# Patient Record
Sex: Male | Born: 1985
Health system: Southern US, Community
[De-identification: ages and names within clinical notes are randomized; demographics above are authoritative.]

## PROBLEM LIST (undated history)

## (undated) DIAGNOSIS — F1910 Other psychoactive substance abuse, uncomplicated: Secondary | ICD-10-CM

## (undated) DIAGNOSIS — F419 Anxiety disorder, unspecified: Secondary | ICD-10-CM

## (undated) HISTORY — DX: Anxiety disorder, unspecified: F41.9

## (undated) HISTORY — DX: Other psychoactive substance abuse, uncomplicated: F19.10

---

## 2010-05-23 ENCOUNTER — Encounter: Admission: RE | Admit: 2010-05-23 | Discharge: 2010-05-23 | Payer: Self-pay | Admitting: Family Medicine

## 2010-06-30 ENCOUNTER — Encounter: Admission: RE | Admit: 2010-06-30 | Discharge: 2010-06-30 | Payer: Self-pay | Admitting: Family Medicine

## 2016-05-19 ENCOUNTER — Ambulatory Visit (INDEPENDENT_AMBULATORY_CARE_PROVIDER_SITE_OTHER): Payer: BLUE CROSS/BLUE SHIELD | Admitting: Physician Assistant

## 2016-05-19 VITALS — BP 120/80 | HR 94 | Temp 98.5°F | Resp 18 | Ht 68.5 in | Wt 161.0 lb

## 2016-05-19 DIAGNOSIS — Z1389 Encounter for screening for other disorder: Secondary | ICD-10-CM | POA: Diagnosis not present

## 2016-05-19 DIAGNOSIS — Z130 Encounter for screening for diseases of the blood and blood-forming organs and certain disorders involving the immune mechanism: Secondary | ICD-10-CM

## 2016-05-19 DIAGNOSIS — Z0000 Encounter for general adult medical examination without abnormal findings: Secondary | ICD-10-CM

## 2016-05-19 DIAGNOSIS — Z114 Encounter for screening for human immunodeficiency virus [HIV]: Secondary | ICD-10-CM | POA: Diagnosis not present

## 2016-05-19 DIAGNOSIS — Z1159 Encounter for screening for other viral diseases: Secondary | ICD-10-CM | POA: Diagnosis not present

## 2016-05-19 NOTE — Progress Notes (Signed)
   05/19/2016 5:45 PM   DOB: 1986-03-27 / MRN: [id number]  SUBJECTIVE:  Staci AcostaBenjamin V Majer is a 30 y.o. male presenting for an annual physical.  He has a history of IV drug use and is under buprenorphine therapy at this time. He would like his labs forwarded to Dr. Chandra BatchJoe Orsini. He has never been tested for HIV.   He has No Known Allergies.   He  has a past medical history of Anxiety and Substance abuse.    He  reports that he has never smoked. He does not have any smokeless tobacco history on file. He reports that he drinks alcohol. He reports that he does not use illicit drugs. He  has no sexual activity history on file. The patient  has no past surgical history on file.  His family history is not on file.  Review of Systems  Constitutional: Negative for fever and chills.  Gastrointestinal: Negative for nausea.  Skin: Negative for itching and rash.  Neurological: Negative for dizziness and headaches.    Problem list and medications reviewed and updated by myself where necessary, and exist elsewhere in the encounter.   OBJECTIVE:  BP 120/80 mmHg  Pulse 94  Temp(Src) 98.5 F (36.9 C) (Oral)  Resp 18  Ht 5' 8.5" (1.74 m)  Wt 161 lb (73.029 kg)  BMI 24.12 kg/m2  SpO2 99%  Physical Exam  Constitutional: He is oriented to person, place, and time. He appears well-developed. He does not appear ill.  Eyes: Conjunctivae and EOM are normal. Pupils are equal, round, and reactive to light.  Cardiovascular: Normal rate.   Pulmonary/Chest: Effort normal.  Abdominal: He exhibits no distension.  Musculoskeletal: Normal range of motion.  Neurological: He is alert and oriented to person, place, and time. No cranial nerve deficit. Coordination normal.  Skin: Skin is warm and dry. He is not diaphoretic.  Psychiatric: He has a normal mood and affect.  Nursing note and vitals reviewed.   No results found for this or any previous visit (from the past 72 hour(s)).  No results  found.  ASSESSMENT AND PLAN  Sharlet SalinaBenjamin was seen today for annual exam.  Diagnoses and all orders for this visit:  Annual physical exam: He declines the TD. He would like his labs forwarded to Dr. Chandra BatchJoe Orsini at fax [phone]   Screening for HIV (human immunodeficiency virus) -     HIV antibody  Need for hepatitis C screening test -     Hepatitis C antibody  Need for hepatitis B screening test -     Hepatitis B surface antigen  Screening for deficiency anemia -     CBC  Screening for nephropathy -     COMPLETE METABOLIC PANEL WITH GFR   The patient was advised to call or return to clinic if he does not see an improvement in symptoms, or to seek the care of the closest emergency department if he worsens with the above plan.   Deliah BostonMichael Clark, MHS, PA-C [facility] 05/19/2016 5:45 PM

## 2016-05-19 NOTE — Patient Instructions (Signed)
     IF you received an x-ray today, you will receive an invoice from [facility]. Please contact [facility] at [phone] with questions or concerns regarding your invoice.   IF you received labwork today, you will receive an invoice from [facility]. Please contact [facility] at [phone] with questions or concerns regarding your invoice.   Our billing staff will not be able to assist you with questions regarding bills from these companies.  You will be contacted with the lab results as soon as they are available. The fastest way to get your results is to activate your My Chart account. Instructions are located on the last page of this paperwork. If you have not heard from us regarding the results in 2 weeks, please contact this office.      

## 2016-05-20 LAB — COMPLETE METABOLIC PANEL WITH GFR
ALBUMIN: 4.6 g/dL (ref 3.6–5.1)
ALK PHOS: 75 U/L (ref 40–115)
ALT: 17 U/L (ref 9–46)
AST: 19 U/L (ref 10–40)
BILIRUBIN TOTAL: 0.4 mg/dL (ref 0.2–1.2)
BUN: 16 mg/dL (ref 7–25)
CALCIUM: 9.4 mg/dL (ref 8.6–10.3)
CO2: 27 mmol/L (ref 20–31)
Chloride: 101 mmol/L (ref 98–110)
Creat: 0.92 mg/dL (ref 0.60–1.35)
GFR, Est African American: >89 mL/min (ref >=60)
Glucose, Bld: 105 mg/dL — ABNORMAL HIGH (ref 65–99)
POTASSIUM: 4.3 mmol/L (ref 3.5–5.3)
Sodium: 140 mmol/L (ref 135–146)
Total Protein: 7.4 g/dL (ref 6.1–8.1)

## 2016-05-20 LAB — HIV ANTIBODY (ROUTINE TESTING W REFLEX): HIV 1&2 Ab, 4th Generation: NONREACTIVE

## 2016-05-20 LAB — CBC
HEMATOCRIT: 41.6 % (ref 38.5–50.0)
HEMOGLOBIN: 14.4 g/dL (ref 13.2–17.1)
MCH: 32.1 pg (ref 27.0–33.0)
MCHC: 34.6 g/dL (ref 32.0–36.0)
MCV: 92.7 fL (ref 80.0–100.0)
MPV: 10.3 fL (ref 7.5–12.5)
Platelets: 221 10*3/uL (ref 140–400)
RBC: 4.49 MIL/uL (ref 4.20–5.80)
RDW: 13.3 % (ref 11.0–15.0)
WBC: 7.3 10*3/uL (ref 3.8–10.8)

## 2016-05-20 LAB — HEPATITIS C ANTIBODY: HCV Ab: NEGATIVE

## 2016-05-20 LAB — HEPATITIS B SURFACE ANTIGEN: Hepatitis B Surface Ag: NEGATIVE

## 2016-05-27 ENCOUNTER — Encounter: Payer: Self-pay | Admitting: *Deleted

## 2016-07-30 DIAGNOSIS — F410 Panic disorder [episodic paroxysmal anxiety] without agoraphobia: Secondary | ICD-10-CM | POA: Diagnosis not present

## 2016-07-30 DIAGNOSIS — F900 Attention-deficit hyperactivity disorder, predominantly inattentive type: Secondary | ICD-10-CM | POA: Diagnosis not present

## 2017-01-21 DIAGNOSIS — F900 Attention-deficit hyperactivity disorder, predominantly inattentive type: Secondary | ICD-10-CM | POA: Diagnosis not present

## 2017-01-21 DIAGNOSIS — F411 Generalized anxiety disorder: Secondary | ICD-10-CM | POA: Diagnosis not present

## 2017-07-23 DIAGNOSIS — F900 Attention-deficit hyperactivity disorder, predominantly inattentive type: Secondary | ICD-10-CM | POA: Diagnosis not present

## 2017-12-08 DIAGNOSIS — M9903 Segmental and somatic dysfunction of lumbar region: Secondary | ICD-10-CM | POA: Diagnosis not present

## 2017-12-08 DIAGNOSIS — M9901 Segmental and somatic dysfunction of cervical region: Secondary | ICD-10-CM | POA: Diagnosis not present

## 2017-12-08 DIAGNOSIS — M62830 Muscle spasm of back: Secondary | ICD-10-CM | POA: Diagnosis not present

## 2017-12-08 DIAGNOSIS — M9905 Segmental and somatic dysfunction of pelvic region: Secondary | ICD-10-CM | POA: Diagnosis not present

## 2017-12-08 DIAGNOSIS — M9902 Segmental and somatic dysfunction of thoracic region: Secondary | ICD-10-CM | POA: Diagnosis not present

## 2017-12-10 DIAGNOSIS — M9901 Segmental and somatic dysfunction of cervical region: Secondary | ICD-10-CM | POA: Diagnosis not present

## 2017-12-10 DIAGNOSIS — M9905 Segmental and somatic dysfunction of pelvic region: Secondary | ICD-10-CM | POA: Diagnosis not present

## 2017-12-10 DIAGNOSIS — M9902 Segmental and somatic dysfunction of thoracic region: Secondary | ICD-10-CM | POA: Diagnosis not present

## 2017-12-10 DIAGNOSIS — M9903 Segmental and somatic dysfunction of lumbar region: Secondary | ICD-10-CM | POA: Diagnosis not present

## 2018-01-14 DIAGNOSIS — F900 Attention-deficit hyperactivity disorder, predominantly inattentive type: Secondary | ICD-10-CM | POA: Diagnosis not present

## 2018-01-14 DIAGNOSIS — F4010 Social phobia, unspecified: Secondary | ICD-10-CM | POA: Diagnosis not present

## 2018-01-21 DIAGNOSIS — M9902 Segmental and somatic dysfunction of thoracic region: Secondary | ICD-10-CM | POA: Diagnosis not present

## 2018-01-21 DIAGNOSIS — M9903 Segmental and somatic dysfunction of lumbar region: Secondary | ICD-10-CM | POA: Diagnosis not present

## 2018-01-21 DIAGNOSIS — M9905 Segmental and somatic dysfunction of pelvic region: Secondary | ICD-10-CM | POA: Diagnosis not present

## 2018-01-21 DIAGNOSIS — M9901 Segmental and somatic dysfunction of cervical region: Secondary | ICD-10-CM | POA: Diagnosis not present

## 2018-01-31 DIAGNOSIS — M9902 Segmental and somatic dysfunction of thoracic region: Secondary | ICD-10-CM | POA: Diagnosis not present

## 2018-01-31 DIAGNOSIS — M9903 Segmental and somatic dysfunction of lumbar region: Secondary | ICD-10-CM | POA: Diagnosis not present

## 2018-01-31 DIAGNOSIS — M9901 Segmental and somatic dysfunction of cervical region: Secondary | ICD-10-CM | POA: Diagnosis not present

## 2018-01-31 DIAGNOSIS — M9905 Segmental and somatic dysfunction of pelvic region: Secondary | ICD-10-CM | POA: Diagnosis not present

## 2018-02-07 DIAGNOSIS — M9905 Segmental and somatic dysfunction of pelvic region: Secondary | ICD-10-CM | POA: Diagnosis not present

## 2018-02-07 DIAGNOSIS — M9901 Segmental and somatic dysfunction of cervical region: Secondary | ICD-10-CM | POA: Diagnosis not present

## 2018-02-07 DIAGNOSIS — M9903 Segmental and somatic dysfunction of lumbar region: Secondary | ICD-10-CM | POA: Diagnosis not present

## 2018-02-07 DIAGNOSIS — M9902 Segmental and somatic dysfunction of thoracic region: Secondary | ICD-10-CM | POA: Diagnosis not present

## 2018-03-31 ENCOUNTER — Ambulatory Visit: Payer: BLUE CROSS/BLUE SHIELD | Admitting: Family Medicine

## 2018-03-31 ENCOUNTER — Other Ambulatory Visit: Payer: Self-pay

## 2018-03-31 ENCOUNTER — Encounter (HOSPITAL_COMMUNITY): Payer: Self-pay | Admitting: Emergency Medicine

## 2018-03-31 ENCOUNTER — Emergency Department (HOSPITAL_COMMUNITY)
Admission: EM | Admit: 2018-03-31 | Discharge: 2018-03-31 | Disposition: A | Discharge status: Home / Self Care | Payer: BLUE CROSS/BLUE SHIELD | Attending: Emergency Medicine | Admitting: Emergency Medicine

## 2018-03-31 ENCOUNTER — Encounter: Payer: Self-pay | Admitting: Family Medicine

## 2018-03-31 VITALS — BP 128/82 | HR 73 | Temp 98.4°F | Resp 18 | Ht 68.5 in | Wt 150.0 lb

## 2018-03-31 DIAGNOSIS — F0781 Postconcussional syndrome: Secondary | ICD-10-CM | POA: Diagnosis not present

## 2018-03-31 DIAGNOSIS — H8112 Benign paroxysmal vertigo, left ear: Secondary | ICD-10-CM | POA: Diagnosis not present

## 2018-03-31 DIAGNOSIS — F17200 Nicotine dependence, unspecified, uncomplicated: Secondary | ICD-10-CM

## 2018-03-31 DIAGNOSIS — Z79899 Other long term (current) drug therapy: Secondary | ICD-10-CM | POA: Insufficient documentation

## 2018-03-31 DIAGNOSIS — R51 Headache: Secondary | ICD-10-CM | POA: Diagnosis not present

## 2018-03-31 DIAGNOSIS — S069X0A Unspecified intracranial injury without loss of consciousness, initial encounter: Secondary | ICD-10-CM

## 2018-03-31 DIAGNOSIS — H8111 Benign paroxysmal vertigo, right ear: Secondary | ICD-10-CM | POA: Diagnosis not present

## 2018-03-31 DIAGNOSIS — G44319 Acute post-traumatic headache, not intractable: Secondary | ICD-10-CM | POA: Diagnosis not present

## 2018-03-31 DIAGNOSIS — G44309 Post-traumatic headache, unspecified, not intractable: Secondary | ICD-10-CM | POA: Diagnosis not present

## 2018-03-31 DIAGNOSIS — S0990XA Unspecified injury of head, initial encounter: Secondary | ICD-10-CM | POA: Diagnosis not present

## 2018-03-31 DIAGNOSIS — R29818 Other symptoms and signs involving the nervous system: Secondary | ICD-10-CM

## 2018-03-31 DIAGNOSIS — R0981 Nasal congestion: Secondary | ICD-10-CM | POA: Diagnosis not present

## 2018-03-31 MED ORDER — PROMETHAZINE HCL 25 MG PO TABS: 25.0000 mg | ORAL_TABLET | Freq: Three times a day (TID) | ORAL | 0 refills | Status: DC | PRN

## 2018-03-31 MED ORDER — MECLIZINE HCL 25 MG PO TABS
25.0000 mg | ORAL_TABLET | Freq: Once | ORAL | Status: AC
  Administered 2018-03-31: 25 mg via ORAL
  Filled 2018-03-31: qty 1

## 2018-03-31 MED ORDER — AMITRIPTYLINE HCL 25 MG PO TABS: 25.0000 mg | ORAL_TABLET | Freq: Every day | ORAL | 0 refills | Status: DC

## 2018-03-31 MED ORDER — MOMETASONE FUROATE 50 MCG/ACT NA SUSP: 2.0000 | Freq: Every day | NASAL | 1 refills | Status: DC

## 2018-03-31 MED ORDER — MECLIZINE HCL 25 MG PO TABS: 25.0000 mg | ORAL_TABLET | Freq: Three times a day (TID) | ORAL | 0 refills | Status: DC

## 2018-03-31 MED ORDER — IBUPROFEN 800 MG PO TABS: 800.0000 mg | ORAL_TABLET | Freq: Three times a day (TID) | ORAL | 0 refills | Status: DC

## 2018-03-31 MED ORDER — ONDANSETRON 4 MG PO TBDP: 4.0000 mg | ORAL_TABLET | Freq: Three times a day (TID) | ORAL | 0 refills | Status: DC | PRN

## 2018-03-31 MED ORDER — AMITRIPTYLINE HCL 25 MG PO TABS: 25.0000 mg | ORAL_TABLET | Freq: Every day | ORAL | 1 refills | Status: DC

## 2018-03-31 MED ORDER — MECLIZINE HCL 25 MG PO TABS: 25.0000 mg | ORAL_TABLET | Freq: Three times a day (TID) | ORAL | 0 refills | Status: DC | PRN

## 2018-03-31 MED ORDER — IBUPROFEN 800 MG PO TABS
800.0000 mg | ORAL_TABLET | Freq: Once | ORAL | Status: AC
  Administered 2018-03-31: 800 mg via ORAL
  Filled 2018-03-31: qty 1

## 2018-03-31 MED ORDER — ONDANSETRON 4 MG PO TBDP
4.0000 mg | ORAL_TABLET | Freq: Once | ORAL | Status: AC
Start: 2018-03-31 — End: 2018-03-31
  Administered 2018-03-31: 4 mg via ORAL
  Filled 2018-03-31: qty 1

## 2018-03-31 NOTE — ED Provider Notes (Signed)
[facility]-EMERGENCY DEPT Provider Note   CSN: [id number] Arrival date & time: 03/31/18  2219     History   Chief Complaint Chief Complaint  Patient presents with  . Head Injury    HPI COWAN PILAR is a 32 y.o. male.  The history is provided by the patient, a friend and medical records.  Head Injury       57 y.o. M with hx of anxiety and substance abuse, presenting to the ED for ongoing headaches.  Patient states he had a head injury that occurred approx 3 weeks ago where he slipped on a microphone and struck the right side of his head on the hardwood floor.  No LOC.  States since that time he has not been "right".  States he has been having ongoing headaches, usually generalized, waxing and waning in severity.  States he has been experiencing a lot of intermittent vertigo as well, notably if bending forward where his head is hanging down.  States the room starts spinning and he gets very nauseated.  He has not had any vomiting.  States he has had trouble focusing-- has been more forgetful than normal (ie-- misplacing his keys, phone, forgetting details, etc).  States his family has been extremely worried about him which is causing some increased stress.  This in turn is making his anxiety worse, therefore he has not been sleeping well.  He is not currently on anticoagulation.  He denies any focal numbness/weakness, neck pain, fever, chills, sweats, difficulty walking, slurred speech, facial droop, etc.  He has been able to intermittently work (mows lawns), however has still had to miss several days of work.  States he is only frustrated because his symptoms come in waves (ie-- one day he is fine and able to play hockey, next day he feels bad again).  He did see his PCP ealrier today who wrote him prescriptions for symptomatic control, however did not get to the pharmacy in time to pick them up.  Past Medical History:  Diagnosis Date  . Anxiety   . Substance  abuse (HCC)     There are no active problems to display for this patient.   History reviewed. No pertinent surgical history.      Home Medications    Prior to Admission medications   Medication Sig Start Date End Date Taking? Authorizing Provider  buprenorphine-naloxone (SUBOXONE) 8-2 MG SUBL SL tablet Place 1 tablet under the tongue daily.   Yes [provider]  amitriptyline (ELAVIL) 25 MG tablet Take 1 tablet (25 mg total) by mouth at bedtime. 03/31/18   Sherren Mocha, MD  meclizine (ANTIVERT) 25 MG tablet Take 1 tablet (25 mg total) by mouth 3 (three) times daily. 03/31/18   Sherren Mocha, MD  mometasone (NASONEX) 50 MCG/ACT nasal spray Place 2 sprays into the nose daily. 03/31/18   Sherren Mocha, MD  promethazine (PHENERGAN) 25 MG tablet Take 1 tablet (25 mg total) by mouth every 8 (eight) hours as needed for nausea or vomiting. 03/31/18   Sherren Mocha, MD    Family History No family history on file.  Social History Social History   Tobacco Use  . Smoking status: Never Smoker  . Smokeless tobacco: Never Used  Substance Use Topics  . Alcohol use: Yes    Alcohol/week: 0.0 oz  . Drug use: No     Allergies   Patient has no known allergies.   Review of Systems Review of Systems  Neurological: Positive for dizziness and headaches.  All other systems reviewed and are negative.    Physical Exam Updated Vital Signs BP (!) 149/118 (BP Location: Right Arm)   Pulse 77   Temp 98.2 F (36.8 C) (Oral)   Resp 20   SpO2 100%   Physical Exam  Constitutional: He is oriented to person, place, and time. He appears well-developed and well-nourished. No distress.  HENT:  Head: Normocephalic and atraumatic.  Right Ear: Tympanic membrane and external ear normal.  Left Ear: Tympanic membrane and external ear normal.  Nose: Nose normal.  Mouth/Throat: Uvula is midline, oropharynx is clear and moist and mucous membranes are normal.  Small area of contusion to right  forehead/temple; no skull depression noted; no hemotympanum; no facial deformities  Eyes: Pupils are equal, round, and reactive to light. Conjunctivae and EOM are normal.  Pupils dilated but symmetric and reactive bilaterally  Neck: Normal range of motion and full passive range of motion without pain. Neck supple. No neck rigidity.  No rigidity, no meningismus  Cardiovascular: Normal rate, regular rhythm and normal heart sounds.  No murmur heard. Pulmonary/Chest: Effort normal and breath sounds normal. No respiratory distress. He has no wheezes. He has no rhonchi.  Abdominal: Soft. Bowel sounds are normal. There is no tenderness. There is no guarding.  Musculoskeletal: Normal range of motion. He exhibits no edema.  Neurological: He is alert and oriented to person, place, and time. He has normal strength. He displays no tremor. No cranial nerve deficit or sensory deficit. He displays no seizure activity.  AAOx3, answering questions and following commands appropriately; equal strength UE and LE bilaterally; CN grossly intact; moves all extremities appropriately without ataxia; no focal neuro deficits or facial asymmetry appreciated; speech is clear and goal oriented  Skin: Skin is warm and dry. No rash noted. He is not diaphoretic.  Psychiatric: He has a normal mood and affect. His behavior is normal. Thought content normal.  Nursing note and vitals reviewed.    ED Treatments / Results  Labs (all labs ordered are listed, but only abnormal results are displayed) Labs Reviewed - No data to display  EKG None  Radiology No results found.  Procedures Procedures (including critical care time)  Medications Ordered in ED Medications - No data to display   Initial Impression / Assessment and Plan / ED Course  I have reviewed the triage vital signs and the nursing notes.  Pertinent labs & imaging results that were available during my care of the patient were reviewed by me and considered  in my medical decision making (see chart for details).  32 year old male presenting to the ED following head injury that occurred 3 weeks ago.  Has been having intermittent symptoms since then.  Seen by PCP earlier today and written for prescriptions and outpatient MRI was ordered, however did not make it to the pharmacy in time.  Patient is awake, alert, fully oriented here.  He does not have any focal neurologic deficits.  He has small area of contusion to the right forehead/temple but no skull depression or hematoma.  He has no other signs of trauma.  Discussed with patient that this is likely postconcussive syndrome and he agrees.  I have offered him screening head CT, however he would prefer symptomatic treatment which is completely reasonable.  Is he cannot pick up his prescriptions at this time, I have medicated him here and written prescriptions that he can get filled tonight at 24-hour pharmacy.  He understands  to follow-up closely with his primary care doctor.  He will return here for any new or acute changes.  Final Clinical Impressions(s) / ED Diagnoses   Final diagnoses:  Post concussive syndrome    ED Discharge Orders        Ordered    ibuprofen (ADVIL,MOTRIN) 800 MG tablet  3 times daily     03/31/18 2330    ondansetron (ZOFRAN ODT) 4 MG disintegrating tablet  Every 8 hours PRN     03/31/18 2330    meclizine (ANTIVERT) 25 MG tablet  3 times daily PRN     03/31/18 2330       Garlon Hatchet, PA-C 03/31/18 2340    Gwyneth Sprout, MD 04/04/18 2329

## 2018-03-31 NOTE — Progress Notes (Addendum)
Subjective:  By signing my name below, I, Maurice [name], attest that this documentation has been prepared under the direction and in the presence of Maurice Sorenson, MD. Electronically Signed: Stann [name], Scribe. 03/31/2018 , 3:17 PM .  Patient was seen in Room 2 .   Patient ID: Maurice [name], male    DOB: 02-11-1986, 32 y.o.   MRN: [id number] Chief Complaint  Patient presents with  . Fall    x1 month, right side of the face, Pt states he experiening some fatigue, dizziness, nausea, no blurred vision, throbbing on the top of his head. Pt states he was getting out of bed and stepped on a microphone and it made him fal and hit his head on the floor and the bed frame. Pt states he had scratches on his face that have healed. Pt states he didn't loose conscious.     HPI Maurice [name] is a 32 y.o. male who presents to Primary Care at [facility] complaining of fall which occurred about 1 month ago. Patient states he turned over in bed, stepped down to get out of bed, but stepped onto a microphone, causing him to slip and hit the right side of his face onto the metal frame of the bed, and then head hitting the floor. He initially had scratches over his face. He's felt nauseated with room spinning when laying down. He took a week off of work, and gradually gotten better. But after returning to work, he's had good days, but other days worse. He reports right side of his head throbbing with pain and swelling all day for a couple weeks now. When he gets out of bed too fast, he feels unsteady like he's going to faint. He's had memory issues, noticed by his family. His worst symptoms are the throbbing pain over his right side of his head, and the vertigo. He hasn't been able to sleep well at night due to room spinning. When the room is spinning, his vision becomes blurry. He has some pain in his right ear, and behind his right ear. He denies LOC. He had a history of a concussion in high school, while playing  lacrosse. 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 He denies taking OTC medication for this. He was having some trouble walking into the walls but this has improved. He has history of seasonal allergies with sinus pressure. He has been anxious and lack of control of his emotions recently. He hasn't taken anything for his headache. He's been having more anxiety and takes xanax as needed for panic attacks. He also takes Adderall XR  in the morning the Adderall IR in the evening, though not recently. He is also on suboxone  for about a month now, previously taking  QD for the past 2 years. He's taken some zyrtec, and OTC Vick's nasal spray for seasonal allergies.   He does smoke, but has quit about 1-2 weeks ago.   Past Medical History:  Diagnosis Date  . Anxiety   . Substance abuse (HCC)    History reviewed. No pertinent surgical history. Prior to Admission medications   Medication Sig Start Date End Date Taking? Authorizing Provider  alprazolam Prudy Feeler) 2 MG tablet Take 2 mg by mouth at bedtime as needed for sleep.    [provider]  amphetamine-dextroamphetamine (ADDERALL XR) 30 MG 24 hr capsule Take 30 mg by mouth daily.    [provider]  amphetamine-dextroamphetamine (ADDERALL) 20 MG tablet  Take 20 mg by mouth daily.    [provider]  buprenorphine-naloxone (SUBOXONE) 8-2 MG SUBL SL tablet Place 1 tablet under the tongue daily.    [provider]   No Known Allergies History reviewed. No pertinent family history. Social History   Socioeconomic History  . Marital status: Single    Spouse name: Not on file  . Number of children: Not on file  . Years of education: Not on file  . Highest education level: Not on file  Occupational History  . Not on file  Social Needs  . Financial resource strain: Not on file  . Food insecurity:    Worry: Not on file    Inability: Not on file  . Transportation needs:    Medical: Not on  file    Non-medical: Not on file  Tobacco Use  . Smoking status: Never Smoker  . Smokeless tobacco: Never Used  Substance and Sexual Activity  . Alcohol use: Yes    Alcohol/week: 0.0 oz  . Drug use: No  . Sexual activity: Not on file  Lifestyle  . Physical activity:    Days per week: Not on file    Minutes per session: Not on file  . Stress: Not on file  Relationships  . Social connections:    Talks on phone: Not on file    Gets together: Not on file    Attends religious service: Not on file    Active member of club or organization: Not on file    Attends meetings of clubs or organizations: Not on file    Relationship status: Not on file  Other Topics Concern  . Not on file  Social History Narrative  . Not on file   Depression screen [facility] 2/9 03/31/2018  Decreased Interest 0  Down, Depressed, Hopeless 0  PHQ - 2 Score 0     Review of Systems  Constitutional: Positive for fatigue. Negative for unexpected weight change.  Eyes: Positive for visual disturbance.  Respiratory: Negative for cough, chest tightness and shortness of breath.   Cardiovascular: Negative for chest pain, palpitations and leg swelling.  Gastrointestinal: Positive for nausea. Negative for abdominal pain and blood in stool.  Allergic/Immunologic: Positive for environmental allergies.  Neurological: Positive for dizziness. Negative for light-headedness and headaches.       Objective:   Physical Exam  Constitutional: He is oriented to person, place, and time. He appears well-developed and well-nourished. No distress.  HENT:  Head: Normocephalic and atraumatic.  Right Ear: Tympanic membrane is injected and retracted.  Left Ear: Tympanic membrane is injected and retracted.  Nose: Rhinorrhea (with erythema) present.  Postnasal drip seen No instability from skull, question of slight elevation and warmth over the right parietal, no fluctuance of maxillary process, 2+ temporal arteries, no instability of  orbital ridges; hypersensitivity over the right side of face  Eyes: Pupils are equal, round, and reactive to light. EOM are normal. Right eye exhibits no nystagmus.  Positive dix-hallpike on left  Neck: Neck supple. No thyromegaly present.  Cardiovascular: Normal rate, regular rhythm and normal heart sounds.  Pulmonary/Chest: Effort normal and breath sounds normal. No respiratory distress.  Musculoskeletal: Normal range of motion.  Lymphadenopathy:    He has no cervical adenopathy.  Neurological: He is alert and oriented to person, place, and time. No cranial nerve deficit. He displays a negative Romberg sign.  Reflex Scores:      Tricep reflexes are 2+ on the right side and 2+ on  the left side.      Bicep reflexes are 2+ on the right side and 2+ on the left side.      Brachioradialis reflexes are 2+ on the right side and 2+ on the left side.      Patellar reflexes are 2+ on the right side and 2+ on the left side.      Achilles reflexes are 2+ on the right side and 2+ on the left side. No pronator drift, symptoms illicit after left cervical rotation, normal rapid alternating hands, normal finger-to-nose, normal heel-to-shin, normal tandem gait, normal balance and 1 legged stance  Skin: Skin is warm and dry.  Psychiatric: He has a normal mood and affect. His behavior is normal.  Nursing note and vitals reviewed.   BP 128/82 (BP Location: Left Arm, Patient Position: Sitting, Cuff Size: Normal)   Pulse 73   Temp 98.4 F (36.9 C) (Oral)   Resp 18   Ht 5' 8.5" (1.74 m)   Wt 150 lb (68 kg)   SpO2 99%   BMI 22.48 kg/m      Assessment & Plan:   1. Injury of head, initial encounter   2. Traumatic brain injury, without loss of consciousness, initial encounter (HCC)   3. Acute post-traumatic headache, not intractable   4. Other symptoms and signs involving the nervous system   5. Tobacco use disorder   6. Benign paroxysmal positional vertigo of right ear   7. BPPV (benign paroxysmal  positional vertigo), left   8. Sinus congestion    Recommended labs to look for other secondary causes of his sxs - cbc, cmp, tsh - pt refused blood draw despite explanation on why this information could be useful in his treatment.  Orders Placed This Encounter  Procedures  . MR Brain W Wo Contrast    Standing Status:   Future    Standing Expiration Date:   06/01/2019    Order Specific Question:   If indicated for the ordered procedure, I authorize the administration of contrast media per Radiology protocol    Answer:   Yes    Order Specific Question:   What is the patient's sedation requirement?    Answer:   No Sedation    Order Specific Question:   Does the patient have a pacemaker or implanted devices?    Answer:   No    Order Specific Question:   Radiology Contrast Protocol - do NOT remove file path    Answer:   [email or web address]    Order Specific Question:   Preferred imaging location?    Answer:   [facility] (table limit 350lbs)  . TSH  . Comprehensive metabolic panel  . POCT CBC    Meds ordered this encounter  Medications  . amitriptyline (ELAVIL) 25 MG tablet    Sig: Take 1 tablet (25 mg total) by mouth at bedtime.    Dispense:  30 tablet    Refill:  1  . meclizine (ANTIVERT) 25 MG tablet    Sig: Take 1 tablet (25 mg total) by mouth 3 (three) times daily.    Dispense:  90 tablet    Refill:  0  . promethazine (PHENERGAN) 25 MG tablet    Sig: Take 1 tablet (25 mg total) by mouth every 8 (eight) hours as needed for nausea or vomiting.    Dispense:  20 tablet    Refill:  0  . mometasone (NASONEX) 50 MCG/ACT nasal spray    Sig: Place 2  sprays into the nose daily.    Dispense:  17 g    Refill:  1    I personally performed the services described in this documentation, which was scribed in my presence. The recorded information has been reviewed and considered, and addended by me as needed.   Maurice [name], M.D.  Primary Care at [facility] [address] [place], [place] [address] [phone] phone [phone] fax  04/05/18 2:28 PM

## 2018-03-31 NOTE — Discharge Instructions (Signed)
Take the prescribed medication as directed. °Follow-up with your primary care doctor. °Return to the ED for new or worsening symptoms. °

## 2018-03-31 NOTE — ED Triage Notes (Signed)
Patient BIB family, reports fall and hitting head on floor x3 weeks ago. Patient c/o persistent headache since that time. Denies LOC. A&Ox4, but family reports confusion. Seen today for same but reports he was unable to get prescriptions filled.

## 2018-03-31 NOTE — Patient Instructions (Addendum)
IF you received an x-ray today, you will receive an invoice from [facility]. Please contact [facility] at [phone] with questions or concerns regarding your invoice.   IF you received labwork today, you will receive an invoice from [place]. Please contact [facility] at [phone] with questions or concerns regarding your invoice.   Our billing staff will not be able to assist you with questions regarding bills from these companies.  You will be contacted with the lab results as soon as they are available. The fastest way to get your results is to activate your My Chart account. Instructions are located on the last page of this paperwork. If you have not heard from [place] regarding the results in 2 weeks, please contact this office.     Traumatic Brain Injury Traumatic brain injury (TBI) is an injury to your brain from a blow to your head (closed injury) or an object penetrating your skull and entering your brain (open injury). The severity of TBI varies significantly from one person to the next. Some TBIs cause you to pass out (lose consciousness) immediately and for a long period of time. Other TBIs do not cause any loss of consciousness. Symptoms of any type of TBI can be long lasting (chronic). TBI can interfere with memory and speech. TBI can also cause chronic symptoms like headache or dizziness. What are the causes? TBI is caused by a closed or open injury. What increases the risk? You may be at higher risk for TBI if you:  Are 75 or older.  Are a man.  Are in a car accident.  Play a contact sport, especially football, hockey, or soccer.  Do not wear protective gear while playing sports.  Are in the [employer].  Are a victim of violence.  Abuse drugs or alcohol.  Have had a previous TBI.  What are the signs or symptoms? Signs and symptoms of TBI may occur right away or not until days, weeks, or months after the injury. They may last for days,  weeks, months, or years. Symptoms may include:  Loss of consciousness.  Headache.  Confusion.  Fatigue.  Changes in sleep.  Dizziness.  Mood or personality changes.  Memory problems.  Nausea or vomiting or both.  Seizures.  Clumsiness.  Slurred speech.  Depression and anxiety.  Anger.  Inability to control emotions or actions (impulse control).  Loss of or dulling of your senses, such as hearing, vision, and touch. This can include: ? Blurred vision. ? Ringing in your ears.  How is this diagnosed? TBI may be diagnosed by a medical history and physical exam. Your health care provider will also do a neurologic exam to check your:  Reflexes.  Sensations.  Alertness.  Memory.  Vision.  Hearing.  Coordination.  Your health care provider will also do tests to diagnose the extent of your TBI, such as a CT scan of your brain and skull. One way to determine the severity of your TBI is with a scoring system called the Glasgow Coma Scale (GCS). It measures eye opening, motor response, and verbal response. The higher the score, the milder the TBI. Your TBI may be described as mild, moderate, or severe:  Mild TBI (concussion). ? Symptoms of mild TBI usually go away on their own. This can take weeks or months, depending on the type of concussion. ? Your GCS will be 13-15. ? Your brain CT scan will be normal. ? You may or may not have a short hospital  stay.  Moderate TBI. ? Your GCS will be 9-12. ? Your brain CT scan will be abnormal. ? You will likely need a short hospital stay.  Severe TBI. ? Your GCS will be 3-8. ? Your brain CT scan will be abnormal. ? You may need a long stay in the hospital.  How is this treated? Emergency treatment of TBI may involve measures to maintain a clear airway and stable blood pressure. Brain surgery may be needed to:  Remove a blood clot.  Repair bleeding.  Remove an object that has penetrated the brain, such as a skull  fragment or a bullet.  Other treatments depend on your chronic signs and symptoms. These treatments include the following types of therapy:  Physical.  Occupational.  Speech and language.  Mental health.  Social support.  Follow these instructions at home:  Carefully follow all your health care provider's instructions.  Work closely with all your therapists, if necessary.  Take medicines only as directed by your health care provider. Do not take aspirin or other anti-inflammatory medicines such as ibuprofen or naproxen unless approved by your health care provider.  Do not abuse illegal drugs.  Limit alcohol intake to no more than 1 drink per day for nonpregnant women and 2 drinks per day for men. One drink equals 12 ounces of beer, 5 ounces of wine, or 1 ounces of hard liquor.  Avoid any situation where there is potential for another head injury, such as football, hockey, soccer, basketball, martial arts, downhill snow sports, and horseback riding. Do not do these activities until your health care provider approves.  Rest. Rest helps the brain to heal. Make sure you: ? Get plenty of sleep at night. Avoid staying up late at night. ? Keep the same bedtime hours on weekends and weekdays. ? Rest during the day. Take daytime naps or rest breaks when you feel tired.  Avoid excessive visual stimulation while recovering from a TBI. This includes work on the computer, watching TV, and reading.  Try to avoid activities that cause physical or mental stress. Stay home from work or school as directed by your health care provider.  Make lists to plan your day and help your memory.  Do not drive, ride a bicycle, or operate heavy machinery until your health care provider approves.  Seek support from friends and family.  Keep all follow-up visits as directed by your health care provider. This is important.  Watch your symptoms and tell others to do the same. Complications sometimes occur  after a TBI. How is this prevented?  Wear a helmet while biking, skiing, skateboarding, skating, or doing similar activities. Wear your seatbelt while driving.  Do not abuse alcohol or drugs.  Do not drink and drive.  Prevent falls at home by: ? Removing clutter and tripping hazards, including loose rugs, from floors and stairways. ? Using grab bars in bathrooms and handrails by stairs. ? Placing nonslip mats on floors and in bathtubs. ? Improving lighting in dim areas. Contact a health care provider if: Seek medical care if you have any of the following symptoms for more than two weeks after your injury:  Chronic headaches.  Dizziness or balance problems.  Nausea.  Vision problems.  Increased sensitivity to noise or light.  Depression or mood swings.  Anxiety or irritability.  Memory problems.  Difficulty concentrating or paying attention.  Sleep problems.  Feeling tired all the time.  Get help right away if:  You have confusion or unusual  drowsiness.  It is difficult to wake you up.  You have nausea or persistent, forceful vomiting.  You feel like you are moving when you are not (vertigo). Your eyes may move rapidly back and forth.  You have convulsions or faint.  You have severe, persistent headaches that are not relieved by medicine.  You cannot use your arms or legs normally.  One of your pupils is larger than the other.  You have clear or bloody discharge from your nose or ears.  Your problems are getting worse, not better. This information is not intended to replace advice given to you by your health care provider. Make sure you discuss any questions you have with your health care provider. Document Released: 10/16/2002 Document Revised: 06/28/2016 Document Reviewed: 02/08/2014 Elsevier Interactive Patient Education  2018 [employer].  Post-Concussion Syndrome Post-concussion syndrome describes the symptoms that can occur after a head injury.  These symptoms can last from weeks to months. What are the causes? It is not clear why some head injuries cause post-concussion syndrome. It can occur whether your head injury was mild or severe and whether you were wearing head protection or not. What are the signs or symptoms?  Memory difficulties.  Dizziness.  Headaches.  Double vision or blurry vision.  Sensitivity to light.  Hearing difficulties.  Depression.  Tiredness.  Weakness.  Difficulty with concentration.  Difficulty sleeping or staying asleep.  Vomiting.  Poor balance or instability on your feet.  Slow reaction time.  Difficulty learning and remembering things you have heard. How is this diagnosed? There is no test to determine whether you have post-concussion syndrome. Your health care provider may order an imaging scan of your brain, such as a CT scan, to check for other problems that may be causing your symptoms (such as a severe injury inside your skull). How is this treated? Usually, these problems disappear over time without medical care. Your health care provider may prescribe medicine to help ease your symptoms. It is important to follow up with a neurologist to evaluate your recovery and address any lingering symptoms or issues. Follow these instructions at home:  Take medicines only as directed by your health care provider. Do not take aspirin. Aspirin can slow blood clotting.  Sleep with your head slightly elevated to help with headaches.  Avoid any situation where there is potential for another head injury. This includes football, hockey, soccer, basketball, martial arts, downhill snow sports, and horseback riding. Your condition will get worse every time you experience a concussion. You should avoid these activities until you are evaluated by the appropriate follow-up health care providers.  Keep all follow-up visits as directed by your health care provider. This is important. Contact a health  care provider if:  You have increased problems paying attention or concentrating.  You have increased difficulty remembering or learning new information.  You need more time to complete tasks or assignments than before.  You have increased irritability or decreased ability to cope with stress.  You have more symptoms than before. Seek medical care if you have any of the following symptoms for more than two weeks after your injury:  Lasting (chronic) headaches.  Dizziness or balance problems.  Nausea.  Vision problems.  Increased sensitivity to noise or light.  Depression or mood swings.  Anxiety or irritability.  Memory problems.  Difficulty concentrating or paying attention.  Sleep problems.  Feeling tired all the time.  Get help right away if:  You have confusion or unusual  drowsiness.  Others find it difficult to wake you up.  You have nausea or persistent, forceful vomiting.  You feel like you are moving when you are not (vertigo). Your eyes may move rapidly back and forth.  You have convulsions or faint.  You have severe, persistent headaches that are not relieved by medicine.  You cannot use your arms or legs normally.  One of your pupils is larger than the other.  You have clear or bloody discharge from your nose or ears.  Your problems are getting worse, not better. This information is not intended to replace advice given to you by your health care provider. Make sure you discuss any questions you have with your health care provider. Document Released: 04/17/2002 Document Revised: 05/15/2016 Document Reviewed: 01/31/2014 Elsevier Interactive Patient Education  2018 [employer]. Vertigo Vertigo is the feeling that you or your surroundings are moving when they are not. Vertigo can be dangerous if it occurs while you are doing something that could endanger you or others, such as driving. What are the causes? This condition is caused by a  disturbance in the signals that are sent by your body's sensory systems to your brain. Different causes of a disturbance can lead to vertigo, including:  Infections, especially in the inner ear.  A bad reaction to a drug, or misuse of alcohol and medicines.  Withdrawal from drugs or alcohol.  Quickly changing positions, as when lying down or rolling over in bed.  Migraine headaches.  Decreased blood flow to the brain.  Decreased blood pressure.  Increased pressure in the brain from a head or neck injury, stroke, infection, tumor, or bleeding.  Central nervous system disorders.  What are the signs or symptoms? Symptoms of this condition usually occur when you move your head or your eyes in different directions. Symptoms may start suddenly, and they usually last for less than a minute. Symptoms may include:  Loss of balance and falling.  Feeling like you are spinning or moving.  Feeling like your surroundings are spinning or moving.  Nausea and vomiting.  Blurred vision or double vision.  Difficulty hearing.  Slurred speech.  Dizziness.  Involuntary eye movement (nystagmus).  Symptoms can be mild and cause only slight annoyance, or they can be severe and interfere with daily life. Episodes of vertigo may return (recur) over time, and they are often triggered by certain movements. Symptoms may improve over time. How is this diagnosed? This condition may be diagnosed based on medical history and the quality of your nystagmus. Your health care provider may test your eye movements by asking you to quickly change positions to trigger the nystagmus. This may be called the Dix-Hallpike test, head thrust test, or roll test. You may be referred to a health care provider who specializes in ear, nose, and throat (ENT) problems (otolaryngologist) or a provider who specializes in disorders of the central nervous system (neurologist). You may have additional testing, including:  A  physical exam.  Blood tests.  MRI.  A CT scan.  An electrocardiogram (ECG). This records electrical activity in your heart.  An electroencephalogram (EEG). This records electrical activity in your brain.  Hearing tests.  How is this treated? Treatment for this condition depends on the cause and the severity of the symptoms. Treatment options include:  Medicines to treat nausea or vertigo. These are usually used for severe cases. Some medicines that are used to treat other conditions may also reduce or eliminate vertigo symptoms. These include: ?  Medicines that control allergies (antihistamines). ? Medicines that control seizures (anticonvulsants). ? Medicines that relieve depression (antidepressants). ? Medicines that relieve anxiety (sedatives).  Head movements to adjust your inner ear back to normal. If your vertigo is caused by an ear problem, your health care provider may recommend certain movements to correct the problem.  Surgery. This is rare.  Follow these instructions at home: Safety  Move slowly.Avoid sudden body or head movements.  Avoid driving.  Avoid operating heavy machinery.  Avoid doing any tasks that would cause danger to you or others if you would have a vertigo episode during the task.  If you have trouble walking or keeping your balance, try using a cane for stability. If you feel dizzy or unstable, sit down right away.  Return to your normal activities as told by your health care provider. Ask your health care provider what activities are safe for you. General instructions  Take over-the-counter and prescription medicines only as told by your health care provider.  Avoid certain positions or movements as told by your health care provider.  Drink enough fluid to keep your urine clear or pale yellow.  Keep all follow-up visits as told by your health care provider. This is important. Contact a health care provider if:  Your medicines do not  relieve your vertigo or they make it worse.  You have a fever.  Your condition gets worse or you develop new symptoms.  Your family or friends notice any behavioral changes.  Your nausea or vomiting gets worse.  You have numbness or a "pins and needles" sensation in part of your body. Get help right away if:  You have difficulty moving or speaking.  You are always dizzy.  You faint.  You develop severe headaches.  You have weakness in your hands, arms, or legs.  You have changes in your hearing or vision.  You develop a stiff neck.  You develop sensitivity to light. This information is not intended to replace advice given to you by your health care provider. Make sure you discuss any questions you have with your health care provider. Document Released: 08/05/2005 Document Revised: 04/08/2016 Document Reviewed: 02/18/2015 Elsevier Interactive Patient Education  [employer].

## 2018-04-01 ENCOUNTER — Emergency Department (HOSPITAL_COMMUNITY)
Admission: EM | Admit: 2018-04-01 | Discharge: 2018-04-02 | Disposition: A | Discharge status: Home / Self Care | Payer: BLUE CROSS/BLUE SHIELD | Attending: Emergency Medicine | Admitting: Emergency Medicine

## 2018-04-01 ENCOUNTER — Encounter (HOSPITAL_COMMUNITY): Payer: Self-pay | Admitting: Emergency Medicine

## 2018-04-01 ENCOUNTER — Emergency Department (HOSPITAL_COMMUNITY): Payer: BLUE CROSS/BLUE SHIELD

## 2018-04-01 DIAGNOSIS — F19980 Other psychoactive substance use, unspecified with psychoactive substance-induced anxiety disorder: Secondary | ICD-10-CM | POA: Diagnosis not present

## 2018-04-01 DIAGNOSIS — Z87820 Personal history of traumatic brain injury: Secondary | ICD-10-CM | POA: Insufficient documentation

## 2018-04-01 DIAGNOSIS — F1920 Other psychoactive substance dependence, uncomplicated: Secondary | ICD-10-CM

## 2018-04-01 DIAGNOSIS — S0990XA Unspecified injury of head, initial encounter: Secondary | ICD-10-CM | POA: Diagnosis not present

## 2018-04-01 DIAGNOSIS — R825 Elevated urine levels of drugs, medicaments and biological substances: Secondary | ICD-10-CM | POA: Diagnosis not present

## 2018-04-01 DIAGNOSIS — F22 Delusional disorders: Secondary | ICD-10-CM

## 2018-04-01 DIAGNOSIS — R4689 Other symptoms and signs involving appearance and behavior: Secondary | ICD-10-CM | POA: Diagnosis not present

## 2018-04-01 LAB — RAPID URINE DRUG SCREEN, HOSP PERFORMED
AMPHETAMINES: POSITIVE — AB
BARBITURATES: NOT DETECTED
BENZODIAZEPINES: POSITIVE — AB
Cocaine: NOT DETECTED
Opiates: NOT DETECTED
Tetrahydrocannabinol: POSITIVE — AB

## 2018-04-01 NOTE — ED Notes (Signed)
Per Hina PA, wait on protocol orders until patient is seen by EDP since no SI/HI or hallucinations.
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
## 2018-04-01 NOTE — ED Notes (Signed)
Bed: [id number] Expected date:  Expected time:  Means of arrival:  Comments: Hold for 30 

## 2018-04-01 NOTE — ED Notes (Signed)
Bed: WLPT4 Expected date:  Expected time:  Means of arrival:  Comments: 

## 2018-04-01 NOTE — ED Notes (Signed)
On admission to the TCU pt is calm and appears to be irritated. His mother is present and she is anxious.

## 2018-04-01 NOTE — ED Notes (Signed)
Writer attempted to collect blood work, pt refused, Environmental manager.
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
## 2018-04-01 NOTE — ED Notes (Signed)
Pt refused to have blood drawn 

## 2018-04-01 NOTE — ED Notes (Signed)
Bed: WA30 Expected date:  Expected time:  Means of arrival:  Comments: Hold for triage 4 

## 2018-04-01 NOTE — ED Notes (Signed)
Pt continues to refuse blood draw.  EDP here to evaluate for IVC.

## 2018-04-01 NOTE — ED Notes (Signed)
Patient awake and is calm at this time. Patient asked by writer if he would allow staff to draw his blood for labs, pt responded calmly "You are going to have to IVC me for that. You have no right to keep me. I am not suicidal or homicidal".
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
## 2018-04-01 NOTE — BH Assessment (Signed)
[facility] Assessment Progress Note Case was staffed with Lord DNP who recommended a inpatient admission to assist with stabilization.       

## 2018-04-01 NOTE — ED Provider Notes (Signed)
[facility]-EMERGENCY DEPT Provider Note   CSN: [id number] Arrival date & time: 04/01/18  1102     History   Chief Complaint Chief Complaint  Patient presents with  . Psychiatric Evaluation    HPI Maurice [name] is a 32 y.o. male.  Level 5 caveat for mental health concern.  Most of history obtained from mother.  Mother reports delusional commentary recently.  She states that her son has been seeing a girl and that he got married in [place], was involved in an engagement party, developed a new app, is extremely wealthy (all of which are not true).  He apparently had a head injury 1 month ago after a fall.  He has a history of alcohol abuse and is on questionable Suboxone, Adderall, Xanax per his psychiatrist.  He has been sleeping a lot lately and staying in his room and not working.  No previous inpatient psychiatric admissions.     Past Medical History:  Diagnosis Date  . Anxiety   . Substance abuse (HCC)     There are no active problems to display for this patient.   History reviewed. No pertinent surgical history.      Home Medications    Prior to Admission medications   Medication Sig Start Date End Date Taking? Authorizing Provider  amitriptyline (ELAVIL) 25 MG tablet Take 1 tablet (25 mg total) by mouth at bedtime. 03/31/18   Garlon Hatchet, PA-C  buprenorphine-naloxone (SUBOXONE) 8-2 MG SUBL SL tablet Place 1 tablet under the tongue daily.    [provider]  ibuprofen (ADVIL,MOTRIN) 800 MG tablet Take 1 tablet (800 mg total) by mouth 3 (three) times daily. 03/31/18   Garlon Hatchet, PA-C  meclizine (ANTIVERT) 25 MG tablet Take 1 tablet (25 mg total) by mouth 3 (three) times daily as needed for dizziness. 03/31/18   Garlon Hatchet, PA-C  mometasone (NASONEX) 50 MCG/ACT nasal spray Place 2 sprays into the nose daily. 03/31/18   Sherren Mocha, MD  ondansetron (ZOFRAN ODT) 4 MG disintegrating tablet Take 1 tablet (4 mg total) by mouth  every 8 (eight) hours as needed for nausea. 03/31/18   Garlon Hatchet, PA-C  promethazine (PHENERGAN) 25 MG tablet Take 1 tablet (25 mg total) by mouth every 8 (eight) hours as needed for nausea or vomiting. 03/31/18   Sherren Mocha, MD    Family History No family history on file.  Social History Social History   Tobacco Use  . Smoking status: Never Smoker  . Smokeless tobacco: Never Used  Substance Use Topics  . Alcohol use: Yes    Alcohol/week: 0.0 oz  . Drug use: No     Allergies   Patient has no known allergies.   Review of Systems Review of Systems  Unable to perform ROS: Psychiatric disorder     Physical Exam Updated Vital Signs BP (!) 160/90 (BP Location: Right Arm)   Pulse (!) 101   Temp 98.6 F (37 C) (Oral)   Resp 18   Ht 5' 8.5" (1.74 m)   Wt 68 kg (150 lb)   SpO2 100%   BMI 22.48 kg/m   Physical Exam  Constitutional: He is oriented to person, place, and time. He appears well-developed and well-nourished.  HENT:  Head: Normocephalic and atraumatic.  Eyes: Conjunctivae are normal.  Neck: Neck supple.  Cardiovascular: Normal rate and regular rhythm.  Pulmonary/Chest: Effort normal and breath sounds normal.  Abdominal: Soft. Bowel sounds are normal.  Musculoskeletal: Normal range of motion.  Neurological: He is alert and oriented to person, place, and time.  Skin: Skin is warm and dry.  Psychiatric:  Flat affect, flight of ideas, delusional thinking  Nursing note and vitals reviewed.    ED Treatments / Results  Labs (all labs ordered are listed, but only abnormal results are displayed) Labs Reviewed  CBC WITH DIFFERENTIAL/PLATELET  BASIC METABOLIC PANEL  ETHANOL  RAPID URINE DRUG SCREEN, HOSP PERFORMED    EKG None  Radiology Ct Head Wo Contrast  Result Date: 04/01/2018 CLINICAL DATA:  Head trauma EXAM: CT HEAD WITHOUT CONTRAST TECHNIQUE: Contiguous axial images were obtained from the base of the skull through the vertex without  intravenous contrast. COMPARISON:  None. FINDINGS: Brain: No evidence of acute infarction, hemorrhage, hydrocephalus, extra-axial collection or mass lesion/mass effect. Vascular: No hyperdense vessel or unexpected calcification. Skull: No osseous abnormality. Sinuses/Orbits: Visualized paranasal sinuses are clear. Visualized mastoid sinuses are clear. Visualized orbits demonstrate no focal abnormality. Other: None IMPRESSION: No acute intracranial pathology. Electronically Signed   By: Elige Ko   On: 04/01/2018 12:58    Procedures Procedures (including critical care time)  Medications Ordered in ED Medications - No data to display   Initial Impression / Assessment and Plan / ED Course  I have reviewed the triage vital signs and the nursing notes.  Pertinent labs & imaging results that were available during my care of the patient were reviewed by me and considered in my medical decision making (see chart for details).    Patient presents with concern of delusional thinking.  His mother states his behavior is abnormal.  Concerned about a possible bipolar versus schizophrenic event.  CT head negative.  Will consult behavioral health.   Final Clinical Impressions(s) / ED Diagnoses   Final diagnoses:  Delusions [facility])    ED Discharge Orders    None       Donnetta Hutching, MD 04/01/18 1319

## 2018-04-01 NOTE — ED Notes (Addendum)
Pt arrived to unit very guarded.  Instructed patient on unit rules.  Pt appears very withdrawn and apathetic.  15 minute checks and video monitoring in place.

## 2018-04-01 NOTE — ED Provider Notes (Signed)
6:50 PM Nursing reports that patient has been refusing all blood work and involvement in his care.  Review of the patient's chart was performed showing psychiatric recognitions for inpatient management.  They also documented concern for mania-like behavior and delusions with psychosis.  After their evaluation they are recommending inpatient management.  Patient needs blood work to be medically cleared and he continues to refuse it.  Given psychiatrist concern for delusions, psychosis, and need for inpatient management, IVC will be taken out on patient.  8:28 PM IVC paperwork was sent to magistrate and initially IVC was refused.  We will try and speak with psychiatry for assistance in management.  8:42 PM Spoke with 1 of the psychiatry team members and they agreed with the plan to have patient be reevaluated in the morning by psychiatry to determine if he needs IVC.  In the interim, if patient decides to leave, he is not under IVC and may go.  If this does happen, family can be informed that they are also able to IVC patient if they are concerned about dangerous behavior or being a danger to himself or others.  We will hold on obtaining blood work and let the patient rest overnight.    Maurice [name], Canary Brim, MD 04/01/18 2043

## 2018-04-01 NOTE — ED Notes (Addendum)
Pt refused blood work Charity fundraiser made aware, stated he would do blood work in the AM

## 2018-04-02 DIAGNOSIS — R825 Elevated urine levels of drugs, medicaments and biological substances: Secondary | ICD-10-CM | POA: Diagnosis not present

## 2018-04-02 DIAGNOSIS — F19980 Other psychoactive substance use, unspecified with psychoactive substance-induced anxiety disorder: Secondary | ICD-10-CM | POA: Diagnosis not present

## 2018-04-02 DIAGNOSIS — F1920 Other psychoactive substance dependence, uncomplicated: Secondary | ICD-10-CM

## 2018-04-02 DIAGNOSIS — F22 Delusional disorders: Secondary | ICD-10-CM | POA: Diagnosis not present

## 2018-04-02 NOTE — ED Notes (Signed)
Pt mother at bedside

## 2018-04-02 NOTE — ED Notes (Signed)
Pt d/c home with mother per MD order. Discharge summary reviewed with pt. Pt verbalizes understanding. Pt denies SI/HI/AVH. Pt signed for personal property and property returned. Pt signed e-signature. Ambulatory off unit.  

## 2018-04-02 NOTE — Consult Note (Addendum)
[facility] Face-to-Face Psychiatry Consult   Reason for Consult:  Agitation Referring Physician:  EDP Patient Identification: Maurice [name] MRN:  [id number] Principal Diagnosis: Substance-induced anxiety disorder [facility]) Diagnosis:   Patient Active Problem List   Diagnosis Date Noted  . Polysubstance (including opioids) dependence with physiological dependence (HCC) [F19.20] 04/02/2018  . 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 Delusions (HCC) [F22]     Total Time spent with patient: 45 minutes  Subjective:   Maurice [name] is a 32 y.o. male patient admitted with agitation and anxiety.  HPI:  Pt was seen and chart reviewed with treatment team and Dr Jannifer Franklin. Pt denies suicidal/homicidal ideation, denies auditory/visual hallucinations and does not appear to be responding to internal stimuli. Pt stated his parents brought him to the hospital because he had a head injury and they wanted him to be checked out. Pt's UDS positive for benzos, amphetamines, and THC. Pt is refusing blood work and stated that his his choice. Pt stated he was seen by a doctor after he fell and hit his head and he was given medications and told to rest for two weeks. On admission Pt appeared to be anxious and agitated but today he is calm and cooperative.  Pt stated he has a CT scan. Pt's CT scan was clear. Pt has a psychiatrist and is prescribed adderall, xanax, and suboxone but Pt stated he is not taking his adderall and suboxone for the past month.Pt is stable and psychiatrically clear for discharge.   Past Psychiatric History: As above  Risk to Self: Suicidal Ideation: No Suicidal Intent: No Is patient at risk for suicide?: No Suicidal Plan?: No Access to Means: No What has been your use of drugs/alcohol within the last 12 months?: UTA How many times?: 0 Other Self Harm Risks: (NA) Triggers for Past Attempts: Unknown Intentional Self Injurious Behavior: None Risk to Others: Homicidal  Ideation: No Thoughts of Harm to Others: No Current Homicidal Intent: No Current Homicidal Plan: No Access to Homicidal Means: No Identified Victim: NA History of harm to others?: No Assessment of Violence: None Noted Violent Behavior Description: NA Does patient have access to weapons?: No Criminal Charges Pending?: No Does patient have a court date: No Prior Inpatient Therapy: Prior Inpatient Therapy: No Prior Outpatient Therapy: Prior Outpatient Therapy: Yes Prior Therapy Dates: Ongoing Prior Therapy Facilty/Provider(s): Evelene Croon MD Reason for Treatment: Med mang Does patient have an ACCT team?: No Does patient have Intensive In-House Services?  : No Does patient have [facility] services? : No Does patient have P4CC services?: No  Past Medical History:  Past Medical History:  Diagnosis Date  . Anxiety   . Substance abuse (HCC)    History reviewed. No pertinent surgical history. Family History: No family history on file. Family Psychiatric  History: Unknown Social History:  Social History   Substance and Sexual Activity  Alcohol Use Yes  . Alcohol/week: 0.0 oz     Social History   Substance and Sexual Activity  Drug Use No    Social History   Socioeconomic History  . Marital status: Single    Spouse name: Not on file  . Number of children: Not on file  . Years of education: Not on file  . Highest education level: Not on file  Occupational History  . Not on file  Social Needs  . Financial resource strain: Not on file  . Food insecurity:    Worry: Not on file    Inability: Not on file  .  Transportation needs:    Medical: Not on file    Non-medical: Not on file  Tobacco Use  . Smoking status: Never Smoker  . Smokeless tobacco: Never Used  Substance and Sexual Activity  . Alcohol use: Yes    Alcohol/week: 0.0 oz  . Drug use: No  . Sexual activity: Not on file  Lifestyle  . Physical activity:    Days per week: Not on file    Minutes per session: Not on  file  . Stress: Not on file  Relationships  . Social connections:    Talks on phone: Not on file    Gets together: Not on file    Attends religious service: Not on file    Active member of club or organization: Not on file    Attends meetings of clubs or organizations: Not on file    Relationship status: Not on file  Other Topics Concern  . Not on file  Social History Narrative  . Not on file   Additional Social History:    Allergies:   Allergies  Allergen Reactions  . Other Nausea And Vomiting    Red meat products    Labs:  Results for orders placed or performed during the hospital encounter of 04/01/18 (from the past 48 hour(s))  Urine rapid drug screen (hosp performed)     Status: Abnormal   Collection Time: 04/01/18 12:34 PM  Result Value Ref Range   Opiates NONE DETECTED NONE DETECTED   Cocaine NONE DETECTED NONE DETECTED   Benzodiazepines POSITIVE (A) NONE DETECTED   Amphetamines POSITIVE (A) NONE DETECTED   Tetrahydrocannabinol POSITIVE (A) NONE DETECTED   Barbiturates NONE DETECTED NONE DETECTED    Comment: (NOTE) DRUG SCREEN FOR MEDICAL PURPOSES ONLY.  IF CONFIRMATION IS NEEDED FOR ANY PURPOSE, NOTIFY LAB WITHIN 5 DAYS. LOWEST DETECTABLE LIMITS FOR URINE DRUG SCREEN Drug Class                     Cutoff (ng/mL) Amphetamine and metabolites    1000 Barbiturate and metabolites    200 Benzodiazepine                 200 Tricyclics and metabolites     300 Opiates and metabolites        300 Cocaine and metabolites        300 THC                            50 Performed at [facility], [address] [place], [place] [address]     No current facility-administered medications for this encounter.    Current Outpatient Medications  Medication Sig Dispense Refill  . amitriptyline (ELAVIL) 25 MG tablet Take 1 tablet (25 mg total) by mouth at bedtime. 10 tablet 0  . Buprenorphine HCl-Naloxone HCl 8-2 MG FILM Place 1 tablet under the tongue  daily.     Marland Kitchen ibuprofen (ADVIL,MOTRIN) 800 MG tablet Take 1 tablet (800 mg total) by mouth 3 (three) times daily. 21 tablet 0  . meclizine (ANTIVERT) 25 MG tablet Take 1 tablet (25 mg total) by mouth 3 (three) times daily as needed for dizziness. 30 tablet 0  . mometasone (NASONEX) 50 MCG/ACT nasal spray Place 2 sprays into the nose daily. 17 g 1  . ondansetron (ZOFRAN ODT) 4 MG disintegrating tablet Take 1 tablet (4 mg total) by mouth every 8 (eight) hours as needed for nausea. 10  tablet 0  . promethazine (PHENERGAN) 25 MG tablet Take 1 tablet (25 mg total) by mouth every 8 (eight) hours as needed for nausea or vomiting. 20 tablet 0    Musculoskeletal: Strength & Muscle Tone: within normal limits Gait & Station: normal Patient leans: N/A  Psychiatric Specialty Exam: Physical Exam  Constitutional: He is oriented to person, place, and time. He appears well-developed and well-nourished.  HENT:  Head: Normocephalic.  Respiratory: Effort normal.  Musculoskeletal: Normal range of motion.  Neurological: He is alert and oriented to person, place, and time.  Psychiatric: His speech is normal and behavior is normal. Thought content normal. His mood appears anxious. Cognition and memory are normal. He expresses impulsivity.    Review of Systems  Psychiatric/Behavioral: Positive for depression and substance abuse. Negative for hallucinations, memory loss and suicidal ideas. The patient is nervous/anxious. The patient does not have insomnia.   All other systems reviewed and are negative.   Blood pressure 134/82, pulse 79, temperature 98.8 F (37.1 C), temperature source Oral, resp. rate 18, height 5' 8.5" (1.74 m), weight 150 lb (68 kg), SpO2 100 %.Body mass index is 22.48 kg/m.  General Appearance: Casual  Eye Contact:  Good  Speech:  Clear and Coherent and Normal Rate  Volume:  Normal  Mood:  Anxious  Affect:  Congruent and Depressed  Thought Process:  Coherent, Goal Directed and Linear   Orientation:  Full (Time, Place, and Person)  Thought Content:  Logical  Suicidal Thoughts:  No  Homicidal Thoughts:  No  Memory:  Immediate;   Good Recent;   Good Remote;   Fair  Judgement:  Fair  Insight:  Fair  Psychomotor Activity:  Normal  Concentration:  Concentration: Good and Attention Span: Good  Recall:  Good  Fund of Knowledge:  Good  Language:  Good  Akathisia:  No  Handed:  Right  AIMS (if indicated):     Assets:  Architect Housing Social Support  ADL's:  Intact  Cognition:  WNL  Sleep:        Treatment Plan Summary: Plan Substance-induced anxiety disorder [facility])  Discharge Home Take all medications as prescribed Avoid the use of alcohol and illicit drugs Follow up with your outpatient provider already in place  Disposition: No evidence of imminent risk to self or others at present.   Patient does not meet criteria for psychiatric inpatient admission. Supportive therapy provided about ongoing stressors. Discussed crisis plan, support from social network, calling 911, coming to the Emergency Department, and calling Suicide Hotline.  Laveda Abbe, NP 04/02/2018 11:53 AM  Patient seen face-to-face for psychiatric evaluation, chart reviewed and case discussed with the physician extender and developed treatment plan. Reviewed the information documented and agree with the treatment plan. Thedore Mins, MD

## 2018-04-02 NOTE — ED Notes (Signed)
Peer support at bedside 

## 2018-04-02 NOTE — Discharge Instructions (Signed)
Follow up with your outpatient psychiatrist and PCP who are caring for you.

## 2018-04-02 NOTE — [facility] Suicide Risk Assessment (Cosign Needed)
Suicide Risk Assessment  Discharge Assessment   [facility] Discharge Suicide Risk Assessment   Principal Problem: Substance-induced anxiety disorder [facility]) Discharge Diagnoses:  Patient Active Problem List   Diagnosis Date Noted  . Polysubstance (including opioids) dependence with physiological dependence (HCC) [F19.20] 04/02/2018  . 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 Delusions (HCC) [F22]     Total Time spent with patient: 45 minutes  Musculoskeletal: Strength & Muscle Tone: within normal limits Gait & Station: normal Patient leans: N/A   Psychiatric Specialty Exam: Physical Exam  Constitutional: He is oriented to person, place, and time. He appears well-developed and well-nourished.  HENT:  Head: Normocephalic.  Respiratory: Effort normal.  Musculoskeletal: Normal range of motion.  Neurological: He is alert and oriented to person, place, and time.  Psychiatric: His speech is normal and behavior is normal. Thought content normal. His mood appears anxious. Cognition and memory are normal. He expresses impulsivity.   Review of Systems  Psychiatric/Behavioral: Positive for depression and substance abuse. Negative for hallucinations, memory loss and suicidal ideas. The patient is nervous/anxious. The patient does not have insomnia.   All other systems reviewed and are negative.  Blood pressure 134/82, pulse 79, temperature 98.8 F (37.1 C), temperature source Oral, resp. rate 18, height 5' 8.5" (1.74 m), weight 150 lb (68 kg), SpO2 100 %.Body mass index is 22.48 kg/m. General Appearance: Casual Eye Contact:  Good Speech:  Clear and Coherent and Normal Rate Volume:  Normal Mood:  Anxious Affect:  Congruent and Depressed Thought Process:  Coherent, Goal Directed and Linear Orientation:  Full (Time, Place, and Person) Thought Content:  Logical Suicidal Thoughts:  No Homicidal Thoughts:  No Memory:  Immediate;   Good Recent;   Good Remote;    Fair Judgement:  Fair Insight:  Fair Psychomotor Activity:  Normal Concentration:  Concentration: Good and Attention Span: Good Recall:  Good Fund of Knowledge:  Good Language:  Good Akathisia:  No Handed:  Right AIMS (if indicated):    Assets:  Architect Housing Social Support ADL's:  Intact Cognition:  WNL   Mental Status Per Nursing Assessment::   On Admission:   Agitated and anxious  Demographic Factors:  Male and Caucasian  Loss Factors: NA  Historical Factors: Impulsivity  Risk Reduction Factors:   Sense of responsibility to family  Continued Clinical Symptoms:  Depression:   Impulsivity Alcohol/Substance Abuse/Dependencies  Cognitive Features That Contribute To Risk:  Closed-mindedness    Suicide Risk:  Minimal: No identifiable suicidal ideation.  Patients presenting with no risk factors but with morbid ruminations; may be classified as minimal risk based on the severity of the depressive symptoms    Plan Of Care/Follow-up recommendations:  Activity:  as tolerated  Diet:  Heart Healthy  Laveda Abbe, NP 04/02/2018, 11:52 AM

## 2018-04-02 NOTE — ED Notes (Signed)
Pt taking a shower 

## 2018-04-02 NOTE — Patient Outreach (Signed)
ED Peer Support Specialist Patient Intake (Complete at intake & 30-60 Day Follow-up)  Name: Maurice [name]  MRN: [id number]  Age: 31 y.o.   Date of Admission: 04/02/2018  Intake: Initial Comments:      Primary Reason Admitted: anxiety, poly substance use   Lab values: Alcohol/ETOH: Not completed Positive UDS? Yes Amphetamines: Yes Barbiturates: No Benzodiazepines: Yes Cocaine: No Opiates: No Cannabinoids: Yes  Demographic information: Gender: Male Ethnicity: White Marital Status: Single Insurance Status: Private Insurance(BCBS) Receives non-medical governmental assistance ([employer]/Welfare, food stamps, etc.: No Lives with: Alone Living situation: House/Apartment  Reported Patient History: Patient reported health conditions: Other (comment)(Anxiety) Patient aware of HIV and hepatitis status: Yes (comment)(Negative)  In past year, has patient visited ED for any reason? Yes  Number of ED visits: 1  Reason(s) for visit: Head injury  In past year, has patient been hospitalized for any reason? No  Number of hospitalizations:    Reason(s) for hospitalization:    In past year, has patient been arrested? No  Number of arrests:    Reason(s) for arrest:    In past year, has patient been incarcerated? No  Number of incarcerations:    Reason(s) for incarceration:    In past year, has patient received medication-assisted treatment? Yes, [facility]Suboxone from Dr. Kaur)  In past year, patient received the following treatments:    In past year, has patient received any harm reduction services? No  Did this include any of the following?    In past year, has patient received care from a mental health provider for diagnosis other than SUD? No  In past year, is this first time patient has overdosed? (Has not overdosed )  Number of past overdoses:    In past year, is this first time patient has been hospitalized for an overdose? (Has not  overdosed)  Number of hospitalizations for overdose(s):    Is patient currently receiving treatment for a mental health diagnosis? No  Patient reports experiencing difficulty participating in SUD treatment: No    Most important reason(s) for this difficulty?    Has patient received prior services for treatment? Yes(Dr. Kaur for suboxone treatment in outpatienting setting)  In past, patient has received services from following agencies:    Plan of Care:  Suggested follow up at these agencies/treatment centers: (Patient plans follow up with counseling and plans to continue taking Xanax for panic attacks and anxiety. )  Other information: [facility] met with the patient and provided substance use recovery support. Patient states that he is not interested in substance use treatment or other substance use recovery resources at this time. Patient is denying that he has a substance use issue. Patient states that he only wants to continue taking his Xanax prescription for panic attacks and anxiety. [facility] still provided [facility] contact information. [facility] highly encouraged the patient to follow up with [facility] if he feels like his substance use becomes unmanageable, so [facility] can provide substance use recovery support or help with recovery resources.     , [facility]  04/02/2018 11:59 AM          

## 2018-04-05 ENCOUNTER — Telehealth: Payer: Self-pay | Admitting: Family Medicine

## 2018-04-05 NOTE — Telephone Encounter (Signed)
Can we get OV notes created for pt so we can do prior auth for MRI? Pt also had CT done on 04/01/18. Do we still want to proceed with MRI? Thanks!

## 2018-04-05 NOTE — Telephone Encounter (Signed)
Note signed, Yes, still rec pt have MRI

## 2018-04-06 ENCOUNTER — Telehealth: Payer: Self-pay | Admitting: Family Medicine

## 2018-04-06 NOTE — Telephone Encounter (Signed)
Awesome. THank you!

## 2018-04-06 NOTE — Telephone Encounter (Signed)
Received prior auth for pt to have MRI. I spoke with pt's father due to pt's phone ringing straight to vm. He said he would have pt call us back to find out when he can go for MRI. I did let pt's father know referral was marked as urgent and needed to be scheduled ASAP. Thanks!

## 2018-04-06 NOTE — Telephone Encounter (Signed)
Copied from CRM [id number]. Topic: Quick Communication - See Telephone Encounter >> Apr 06, 2018  4:32 PM Raquel Sarna wrote: Pt called back and decided to wait on the MRI.  He wants to take me Rx prescribed and see if that will work for him.

## 2018-04-11 NOTE — Telephone Encounter (Signed)
Please schedule. Needs f/u visit.

## 2018-04-20 ENCOUNTER — Encounter (HOSPITAL_COMMUNITY): Payer: Self-pay | Admitting: Emergency Medicine

## 2018-04-20 ENCOUNTER — Emergency Department (HOSPITAL_COMMUNITY)
Admission: EM | Admit: 2018-04-20 | Discharge: 2018-04-21 | Disposition: A | Discharge status: Other Acute Inpatient Hospital | Payer: BLUE CROSS/BLUE SHIELD | Attending: Emergency Medicine | Admitting: Emergency Medicine

## 2018-04-20 DIAGNOSIS — Z87891 Personal history of nicotine dependence: Secondary | ICD-10-CM | POA: Insufficient documentation

## 2018-04-20 DIAGNOSIS — F1920 Other psychoactive substance dependence, uncomplicated: Secondary | ICD-10-CM | POA: Insufficient documentation

## 2018-04-20 DIAGNOSIS — F22 Delusional disorders: Secondary | ICD-10-CM | POA: Diagnosis not present

## 2018-04-20 DIAGNOSIS — Z79899 Other long term (current) drug therapy: Secondary | ICD-10-CM | POA: Insufficient documentation

## 2018-04-20 DIAGNOSIS — R443 Hallucinations, unspecified: Secondary | ICD-10-CM | POA: Diagnosis not present

## 2018-04-20 DIAGNOSIS — R45851 Suicidal ideations: Secondary | ICD-10-CM | POA: Diagnosis not present

## 2018-04-20 DIAGNOSIS — Z046 Encounter for general psychiatric examination, requested by authority: Secondary | ICD-10-CM | POA: Diagnosis not present

## 2018-04-20 LAB — CBC
HEMATOCRIT: 47.6 % (ref 39.0–52.0)
HEMOGLOBIN: 16.2 g/dL (ref 13.0–17.0)
MCH: 33 pg (ref 26.0–34.0)
MCHC: 34 g/dL (ref 30.0–36.0)
MCV: 96.9 fL (ref 78.0–100.0)
Platelets: 284 10*3/uL (ref 150–400)
RBC: 4.91 MIL/uL (ref 4.22–5.81)
RDW: 11.9 % (ref 11.5–15.5)
WBC: 7.8 10*3/uL (ref 4.0–10.5)

## 2018-04-20 LAB — COMPREHENSIVE METABOLIC PANEL
ALBUMIN: 4.8 g/dL (ref 3.5–5.0)
ALK PHOS: 71 U/L (ref 38–126)
ALT: 21 U/L (ref 17–63)
AST: 18 U/L (ref 15–41)
Anion gap: 7 (ref 5–15)
BUN: 15 mg/dL (ref 6–20)
CALCIUM: 9.1 mg/dL (ref 8.9–10.3)
CHLORIDE: 103 mmol/L (ref 101–111)
CO2: 29 mmol/L (ref 22–32)
CREATININE: 0.84 mg/dL (ref 0.61–1.24)
GFR calc Af Amer: >60 mL/min (ref >60)
GFR calc non Af Amer: >60 mL/min (ref >60)
GLUCOSE: 108 mg/dL — AB (ref 65–99)
Potassium: 4.4 mmol/L (ref 3.5–5.1)
SODIUM: 139 mmol/L (ref 135–145)
Total Bilirubin: 0.5 mg/dL (ref 0.3–1.2)
Total Protein: 7.9 g/dL (ref 6.5–8.1)

## 2018-04-20 LAB — RAPID URINE DRUG SCREEN, HOSP PERFORMED
AMPHETAMINES: NOT DETECTED
BARBITURATES: NOT DETECTED
Benzodiazepines: POSITIVE — AB
Cocaine: NOT DETECTED
Opiates: NOT DETECTED
Tetrahydrocannabinol: NOT DETECTED

## 2018-04-20 LAB — ACETAMINOPHEN LEVEL: Acetaminophen (Tylenol), Serum: <10 ug/mL — ABNORMAL LOW (ref 10–30)

## 2018-04-20 LAB — ETHANOL: Alcohol, Ethyl (B): <10 mg/dL (ref <10)

## 2018-04-20 LAB — SALICYLATE LEVEL: Salicylate Lvl: <7 mg/dL (ref 2.8–30.0)

## 2018-04-20 MED ORDER — NICOTINE 21 MG/24HR TD PT24: 21.0000 mg | MEDICATED_PATCH | Freq: Every day | TRANSDERMAL | Status: DC

## 2018-04-20 MED ORDER — ALUM & MAG HYDROXIDE-SIMETH 200-200-20 MG/5ML PO SUSP: 30.0000 mL | Freq: Four times a day (QID) | ORAL | Status: DC | PRN

## 2018-04-20 MED ORDER — ACETAMINOPHEN 325 MG PO TABS: 650.0000 mg | ORAL_TABLET | ORAL | Status: DC | PRN

## 2018-04-20 MED ORDER — ONDANSETRON HCL 4 MG PO TABS: 4.0000 mg | ORAL_TABLET | Freq: Three times a day (TID) | ORAL | Status: DC | PRN

## 2018-04-20 NOTE — ED Notes (Signed)
Patient denies SI/HI/AVH at this time. Plan of care discussed. Encouragement and support provided and safety maintain. Q 15 min safety checks remain in place and video monitoring. 

## 2018-04-20 NOTE — ED Notes (Signed)
Bed: WLPT4 Expected date:  Expected time:  Means of arrival:  Comments: 

## 2018-04-20 NOTE — BH Assessment (Signed)
Assessment Note  Maurice [name] is an 32 y.o. male. Pt was IVCd by his sister. Pt's IVC stated the following: "a danger to self and others, to ZOX:WRUEAV to petitioner "they don't understand" him and "hates his life" abd should "just kill" himself; says people are telling him what to do and when to take his meds; says he has been telepathically communicating with Maurice [name] and Maurice [name] and they tell him that people are trying to kill him; packed his bags and put them by the door as if someone is coming to pick him up; demanded to have a gun, but family refused to give it to him; petitioner reports he is abusing his prescribed meds and may be abusing alcohol as well; reported head injury about 2 weeks ago and was taken to ER, did not know what year or day or time of day it was." Pt denies the allegations in the IVC.  Pt denies SI/HI and AVH. Pt was oriented x3. Pt states he fell last month and experienced a head trauma. Pt states he is not sure if he had a concussion or a TBI. Pt states his PCP ordered a MRI but he has not followed up with the MRI appointment. Pt reports previous alcohol and Adderall use. Pt states he was previously prescribed Suboxone but he whined himself off of the medication. Pt states he is in conflict with his family because they will not allow him to live independently.  Maurice Niemann, DNP recommends inpatient treatment.   Diagnosis:  F29 Acute Psyhcosis  Past Medical History:  Past Medical History:  Diagnosis Date  . Anxiety   . Substance abuse (HCC)     History reviewed. No pertinent surgical history.  Family History: No family history on file.  Social History:  reports that he has quit smoking. He has never used smokeless tobacco. He reports that he drank alcohol. He reports that he does not use drugs.  Additional Social History:  Alcohol / Drug Use Pain Medications: please see mar Prescriptions: please see mar Over the Counter: please see mar History of alcohol / drug  use?: Yes Longest period of sobriety (when/how long): unknown Substance #1 Name of Substance 1: benzodiazpine 1 - Age of First Use: unknown 1 - Amount (size/oz): unknown 1 - Frequency: unknown 1 - Duration: unknown 1 - Last Use / Amount: unknown  CIWA: CIWA-Ar BP: (!) 132/96 Pulse Rate: 79 COWS:    Allergies:  Allergies  Allergen Reactions  . Other Nausea And Vomiting    Red meat products    Home Medications:  (Not in a hospital admission)  OB/GYN Status:  No LMP for male patient.  General Assessment Data Location of Assessment: [facility] TTS Assessment: In system Is this a Tele or Face-to-Face Assessment?: Face-to-Face Is this an Initial Assessment or a Re-assessment for this encounter?: Initial Assessment Marital status: Single Maiden name: NA Is patient pregnant?: No Pregnancy Status: No Living Arrangements: Parent Can pt return to current living arrangement?: Yes Admission Status: Involuntary Is patient capable of signing voluntary admission?: No Referral Source: Self/Family/Friend Insurance type: BCBS     Crisis Care Plan Living Arrangements: Parent Legal Guardian: Other:(self) Name of Psychiatrist: Evelene Croon MD Name of Therapist: None  Education Status Is patient currently in school?: No Is the patient employed, unemployed or receiving disability?: Employed  Risk to self with the past 6 months Suicidal Ideation: No Has patient been a risk to self within the past 6 months prior to admission? : No  Suicidal Intent: No Has patient had any suicidal intent within the past 6 months prior to admission? : No Is patient at risk for suicide?: No Suicidal Plan?: No Has patient had any suicidal plan within the past 6 months prior to admission? : No Access to Means: No What has been your use of drugs/alcohol within the last 12 months?: NA Previous Attempts/Gestures: No How many times?: 0 Other Self Harm Risks: NA Triggers for Past Attempts: None known Intentional  Self Injurious Behavior: None Family Suicide History: No Recent stressful life event(s): Other (Comment), Trauma (Comment)(head trauma) Persecutory voices/beliefs?: No Depression: No Depression Symptoms: (pt denies) Substance abuse history and/or treatment for substance abuse?: Yes Suicide prevention information given to non-admitted patients: Not applicable  Risk to Others within the past 6 months Homicidal Ideation: No Does patient have any lifetime risk of violence toward others beyond the six months prior to admission? : No Thoughts of Harm to Others: No Current Homicidal Intent: No Current Homicidal Plan: No Access to Homicidal Means: No Identified Victim: nA History of harm to others?: No Assessment of Violence: None Noted Violent Behavior Description: NA Does patient have access to weapons?: No Criminal Charges Pending?: No Does patient have a court date: No Is patient on probation?: No  Psychosis Hallucinations: None noted Delusions: Grandiose  Mental Status Report Appearance/Hygiene: In scrubs Eye Contact: Fair Motor Activity: Freedom of movement Speech: Logical/coherent Level of Consciousness: Alert Mood: Euthymic Affect: Appropriate to circumstance Anxiety Level: Minimal Thought Processes: Coherent, Relevant Judgement: Unimpaired Orientation: Person, Place, Time, Situation Obsessive Compulsive Thoughts/Behaviors: None  Cognitive Functioning Concentration: Normal Memory: Recent Intact, Remote Intact Is patient IDD: No Is patient DD?: No Insight: Fair Impulse Control: Fair Appetite: Fair Have you had any weight changes? : No Change Sleep: No Change Total Hours of Sleep: 8 Vegetative Symptoms: None  ADLScreening [facility] Assessment Services) Patient's cognitive ability adequate to safely complete daily activities?: Yes Patient able to express need for assistance with ADLs?: Yes Independently performs ADLs?: Yes (appropriate for developmental  age)  Prior Inpatient Therapy Prior Inpatient Therapy: No  Prior Outpatient Therapy Prior Outpatient Therapy: Yes Prior Therapy Dates: Ongoing Prior Therapy Facilty/Provider(s): Evelene Croon MD Reason for Treatment: Med mang Does patient have an ACCT team?: No Does patient have Intensive In-House Services?  : No Does patient have [facility] services? : No Does patient have P4CC services?: No  ADL Screening (condition at time of admission) Patient's cognitive ability adequate to safely complete daily activities?: Yes Is the patient deaf or have difficulty hearing?: No Does the patient have difficulty concentrating, remembering, or making decisions?: No Patient able to express need for assistance with ADLs?: Yes Does the patient have difficulty dressing or bathing?: No Independently performs ADLs?: Yes (appropriate for developmental age) Does the patient have difficulty walking or climbing stairs?: No       Abuse/Neglect Assessment (Assessment to be complete while patient is alone) Abuse/Neglect Assessment Can Be Completed: Yes Physical Abuse: Denies Verbal Abuse: Denies Sexual Abuse: Denies Exploitation of patient/patient's resources: Denies     Merchant navy officer (For Healthcare) Does Patient Have a Medical Advance Directive?: No Would patient like information on creating a medical advance directive?: No - Patient declined    Additional Information 1:1 In Past 12 Months?: No CIRT Risk: No Elopement Risk: No Does patient have medical clearance?: Yes     Disposition:  Disposition Initial Assessment Completed for this Encounter: Yes Disposition of Patient: Admit Type of inpatient treatment program: Adult  On Site Evaluation by:  Reviewed with Physician:    Emmit PomfretLevette,Beatrice Ziehm D 04/20/2018 4:49 PM

## 2018-04-20 NOTE — ED Triage Notes (Signed)
Pt brought in by [employer] under IVC papers that were taken out by sister that state:  "a danger to self and others, to ZOX:WRUEAVwit:stated to petitioner "they don't understand" him and "hates his life" abd should "just kill" himself; says people are telling him what to do and when to take his meds; says he has been telepathically communicating with Seward Grateressla and Beverly SessionsJohn Lennon and they tell him that people are trying to kill him; packed his bags and put them by the door as if someone is coming to pick him up; demanded to have a gun, but family refused to give it to him; petitioner reports he is abusing his prescribed meds and may be abusing alcohol as well; reported head injury about 2 weeks ago and was taken to ER, did not know what year or day or time of day it was."

## 2018-04-20 NOTE — ED Provider Notes (Signed)
[facility]-EMERGENCY DEPT Provider Note   CSN: [id number] Arrival date & time: 04/20/18  1339     History   Chief Complaint Chief Complaint  Patient presents with  . IVC    HPI Maurice [name] is a 32 y.o. male.  HPI Maurice [name] is a 32 y.o. male with history of polysubstance abuse, anxiety, delusions, presents to emergency department with complaint of being involuntary committed.  Patient states he does not know why he is committed.  In fact he is telling me that he is not sure who committed him.  Patient states he was just sitting at home when police officers came and got him.  He denies being delusional or hallucinating.  He denies any recent drug use.  He states he took himself off Suboxone since being in the hospital last time and states he is sure it is often out of his system.  He denies any alcohol use in the last several days.  He does report he fell 2 weeks ago and sustained a head injury.  He denies any suicidal homicidal ideations on by his family report that patient has voiced suicidal thoughts to them.  And that is the reason the IVC is him.  They also stated that patient has mentioned things such as "I hate my life" and that he "just want to kill" himself.  He apparently voiced to his family that he has been communicating with Phillips Hay.  Patient also demented family if he could have a gun which they did not allow. PT IVCed by his sister  Past Medical History:  Diagnosis Date  . Anxiety   . Substance abuse [facility])     Patient Active Problem List   Diagnosis Date Noted  . Polysubstance (including opioids) dependence with physiological dependence (HCC) 04/02/2018  . Substance-induced anxiety disorder (HCC) 04/02/2018  . Delusions (HCC)     History reviewed. No pertinent surgical history.      Home Medications    Prior to Admission medications   Medication Sig Start Date End Date Taking? Authorizing Provider  ALPRAZolam  Prudy Feeler) 1 MG tablet Take 1 mg by mouth daily as needed. 04/18/18  Yes [provider]  ibuprofen (ADVIL,MOTRIN) 800 MG tablet Take 1 tablet (800 mg total) by mouth 3 (three) times daily. 03/31/18  Yes Garlon Hatchet, PA-C  meclizine (ANTIVERT) 25 MG tablet Take 1 tablet (25 mg total) by mouth 3 (three) times daily as needed for dizziness. 03/31/18  Yes Garlon Hatchet, PA-C  promethazine (PHENERGAN) 25 MG tablet Take 1 tablet (25 mg total) by mouth every 8 (eight) hours as needed for nausea or vomiting. 03/31/18  Yes Sherren Mocha, MD  amitriptyline (ELAVIL) 25 MG tablet Take 1 tablet (25 mg total) by mouth at bedtime. Patient not taking: Reported on 04/20/2018 03/31/18   Garlon Hatchet, PA-C  amphetamine-dextroamphetamine (ADDERALL) 20 MG tablet Take 20 mg by mouth daily. 04/15/18   [provider]  Buprenorphine HCl-Naloxone HCl 8-2 MG FILM Place 1 tablet under the tongue daily.     [provider]  mometasone (NASONEX) 50 MCG/ACT nasal spray Place 2 sprays into the nose daily. Patient not taking: Reported on 04/20/2018 03/31/18   Sherren Mocha, MD  ondansetron (ZOFRAN ODT) 4 MG disintegrating tablet Take 1 tablet (4 mg total) by mouth every 8 (eight) hours as needed for nausea. Patient not taking: Reported on 04/20/2018 03/31/18   Garlon Hatchet, PA-C  Family History No family history on file.  Social History Social History   Tobacco Use  . Smoking status: Former Games developermoker  . Smokeless tobacco: Never Used  Substance Use Topics  . Alcohol use: Not Currently    Alcohol/week: 0.0 oz  . Drug use: No     Allergies   Other   Review of Systems Review of Systems  Constitutional: Negative for chills and fever.  Respiratory: Negative for cough, chest tightness and shortness of breath.   Cardiovascular: Negative for chest pain, palpitations and leg swelling.  Gastrointestinal: Negative for abdominal distention, abdominal pain, diarrhea, nausea and vomiting.    Genitourinary: Negative for dysuria, frequency, hematuria and urgency.  Musculoskeletal: Negative for arthralgias, myalgias, neck pain and neck stiffness.  Skin: Negative for rash.  Allergic/Immunologic: Negative for immunocompromised state.  Neurological: Negative for dizziness, weakness, light-headedness, numbness and headaches.  Psychiatric/Behavioral: Positive for behavioral problems, hallucinations and suicidal ideas.  All other systems reviewed and are negative.    Physical Exam Updated Vital Signs BP (!) 132/96 (BP Location: Left Arm)   Pulse 79   Temp 97.8 F (36.6 C) (Oral)   Resp 16   SpO2 100%   Physical Exam  Constitutional: He appears well-developed and well-nourished. No distress.  HENT:  Head: Normocephalic and atraumatic.  Eyes: Conjunctivae are normal.  Neck: Neck supple.  Cardiovascular: Normal rate, regular rhythm and normal heart sounds.  Pulmonary/Chest: Effort normal. No respiratory distress. He has no wheezes. He has no rales.  Abdominal: Soft. Bowel sounds are normal. He exhibits no distension. There is no tenderness. There is no rebound.  Musculoskeletal: He exhibits no edema.  Neurological: He is alert.  Skin: Skin is warm and dry.  Psychiatric: Thought content normal.  Flat affect  Nursing note and vitals reviewed.    ED Treatments / Results  Labs (all labs ordered are listed, but only abnormal results are displayed) Labs Reviewed  COMPREHENSIVE METABOLIC PANEL - Abnormal; Notable for the following components:      Result Value   Glucose, Bld 108 (*)    All other components within normal limits  ACETAMINOPHEN LEVEL - Abnormal; Notable for the following components:   Acetaminophen (Tylenol), Serum <10 (*)    All other components within normal limits  ETHANOL  SALICYLATE LEVEL  CBC  RAPID URINE DRUG SCREEN, HOSP PERFORMED    EKG None  Radiology No results found.  Procedures Procedures (including critical care time)  Medications  Ordered in ED Medications - No data to display   Initial Impression / Assessment and Plan / ED Course  I have reviewed the triage vital signs and the nursing notes.  Pertinent labs & imaging results that were available during my care of the patient were reviewed by me and considered in my medical decision making (see chart for details).    Patient in emergency department involuntary commitment for vital signs and possible hallucinations.  Although he denies any suicidal thoughts or hallucinations to me, he does have pretty flat affect, and sometimes tangential speech.  He denies any recent drug or alcohol use.  We will check medical clearance labs and consult CTS for further evaluation.  3:29 PM Patient medically cleared other than drug screen which is still pending which I do not think well prevent patient from being medically cleared.  Will place TTS assessment orders and holding orders.  Vitals:   04/20/18 1348  BP: (!) 132/96  Pulse: 79  Resp: 16  Temp: 97.8 F (36.6 C)  TempSrc: Oral  SpO2: 100%     Final Clinical Impressions(s) / ED Diagnoses   Final diagnoses:  Suicidal ideations  Hallucinations    ED Discharge Orders    None       Jaynie Crumble, PA-C 04/20/18 1530    Arby Barrette, MD 04/20/18 1547

## 2018-04-20 NOTE — ED Notes (Signed)
Pt denies SI or HI at this time.

## 2018-04-20 NOTE — ED Notes (Signed)
Pt to room #40, limited assessment d/t pt forwards little with this nurse. Pt has limited insight to why he is here at the hospital. Blunted affect noted. Denies SI/HI. Encouragement and support provided .Special checks q 15 mins in place for safety, Video monitoring in place. Will continue to monitor.

## 2018-04-21 ENCOUNTER — Other Ambulatory Visit: Payer: Self-pay

## 2018-04-21 ENCOUNTER — Inpatient Hospital Stay (HOSPITAL_COMMUNITY)
Admission: AD | Admit: 2018-04-21 | Discharge: 2018-05-17 | DRG: 885 | Disposition: A | Discharge status: Home / Self Care | Payer: BLUE CROSS/BLUE SHIELD | Source: Intra-hospital | Attending: Psychiatry | Admitting: Psychiatry

## 2018-04-21 ENCOUNTER — Encounter (HOSPITAL_COMMUNITY): Payer: Self-pay

## 2018-04-21 DIAGNOSIS — Z9114 Patient's other noncompliance with medication regimen: Secondary | ICD-10-CM | POA: Diagnosis not present

## 2018-04-21 DIAGNOSIS — F312 Bipolar disorder, current episode manic severe with psychotic features: Secondary | ICD-10-CM | POA: Diagnosis present

## 2018-04-21 DIAGNOSIS — Z87820 Personal history of traumatic brain injury: Secondary | ICD-10-CM

## 2018-04-21 DIAGNOSIS — F22 Delusional disorders: Secondary | ICD-10-CM | POA: Diagnosis not present

## 2018-04-21 DIAGNOSIS — F419 Anxiety disorder, unspecified: Secondary | ICD-10-CM | POA: Diagnosis not present

## 2018-04-21 DIAGNOSIS — Z8659 Personal history of other mental and behavioral disorders: Secondary | ICD-10-CM | POA: Diagnosis not present

## 2018-04-21 DIAGNOSIS — Z87891 Personal history of nicotine dependence: Secondary | ICD-10-CM

## 2018-04-21 DIAGNOSIS — F410 Panic disorder [episodic paroxysmal anxiety] without agoraphobia: Secondary | ICD-10-CM | POA: Diagnosis present

## 2018-04-21 DIAGNOSIS — G4700 Insomnia, unspecified: Secondary | ICD-10-CM | POA: Diagnosis not present

## 2018-04-21 DIAGNOSIS — R454 Irritability and anger: Secondary | ICD-10-CM | POA: Diagnosis not present

## 2018-04-21 DIAGNOSIS — F23 Brief psychotic disorder: Secondary | ICD-10-CM | POA: Diagnosis not present

## 2018-04-21 DIAGNOSIS — F1310 Sedative, hypnotic or anxiolytic abuse, uncomplicated: Secondary | ICD-10-CM | POA: Diagnosis present

## 2018-04-21 DIAGNOSIS — R42 Dizziness and giddiness: Secondary | ICD-10-CM | POA: Diagnosis not present

## 2018-04-21 DIAGNOSIS — R443 Hallucinations, unspecified: Secondary | ICD-10-CM

## 2018-04-21 DIAGNOSIS — F909 Attention-deficit hyperactivity disorder, unspecified type: Secondary | ICD-10-CM | POA: Diagnosis present

## 2018-04-21 DIAGNOSIS — R45851 Suicidal ideations: Secondary | ICD-10-CM | POA: Diagnosis not present

## 2018-04-21 DIAGNOSIS — F1510 Other stimulant abuse, uncomplicated: Secondary | ICD-10-CM | POA: Diagnosis present

## 2018-04-21 DIAGNOSIS — F29 Unspecified psychosis not due to a substance or known physiological condition: Secondary | ICD-10-CM | POA: Diagnosis not present

## 2018-04-21 DIAGNOSIS — F4010 Social phobia, unspecified: Secondary | ICD-10-CM | POA: Diagnosis not present

## 2018-04-21 DIAGNOSIS — R450 Nervousness: Secondary | ICD-10-CM | POA: Diagnosis not present

## 2018-04-21 DIAGNOSIS — Z79899 Other long term (current) drug therapy: Secondary | ICD-10-CM | POA: Diagnosis not present

## 2018-04-21 DIAGNOSIS — R451 Restlessness and agitation: Secondary | ICD-10-CM | POA: Diagnosis present

## 2018-04-21 DIAGNOSIS — G249 Dystonia, unspecified: Secondary | ICD-10-CM | POA: Diagnosis present

## 2018-04-21 DIAGNOSIS — R51 Headache: Secondary | ICD-10-CM | POA: Diagnosis not present

## 2018-04-21 MED ORDER — ZIPRASIDONE MESYLATE 20 MG IM SOLR
20.0000 mg | Freq: Once | INTRAMUSCULAR | Status: AC | PRN
Start: 2018-04-21 — End: 2018-04-23
  Administered 2018-04-23: 20 mg via INTRAMUSCULAR
  Filled 2018-04-21: qty 20

## 2018-04-21 MED ORDER — HYDROXYZINE HCL 25 MG PO TABS
25.0000 mg | ORAL_TABLET | Freq: Three times a day (TID) | ORAL | Status: DC | PRN
  Filled 2018-04-21 (×2): qty 1

## 2018-04-21 MED ORDER — MAGNESIUM HYDROXIDE 400 MG/5ML PO SUSP: 30.0000 mL | Freq: Every day | ORAL | Status: DC | PRN

## 2018-04-21 MED ORDER — ALUM & MAG HYDROXIDE-SIMETH 200-200-20 MG/5ML PO SUSP: 30.0000 mL | ORAL | Status: DC | PRN

## 2018-04-21 MED ORDER — ACETAMINOPHEN 325 MG PO TABS: 650.0000 mg | ORAL_TABLET | Freq: Four times a day (QID) | ORAL | Status: DC | PRN

## 2018-04-21 MED ORDER — LORAZEPAM 1 MG PO TABS: 1.0000 mg | ORAL_TABLET | Freq: Once | ORAL | Status: DC | PRN

## 2018-04-21 MED ORDER — TRAZODONE HCL 50 MG PO TABS
50.0000 mg | ORAL_TABLET | Freq: Every evening | ORAL | Status: DC | PRN
  Administered 2018-04-21: 50 mg via ORAL
  Filled 2018-04-21 (×2): qty 1

## 2018-04-21 NOTE — Consult Note (Addendum)
[facility] Consult   Reason for Consult:  Suicidal ideation Referring Physician:  EDP Patient Identification: Maurice [name] MRN:  [id number] Principal Diagnosis: Delusions [facility]) Diagnosis:   Patient Active Problem List   Diagnosis Date Noted  . Hallucinations [R44.3]   . Suicidal ideations [R45.851]   . Polysubstance (including opioids) dependence with physiological dependence ([place]) [F19.20] 04/02/2018  . Substance-induced anxiety disorder ([place]) [F19.980] 04/02/2018  . Delusions ([place]) [F22]     Total Time spent with patient: 45 minutes  Subjective:   Maurice [name] is a 32 y.o. male patient admitted with suicidal ideation.  HPI:  Pt was seen and chart reviewed with treatment team and Dr Mariea Clonts.  Pt denies suicidal/homicidal ideation, denies auditory/visual hallucinations and does not appear to be responding to internal stimuli. Pt stated he doesn't know why he is here. Pt's sister placed him under IVC. Per IVC paperwork, Pt is a danger to himself and others and is paranoid and delusional, requesting a gun and saying he can telepathically communicate with Margorie John and Iona Beard. Pt is supoosedly abusing his home medications.  Pt was recently here in the ED for the same presentation. Pt see Dr Toy Care for psychiatry and is prescribed Alprazolam for his anxiety. Pt is prescribed 1 mg PO QD but stated he takes 1-2 mg if he needs to. Pt's UDS positive for benzos, BAL negative. Pt is minimizing what is going on at home in order to hopefully be discharged. Pt would benefit from an inpatient psychiatric hospitalization for crisis stabilization and medication management.   Past Psychiatric History: As stated above  Risk to Self: Suicidal Ideation: No Suicidal Intent: No Is patient at risk for suicide?: No Suicidal Plan?: No Access to Means: No What has been your use of drugs/alcohol within the last 12 months?: NA How many times?: 0 Other Self Harm Risks: NA Triggers for  Past Attempts: None known Intentional Self Injurious Behavior: None Risk to Others: Homicidal Ideation: No Thoughts of Harm to Others: No Current Homicidal Intent: No Current Homicidal Plan: No Access to Homicidal Means: No Identified Victim: nA History of harm to others?: No Assessment of Violence: None Noted Violent Behavior Description: NA Does patient have access to weapons?: No Criminal Charges Pending?: No Does patient have a court date: No Prior Inpatient Therapy: Prior Inpatient Therapy: No Prior Outpatient Therapy: Prior Outpatient Therapy: Yes Prior Therapy Dates: Ongoing Prior Therapy Facilty/Provider(s): [facility] MD Reason for Treatment: Med mang Does patient have an ACCT team?: No Does patient have Intensive In-House Services?  : No Does patient have [facility] services? : No Does patient have P4CC services?: No  Past Medical History:  Past Medical History:  Diagnosis Date  . Anxiety   . Substance abuse ([place])    History reviewed. No pertinent surgical history. Family History: No family history on file. Family Psychiatric  History: Unknown Social History:  Social History   Substance and Sexual Activity  Alcohol Use Not Currently  . Alcohol/week: 0.0 oz     Social History   Substance and Sexual Activity  Drug Use No    Social History   Socioeconomic History  . Marital status: Single    Spouse name: Not on file  . Number of children: Not on file  . Years of education: Not on file  . Highest education level: Not on file  Occupational History  . Not on file  Social Needs  . Financial resource strain: Not on file  . Food insecurity:  Worry: Not on file    Inability: Not on file  . Transportation needs:    Medical: Not on file    Non-medical: Not on file  Tobacco Use  . Smoking status: Former Research scientist (life sciences)  . Smokeless tobacco: Never Used  Substance and Sexual Activity  . Alcohol use: Not Currently    Alcohol/week: 0.0 oz  . Drug use: No  . Sexual  activity: Not on file  Lifestyle  . Physical activity:    Days per week: Not on file    Minutes per session: Not on file  . Stress: Not on file  Relationships  . Social connections:    Talks on phone: Not on file    Gets together: Not on file    Attends religious service: Not on file    Active member of club or organization: Not on file    Attends meetings of clubs or organizations: Not on file    Relationship status: Not on file  Other Topics Concern  . Not on file  Social History Narrative  . Not on file   Additional Social History: N/A    Allergies:   Allergies  Allergen Reactions  . Other Nausea And Vomiting    Red meat products    Labs:  Results for orders placed or performed during the hospital encounter of 04/20/18 (from the past 48 hour(s))  Comprehensive metabolic panel     Status: Abnormal   Collection Time: 04/20/18  2:40 PM  Result Value Ref Range   Sodium 139 135 - 145 mmol/L   Potassium 4.4 3.5 - 5.1 mmol/L   Chloride 103 101 - 111 mmol/L   CO2 29 22 - 32 mmol/L   Glucose, Bld 108 (H) 65 - 99 mg/dL   BUN 15 6 - 20 mg/dL   Creatinine, Ser 0.84 0.61 - 1.24 mg/dL   Calcium 9.1 8.9 - 10.3 mg/dL   Total Protein 7.9 6.5 - 8.1 g/dL   Albumin 4.8 3.5 - 5.0 g/dL   AST 18 15 - 41 U/L   ALT 21 17 - 63 U/L   Alkaline Phosphatase 71 38 - 126 U/L   Total Bilirubin 0.5 0.3 - 1.2 mg/dL   GFR calc non Af Amer >60 >60 mL/min   GFR calc Af Amer >60 >60 mL/min    Comment: (NOTE) The eGFR has been calculated using the CKD EPI equation. This calculation has not been validated in all clinical situations. eGFR's persistently <60 mL/min signify possible Chronic Kidney Disease.    Anion gap 7 5 - 15    Comment: Performed at [facility], [place] [address] [place], [place] [address]  Ethanol     Status: None   Collection Time: 04/20/18  2:40 PM  Result Value Ref Range   Alcohol, Ethyl (B) <10 <10 mg/dL    Comment: (NOTE) Lowest detectable limit for  serum alcohol is 10 mg/dL. For medical purposes only. Performed at [facility], [place] [address], [place], [place] [address]   Salicylate level     Status: None   Collection Time: 04/20/18  2:40 PM  Result Value Ref Range   Salicylate Lvl <5.5 2.8 - 30.0 mg/dL    Comment: Performed at [facility], [place] [address] [place], [place] [address]  Acetaminophen level     Status: Abnormal   Collection Time: 04/20/18  2:40 PM  Result Value Ref Range   Acetaminophen (Tylenol), Serum <10 (L) 10 - 30 ug/mL  Comment: (NOTE) Therapeutic concentrations vary significantly. A range of 10-30 ug/mL  may be an effective concentration for many patients. However, some  are best treated at concentrations outside of this range. Acetaminophen concentrations >150 ug/mL at 4 hours after ingestion  and >50 ug/mL at 12 hours after ingestion are often associated with  toxic reactions. Performed at [facility], [place] [address] [place], [place] [address]   cbc     Status: None   Collection Time: 04/20/18  2:40 PM  Result Value Ref Range   WBC 7.8 4.0 - 10.5 K/uL   RBC 4.91 4.22 - 5.81 MIL/uL   Hemoglobin 16.2 13.0 - 17.0 g/dL   HCT 47.6 39.0 - 52.0 %   MCV 96.9 78.0 - 100.0 fL   MCH 33.0 26.0 - 34.0 pg   MCHC 34.0 30.0 - 36.0 g/dL   RDW 11.9 11.5 - 15.5 %   Platelets 284 150 - 400 K/uL    Comment: Performed at [facility], [place] [address] [place], [place] [address]  Rapid urine drug screen (hospital performed)     Status: Abnormal   Collection Time: 04/20/18  3:38 PM  Result Value Ref Range   Opiates NONE DETECTED NONE DETECTED   Cocaine NONE DETECTED NONE DETECTED   Benzodiazepines POSITIVE (A) NONE DETECTED   Amphetamines NONE DETECTED NONE DETECTED   Tetrahydrocannabinol NONE DETECTED NONE DETECTED   Barbiturates NONE DETECTED NONE DETECTED    Comment: (NOTE) DRUG SCREEN FOR MEDICAL PURPOSES ONLY.  IF CONFIRMATION IS  NEEDED FOR ANY PURPOSE, NOTIFY LAB WITHIN 5 DAYS. LOWEST DETECTABLE LIMITS FOR URINE DRUG SCREEN Drug Class                     Cutoff (ng/mL) Amphetamine and metabolites    1000 Barbiturate and metabolites    200 Benzodiazepine                 767 Tricyclics and metabolites     300 Opiates and metabolites        300 Cocaine and metabolites        300 THC                            50 Performed at [facility], [place] [address], [place], [place] [address]     Current Facility-Administered Medications  Medication Dose Route Frequency Provider Last Rate Last Dose  . acetaminophen (TYLENOL) tablet 650 mg  650 mg Oral Q4H PRN Kirichenko, Tatyana, PA-C      . alum & mag hydroxide-simeth (MAALOX/MYLANTA) 200-200-20 MG/5ML suspension 30 mL  30 mL Oral Q6H PRN Kirichenko, Tatyana, PA-C      . nicotine (NICODERM CQ - dosed in mg/24 hours) patch 21 mg  21 mg Transdermal Daily Kirichenko, Tatyana, PA-C      . ondansetron (ZOFRAN) tablet 4 mg  4 mg Oral Q8H PRN Jeannett Senior, PA-C       Current Outpatient Medications  Medication Sig Dispense Refill  . ALPRAZolam (XANAX) 1 MG tablet Take 1 mg by mouth daily as needed.  4  . amphetamine-dextroamphetamine (ADDERALL) 20 MG tablet Take 20 mg by mouth daily.  0  . ibuprofen (ADVIL,MOTRIN) 800 MG tablet Take 1 tablet (800 mg total) by mouth 3 (three) times daily. 21 tablet 0  . Ibuprofen-diphenhydrAMINE Cit (ADVIL PM) 200-38 MG TABS Take 2 tablets by mouth at bedtime as needed (pain).    Marland Kitchen  meclizine (ANTIVERT) 25 MG tablet Take 1 tablet (25 mg total) by mouth 3 (three) times daily as needed for dizziness. 30 tablet 0  . promethazine (PHENERGAN) 25 MG tablet Take 1 tablet (25 mg total) by mouth every 8 (eight) hours as needed for nausea or vomiting. 20 tablet 0  . amitriptyline (ELAVIL) 25 MG tablet Take 1 tablet (25 mg total) by mouth at bedtime. (Patient not taking: Reported on 04/20/2018) 10 tablet 0  . Buprenorphine  HCl-Naloxone HCl 8-2 MG FILM Place 1 tablet under the tongue daily.     . mometasone (NASONEX) 50 MCG/ACT nasal spray Place 2 sprays into the nose daily. (Patient not taking: Reported on 04/20/2018) 17 g 1  . ondansetron (ZOFRAN ODT) 4 MG disintegrating tablet Take 1 tablet (4 mg total) by mouth every 8 (eight) hours as needed for nausea. (Patient not taking: Reported on 04/20/2018) 10 tablet 0    Musculoskeletal: Strength & Muscle Tone: within normal limits Gait & Station: normal Patient leans: N/A  Psychiatric Specialty Exam: Physical Exam  Nursing note and vitals reviewed. Constitutional: He appears well-developed and well-nourished.  HENT:  Head: Normocephalic and atraumatic.  Neck: Normal range of motion.  Respiratory: Effort normal.  Musculoskeletal: Normal range of motion.  Neurological: He is alert.  Psychiatric: He has a normal mood and affect. His speech is normal. He is agitated. Thought content is paranoid and delusional. Cognition and memory are normal. He expresses impulsivity.    Review of Systems  Psychiatric/Behavioral: Positive for depression. Negative for hallucinations, memory loss, substance abuse and suicidal ideas. The patient is nervous/anxious. The patient does not have insomnia.   All other systems reviewed and are negative.   Blood pressure 131/89, pulse 67, temperature 98.8 F (37.1 C), temperature source Oral, resp. rate 16, SpO2 99 %.There is no height or weight on file to calculate BMI.  General Appearance: Casual  Eye Contact:  Good  Speech:  Clear and Coherent  Volume:  Normal  Mood:  Anxious, Depressed and Irritable  Affect:  Congruent  Thought Process:  Coherent and Linear  Orientation:  Full (Time, Place, and Person)  Thought Content:  Delusions  Suicidal Thoughts:  No  Homicidal Thoughts:  No  Memory:  Immediate;   Good Recent;   Fair Remote;   Fair  Judgement:  Poor  Insight:  Lacking  Psychomotor Activity:  Normal  Concentration:   Concentration: Fair and Attention Span: Fair  Recall:  Good  Fund of Knowledge:  Good  Language:  Good  Akathisia:  No  Handed:  Right  AIMS (if indicated):   N/A  Assets:  Agricultural consultant Housing Social Support  ADL's:  Intact  Cognition:  WNL  Sleep:   Fair     Treatment Plan Summary: Daily contact with patient to assess and evaluate symptoms and progress in treatment and Medication management ( see MAR )  Disposition: Recommend psychiatric Inpatient admission when medically cleared.  Ethelene Hal, NP 04/21/2018 10:55 AM   Patient seen face-to-face for psychiatric evaluation, chart reviewed and case discussed with the physician extender and developed treatment plan. Reviewed the information documented and agree with the treatment plan.  Buford Dresser, DO 04/21/18 4:33 PM

## 2018-04-21 NOTE — Progress Notes (Addendum)
Pt has been in bed most of the evening.  Writer went to pt's room to check on his and he was still in bed, but awake.  He asked about a snack, but when writer suggested that he come to the dayroom to get it, he initially declined.  Writer told him he could come and select a snack, get a sleep aid, then return to his room.  He was agreeable to this, and came out of his room for snack and med.  Pt remains flat and presents as suspicious and unsure of what is going on.  He denies SI/HI/AVH at this time.  He is new to the unit earlier this afternoon.  Support and encouragement offered.  Discharge plans are in process.  Pt was encouraged to make his needs known to staff.  Safety maintained with q15 minute checks.

## 2018-04-21 NOTE — ED Notes (Signed)
Patient did not sleep well during this shift. Patient has been withdrawn. Plan of care discussed. Encouragement and support provided and safety maintain. Q 15 min safety check and video monitoring.

## 2018-04-21 NOTE — BH Assessment (Addendum)
[facility]  Per Juanetta BeetsJacqueline Norman, DO, this pt requires psychiatric hospitalization.  Malva LimesLinsey Strader, RN, [facility]; she will [facility].  Pt presents under IVC initiated by pt's sister, and upheld by Dr Sharma CovertNorman, and IVC documents [facility].  Pt's nurse, Aram BeechamCynthia, has been notified, and agrees to call report to [phone].  Pt is to be transported via Patent examinerlaw enforcement.   Doylene Canninghomas Ozetta Flatley, [place] Behavioral Health Coordinator [phone]   Addendum:  At 13:34 Linsey calls to [facility]  Pt's nurse, Aram BeechamCynthia, has been notified.  Doylene Canninghomas Lil Lep[place], [place] Behavioral Health Coordinator [phone]

## 2018-04-21 NOTE — Progress Notes (Signed)
Patient ID: Maurice AcostaBenjamin V [name], male   DOB: [birth year], 32 y.o.   MRN: [id number] Patient denies SI, HI and AVH.  Patient was vague about what led up up admission and stated "I don't know why I am here."    Skin assessment complete.  Patient found to be free of injury.    Patient admitted to the unit without any issue.

## 2018-04-21 NOTE — ED Notes (Signed)
Report called to Engineer, siteonnie [facility].

## 2018-04-21 NOTE — Progress Notes (Signed)
Adult Psychoeducational Group Note  Date:  04/21/2018 Time:  8:37 PM  Group Topic/Focus:  Coping With Mental Health Crisis:   The purpose of this group is to help patients identify strategies for coping with mental health crisis.  Group discusses possible causes of crisis and ways to manage them effectively.  Participation Level:  Did Not Attend  Participation Quality:  Did not attend  Affect:  Did not attend  Cognitive:  Did not attend  Insight: None  Engagement in Group:  Did not attend  Modes of Intervention:  Did not attend  Additional Comments:  Did not attend  Lyndee HensenGoins, Caleyah Jr R 04/21/2018, 8:37 PM

## 2018-04-21 NOTE — Tx Team (Signed)
Initial Treatment Plan 04/21/2018 6:06 PM Maurice AcostaBenjamin V [name] AVW:[id number]    PATIENT STRESSORS: Medication change or noncompliance   PATIENT STRENGTHS: Communication skills   PATIENT IDENTIFIED PROBLEMS: "I dont know why I am here"                     DISCHARGE CRITERIA:  Improved stabilization in mood, thinking, and/or behavior Withdrawal symptoms are absent or subacute and managed without 24-hour nursing intervention  PRELIMINARY DISCHARGE PLAN: Return to previous living arrangement  PATIENT/FAMILY INVOLVEMENT: This treatment plan has been presented to and reviewed with the patient, Maurice AcostaBenjamin V [name], and/or family member,  The patient and family have been given the opportunity to ask questions and make suggestions.  Jerrye BushyLaRonica R Antoniette Peake, RN 04/21/2018, 6:06 PM

## 2018-04-22 DIAGNOSIS — R51 Headache: Secondary | ICD-10-CM

## 2018-04-22 DIAGNOSIS — F419 Anxiety disorder, unspecified: Secondary | ICD-10-CM

## 2018-04-22 DIAGNOSIS — R450 Nervousness: Secondary | ICD-10-CM

## 2018-04-22 DIAGNOSIS — F23 Brief psychotic disorder: Secondary | ICD-10-CM

## 2018-04-22 DIAGNOSIS — R454 Irritability and anger: Secondary | ICD-10-CM

## 2018-04-22 DIAGNOSIS — G4700 Insomnia, unspecified: Secondary | ICD-10-CM

## 2018-04-22 MED ORDER — GABAPENTIN 300 MG PO CAPS
300.0000 mg | ORAL_CAPSULE | Freq: Three times a day (TID) | ORAL | Status: DC
  Administered 2018-04-24 – 2018-04-26 (×2): 300 mg via ORAL
  Filled 2018-04-22 (×17): qty 1

## 2018-04-22 MED ORDER — IBUPROFEN 600 MG PO TABS
600.0000 mg | ORAL_TABLET | Freq: Four times a day (QID) | ORAL | Status: DC | PRN
  Administered 2018-04-25 – 2018-05-11 (×3): 600 mg via ORAL
  Filled 2018-04-22 (×3): qty 1

## 2018-04-22 MED ORDER — ARIPIPRAZOLE 15 MG PO TABS
15.0000 mg | ORAL_TABLET | Freq: Every day | ORAL | Status: DC
  Administered 2018-04-24 – 2018-04-27 (×4): 15 mg via ORAL
  Filled 2018-04-22 (×7): qty 1

## 2018-04-22 MED ORDER — ONDANSETRON HCL 4 MG PO TABS: 4.0000 mg | ORAL_TABLET | Freq: Three times a day (TID) | ORAL | Status: DC | PRN

## 2018-04-22 NOTE — Progress Notes (Signed)
Pt has been in his room in bed all evening.  He was awake when Clinical research associatewriter entered his room early in the shift.  He stated that he was fine, except that his throat was sore.  He declined to take any medication, but did ask for a cold pack to lay on his throat.  He denies SI/HI/AVH with Clinical research associatewriter.  He voiced no other needs or concerns.  Support and encouragement offered.  Discharge plans are in process.  Safety maintained with q15 minute checks.

## 2018-04-22 NOTE — Plan of Care (Signed)
  Problem: Education: Goal: Emotional status will improve Outcome: Not Progressing   
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
## 2018-04-22 NOTE — Progress Notes (Signed)
D Pt is observed lying in his bed in his room- with the lights off. HE does not uncover his head when he hears this nurse calling his name HE acts surprised...when nurse suggests he should take his scheduled meds at 1700.      A He says " I'm eating" but styrofoam container has patient's entire meal-untouched-in it. Pt refues all emds offered to him  By this Clinical research associatewriter. He does not completed his daily assessment.      R Safety is in place.

## 2018-04-22 NOTE — [facility] Suicide Risk Assessment (Signed)
[facility]   Nursing information obtained from:  Patient Demographic factors:  Male, Caucasian Current Mental Status:  NA Loss Factors:  NA Historical Factors:  NA Risk Reduction Factors:  Living with another person, especially a relative  Total Time spent with patient: 1 hour Principal Problem: Acute psychosis (HCC) Diagnosis:   Patient Active Problem List   Diagnosis Date Noted  . Acute psychosis (HCC) [F23] 04/21/2018  . Suicidal ideations [R45.851]   . Polysubstance (including opioids) dependence with physiological dependence (HCC) [F19.20] 04/02/2018  . 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 Delusions (HCC) [F22]    Subjective Data: see H&P for details  Continued Clinical Symptoms:  Alcohol Use Disorder Identification Test Final Score (AUDIT): 1 The "Alcohol Use Disorders Identification Test", Guidelines for Use in Primary Care, Second Edition.  World Science [employer] [facility](WHO). Score between 0-7:  no or low risk or alcohol related problems. Score between 8-15:  moderate risk of alcohol related problems. Score between 16-19:  high risk of alcohol related problems. Score 20 or above:  warrants further diagnostic evaluation for alcohol dependence and treatment.   CLINICAL FACTORS:   Severe Anxiety and/or Agitation Depression:   Comorbid alcohol abuse/dependence More than one psychiatric diagnosis Currently Psychotic Previous Psychiatric Diagnoses and Treatments Medical Diagnoses and Treatments/Surgeries  Psychiatric Specialty Exam: Physical Exam  Nursing note and vitals reviewed.   ROS- See H&P for details  Blood pressure (!) 141/90, pulse 90, temperature 98.9 F (37.2 C), resp. rate 18, height 5\' 8"  (1.727 m), weight 68 kg (150 lb).Body mass index is 22.81 kg/m.     COGNITIVE FEATURES THAT CONTRIBUTE TO RISK:  None    SUICIDE RISK:   Moderate:  Frequent suicidal ideation with limited intensity, and  duration, some specificity in terms of plans, no associated intent, good self-control, limited dysphoria/symptomatology, some risk factors present, and identifiable protective factors, including available and accessible social support.  PLAN OF CARE: See H&P for details  I certify that inpatient services furnished can reasonably be expected to improve the patient's condition.   Micheal Likenshristopher T Troy Kanouse, MD 04/22/2018, 2:29 PM

## 2018-04-22 NOTE — [facility] Suicide Risk Assessment (Signed)
[facility] INPATIENT:  Family/Significant Other Suicide Prevention Education  Suicide Prevention Education:  Education Completed; Maurice [name], mother, [phone] has been identified by the patient as the family member/significant other with whom the patient will be residing, and identified as the person(s) who will aid the patient in the event of a mental health crisis (suicidal ideations/suicide attempt).  With written consent from the patient, the family member/significant other has been provided the following suicide prevention education, prior to the and/or following the discharge of the patient.  The suicide prevention education provided includes the following:  Suicide risk factors  Suicide prevention and interventions  National Suicide Hotline telephone number  [facility] assessment telephone number  [name] 911  [facility] and/or [facility] telephone number  Request made of family/significant other to:  Remove weapons (e.g., guns, rifles, knives), all items previously/currently identified as safety concern.    Remove drugs/medications (over-the-counter, prescriptions, illicit drugs), all items previously/currently identified as a safety concern.  The family member/significant other verbalizes understanding of the suicide prevention education information provided.  The family member/significant other agrees to remove the items of safety concern listed above. "He's had some kind of psychotic episode which was very scary. And it was escalating. He had packed his stuff and we were fearful of his leaving.  He was prescribed pain meds years ago and developed dependence on that-it progressed to heroin. Maurice [name] was prescribing him adderral, benzos and suboxone.  We were out of town for awhile, and when we returned he had seemed to lose touch with reality. We don't know where his car his-he disappeared for 2 days and appeared back  at home on foot in the rain. Maurice [name] husband found a 22 and shells and we took that from him.We looked for the xanax, and we couldn't find it. We have guns, but they are locked away, and Ben can't get to them."  Maurice [name] 04/22/2018, 11:31 AM

## 2018-04-22 NOTE — Tx Team (Signed)
Interdisciplinary Treatment and Diagnostic Plan Update  04/22/2018 Time of Session: 3:00 PM  GRAVES NIPP MRN: [id number]  Principal Diagnosis: Acute psychosis [facility])  Secondary Diagnoses: Principal Problem:   Acute psychosis ([place])   Current Medications:  Current Facility-Administered Medications  Medication Dose Route Frequency Provider Last Rate Last Dose  . acetaminophen (TYLENOL) tablet 650 mg  650 mg Oral Q6H PRN Ethelene Hal, NP      . alum & mag hydroxide-simeth (MAALOX/MYLANTA) 200-200-20 MG/5ML suspension 30 mL  30 mL Oral Q4H PRN Ethelene Hal, NP      . ARIPiprazole (ABILIFY) tablet 15 mg  15 mg Oral Daily Pennelope Bracken, MD      . gabapentin (NEURONTIN) capsule 300 mg  300 mg Oral TID Pennelope Bracken, MD      . hydrOXYzine (ATARAX/VISTARIL) tablet 25 mg  25 mg Oral TID PRN Ethelene Hal, NP      . ibuprofen (ADVIL,MOTRIN) tablet 600 mg  600 mg Oral Q6H PRN Pennelope Bracken, MD      . ziprasidone (GEODON) injection 20 mg  20 mg Intramuscular Once PRN Rankin, Shuvon B, NP       And  . LORazepam (ATIVAN) tablet 1 mg  1 mg Oral Once PRN Rankin, Shuvon B, NP      . magnesium hydroxide (MILK OF MAGNESIA) suspension 30 mL  30 mL Oral Daily PRN Ethelene Hal, NP      . ondansetron [facility]) tablet 4 mg  4 mg Oral Q8H PRN Pennelope Bracken, MD      . traZODone (DESYREL) tablet 50 mg  50 mg Oral QHS PRN Ethelene Hal, NP   50 mg at 04/21/18 2111    PTA Medications: Medications Prior to Admission  Medication Sig Dispense Refill Last Dose  . ALPRAZolam (XANAX) 1 MG tablet Take 1 mg by mouth daily as needed.  4 04/20/2018 at Unknown time  . amitriptyline (ELAVIL) 25 MG tablet Take 1 tablet (25 mg total) by mouth at bedtime. (Patient not taking: Reported on 04/20/2018) 10 tablet 0 Not Taking at Unknown time  . amphetamine-dextroamphetamine (ADDERALL) 20 MG tablet Take 20 mg by mouth daily.  0 Past Week at  Unknown time  . Buprenorphine HCl-Naloxone HCl 8-2 MG FILM Place 1 tablet under the tongue daily.    04/01/2018 at Unknown time  . ibuprofen (ADVIL,MOTRIN) 800 MG tablet Take 1 tablet (800 mg total) by mouth 3 (three) times daily. 21 tablet 0 Past Week at Unknown time  . Ibuprofen-diphenhydrAMINE Cit (ADVIL PM) 200-38 MG TABS Take 2 tablets by mouth at bedtime as needed (pain).   Past Week at Unknown time  . meclizine (ANTIVERT) 25 MG tablet Take 1 tablet (25 mg total) by mouth 3 (three) times daily as needed for dizziness. 30 tablet 0 Past Week at Unknown time  . mometasone (NASONEX) 50 MCG/ACT nasal spray Place 2 sprays into the nose daily. (Patient not taking: Reported on 04/20/2018) 17 g 1 Not Taking at Unknown time  . ondansetron (ZOFRAN ODT) 4 MG disintegrating tablet Take 1 tablet (4 mg total) by mouth every 8 (eight) hours as needed for nausea. (Patient not taking: Reported on 04/20/2018) 10 tablet 0 Not Taking at Unknown time  . promethazine (PHENERGAN) 25 MG tablet Take 1 tablet (25 mg total) by mouth every 8 (eight) hours as needed for nausea or vomiting. 20 tablet 0 Past Week at Unknown time    Patient Stressors: Medication change or  noncompliance  Patient Strengths: Communication skills  Treatment Modalities: Medication Management, Group therapy, Case management,  1 to 1 session with clinician, Psychoeducation, Recreational therapy.   Physician Treatment Plan for Primary Diagnosis: Acute psychosis ([place]) Long Term Goal(s): Improvement in symptoms so as ready for discharge  Short Term Goals: Ability to identify and develop effective coping behaviors will improve Ability to identify changes in lifestyle to reduce recurrence of condition will improve  Medication Management: Evaluate patient's response, side effects, and tolerance of medication regimen.  Therapeutic Interventions: 1 to 1 sessions, Unit Group sessions and Medication administration.  Evaluation of Outcomes:  Progressing  Physician Treatment Plan for Secondary Diagnosis: Principal Problem:   Acute psychosis ([place])   Long Term Goal(s): Improvement in symptoms so as ready for discharge  Short Term Goals: Ability to identify and develop effective coping behaviors will improve Ability to identify changes in lifestyle to reduce recurrence of condition will improve  Medication Management: Evaluate patient's response, side effects, and tolerance of medication regimen.  Therapeutic Interventions: 1 to 1 sessions, Unit Group sessions and Medication administration.  Evaluation of Outcomes: Progressing   RN Treatment Plan for Primary Diagnosis: Acute psychosis ([place]) Long Term Goal(s): Knowledge of disease and therapeutic regimen to maintain health will improve  Short Term Goals: Ability to identify and develop effective coping behaviors will improve and Compliance with prescribed medications will improve  Medication Management: RN will administer medications as ordered by provider, will assess and evaluate patient's response and provide education to patient for prescribed medication. RN will report any adverse and/or side effects to prescribing provider.  Therapeutic Interventions: 1 on 1 counseling sessions, Psychoeducation, Medication administration, Evaluate responses to treatment, Monitor vital signs and CBGs as ordered, Perform/monitor CIWA, COWS, AIMS and Fall Risk screenings as ordered, Perform wound care treatments as ordered.  Evaluation of Outcomes: Progressing   LCSW Treatment Plan for Primary Diagnosis: Acute psychosis ([place]) Long Term Goal(s): Safe transition to appropriate next level of care at discharge, Engage patient in therapeutic group addressing interpersonal concerns.  Short Term Goals: Engage patient in aftercare planning with referrals and resources  Therapeutic Interventions: Assess for all discharge needs, 1 to 1 time with Social worker, Explore available resources and  support systems, Assess for adequacy in community support network, Educate family and significant other(s) on suicide prevention, Complete Psychosocial Assessment, Interpersonal group therapy.  Evaluation of Outcomes: Met  Return home, follow up [facility]   Progress in Treatment: Attending groups: No Participating in groups: No Taking medication as prescribed: Yes Toleration medication: Yes, no side effects reported at this time Family/Significant other contact made: Yes Patient understands diagnosis: No Limited insight Discussing patient identified problems/goals with staff: Yes Medical problems stabilized or resolved: Yes Denies suicidal/homicidal ideation: Yes Issues/concerns per patient self-inventory: None Other: N/A  New problem(s) identified: None identified at this time.   New Short Term/Long Term Goal(s): "I'm just here to relax."  Discharge Plan or Barriers:   Reason for Continuation of Hospitalization:  Delusions   Medication stabilization   Estimated Length of Stay: 6/19  Attendees: Patient: Maurice [name] 04/22/2018  3:00 PM  Physician: Maris Berger, MD 04/22/2018  3:00 PM  Nursing: Adonis Brook 04/22/2018  3:00 PM  RN Care Manager: Lars Pinks, RN 04/22/2018  3:00 PM  Social Worker: Ripley Fraise 04/22/2018  3:00 PM  Recreational Therapist: Winfield Cunas 04/22/2018  3:00 PM  Other: [name] 04/22/2018  3:00 PM  Other:  04/22/2018  3:00 PM    Scribe for Treatment  Team:  Roque Lias LCSW 04/22/2018 3:00 PM

## 2018-04-22 NOTE — [facility] Group Notes (Signed)
LCSW Group Therapy Note  04/22/2018 1:15pm  Type of Therapy and Topic:  Group Therapy:  Feelings around Relapse and Recovery  Participation Level:  Did Not Attend   Description of Group:    Patients in this group will discuss emotions they experience before and after a relapse. They will process how experiencing these feelings, or avoidance of experiencing them, relates to having a relapse. Facilitator will guide patients to explore emotions they have related to recovery. Patients will be encouraged to process which emotions are more powerful. They will be guided to discuss the emotional reaction significant others in their lives may have to their relapse or recovery. Patients will be assisted in exploring ways to respond to the emotions of others without this contributing to a relapse.  Therapeutic Goals: 1. Patient will identify two or more emotions that lead to a relapse for them 2. Patient will identify two emotions that result when they relapse 3. Patient will identify two emotions related to recovery 4. Patient will demonstrate ability to communicate their needs through discussion and/or role plays   Summary of Patient Progress:     Therapeutic Modalities:   Cognitive Behavioral Therapy Solution-Focused Therapy Assertiveness Training Relapse Prevention Therapy   Ida RogueRodney B Brysen Shankman, LCSW 04/22/2018 3:05 PM

## 2018-04-22 NOTE — H&P (Signed)
Psychiatric Admission Assessment Adult  Patient Identification: Maurice [name] MRN:  [id number] Date of Evaluation:  04/22/2018 Chief Complaint:  ACUTE PSYCHOSIS Principal Diagnosis: Psychosis ([place]) Diagnosis:   Patient Active Problem List   Diagnosis Date Noted  . Psychosis ([place]) [F29] 04/21/2018  . Suicidal ideations [R45.851]   . Polysubstance (including opioids) dependence with physiological dependence ([place]) [F19.20] 04/02/2018  . Substance-induced anxiety disorder ([place]) [F19.980] 04/02/2018  . Delusions ([place]) [F22]    History of Present Illness:   Maurice [name] is a 32 y/o M with history of treatment for depression, ADHD, and polysubstance abuse who was admitted from [facility] on IVC initiated by his sister due to worsening symptoms of psychosis and depression including reporting that he has been telepathically communicating with Tesla and Iona Beard as well reporting that they have been telling him to kill himself. Pt also reportedly asked his family for a gun. Pt has recent history of concussion about 2 weeks ago and he has continued to have vertigo from that incident (Head CT was negative for acute intracranial abnormality). Pt was medically stabilized and then transferred to [facility] for additional treatment and stabilization.  Upon initial interview, pt is irritable, minimizing, and only partially cooperative with intake interview. When asked what happened that lead up to coming to the hospital, pt shares, "I don't even remember. I haven't even talked to anyone in my family." Pt was asked directly about the concerns described in the IVC, and he denies ever reporting that he was telepathically communicating with anyone. He denies ever asking his family for a gun. He denies depressed mood. He endorses decreased energy, poor concentration, and poor appetite, but he otherwise denies all symptoms of depression. He denies SI/HI/AH/VH.  He denies all symptoms of mania, OCD, and PTSD. He denies all  recent illicit substance use, and he shares that he had been using alcohol, tobacco, and cannabis up until about 2 months ago.  Discussed with patient about treatment options. He follows up with Dr. Toy Care as an outpatient. He reports his only home medication is xanax 1-83m qDay which he does not take every day. He recently was also on adderall and suboxone, but he reports he stopped taking those about 2 months ago. Discussed with patient about starting on a medication to address his presenting symptoms of psychosis and mood symptoms, and he agreed to trial of abilify. We will also attempt trial of gabapentin for anxiety. Pt was in agreement with the above plan, and he had no further questions, comments, or concerns.   Associated Signs/Symptoms: Depression Symptoms:  difficulty concentrating, anxiety, decreased appetite, (Hypo) Manic Symptoms:  Irritable Mood, Anxiety Symptoms:  NA Psychotic Symptoms:  Delusions, Hallucinations: Auditory PTSD Symptoms: NA Total Time spent with patient: 1 hour  Past Psychiatric History:   - Pt is unsure of his previous psychiatric diagnoses, but as per chart review he has been treated for ADHD, opiate use disorder, and anxiety - Pt is unsure if he has previous inpatient stays - Pt sees Dr. KToy Carefor outpt treatment. - He denies hx of suicide attempt  Is the patient at risk to self? Yes.    Has the patient been a risk to self in the past 6 months? Yes.    Has the patient been a risk to self within the distant past? Yes.    Is the patient a risk to others? Yes.    Has the patient been a risk to others in the past 6 months? Yes.  Has the patient been a risk to others within the distant past? Yes.     Prior Inpatient Therapy:   Prior Outpatient Therapy:    Alcohol Screening: 1. How often do you have a drink containing alcohol?: Monthly or less 2. How many drinks containing alcohol do you have on a typical day when you are drinking?: 1 or 2 3. How often  do you have six or more drinks on one occasion?: Never AUDIT-C Score: 1 4. How often during the last year have you found that you were not able to stop drinking once you had started?: Never 5. How often during the last year have you failed to do what was normally expected from you becasue of drinking?: Never 6. How often during the last year have you needed a first drink in the morning to get yourself going after a heavy drinking session?: Never 7. How often during the last year have you had a feeling of guilt of remorse after drinking?: Never 8. How often during the last year have you been unable to remember what happened the night before because you had been drinking?: Never 9. Have you or someone else been injured as a result of your drinking?: No 10. Has a relative or friend or a doctor or another health worker been concerned about your drinking or suggested you cut down?: No Alcohol Use Disorder Identification Test Final Score (AUDIT): 1 Substance Abuse History in the last 12 months:  Yes.   Consequences of Substance Abuse: Medical Consequences:  worsened mood and psychotic symptoms Previous Psychotropic Medications: Yes  Psychological Evaluations: Yes  Past Medical History:  Past Medical History:  Diagnosis Date  . Anxiety   . Substance abuse ([place])    History reviewed. No pertinent surgical history. Family History: History reviewed. No pertinent family history. Family Psychiatric  History: denies family psychiatric history. Tobacco Screening:   Social History: Pt was born and raised in [place]. He lives with his parents. He completed a BA in history from [employer]. 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 He has no children or significant other. He denies legal history. He declines to answer when asked about trauma history. Social History   Substance and Sexual Activity  Alcohol Use Not Currently  . Alcohol/week: 0.0 oz     Social History   Substance and Sexual  Activity  Drug Use No    Additional Social History: What is your sexual orientation?: heterosexaul Does patient have children?: No                         Allergies:   Allergies  Allergen Reactions  . Other Nausea And Vomiting    Red meat products   Lab Results:  Results for orders placed or performed during the hospital encounter of 04/20/18 (from the past 48 hour(s))  Comprehensive metabolic panel     Status: Abnormal   Collection Time: 04/20/18  2:40 PM  Result Value Ref Range   Sodium 139 135 - 145 mmol/L   Potassium 4.4 3.5 - 5.1 mmol/L   Chloride 103 101 - 111 mmol/L   CO2 29 22 - 32 mmol/L   Glucose, Bld 108 (H) 65 - 99 mg/dL   BUN 15 6 - 20 mg/dL   Creatinine, Ser 0.84 0.61 - 1.24 mg/dL   Calcium 9.1 8.9 - 10.3 mg/dL   Total Protein 7.9 6.5 - 8.1 g/dL   Albumin 4.8 3.5 - 5.0 g/dL  AST 18 15 - 41 U/L   ALT 21 17 - 63 U/L   Alkaline Phosphatase 71 38 - 126 U/L   Total Bilirubin 0.5 0.3 - 1.2 mg/dL   GFR calc non Af Amer >60 >60 mL/min   GFR calc Af Amer >60 >60 mL/min    Comment: (NOTE) The eGFR has been calculated using the CKD EPI equation. This calculation has not been validated in all clinical situations. eGFR's persistently <60 mL/min signify possible Chronic Kidney Disease.    Anion gap 7 5 - 15    Comment: Performed at [facility], [place] [address] [place], [place] [address]  Ethanol     Status: None   Collection Time: 04/20/18  2:40 PM  Result Value Ref Range   Alcohol, Ethyl (B) <10 <10 mg/dL    Comment: (NOTE) Lowest detectable limit for serum alcohol is 10 mg/dL. For medical purposes only. Performed at [facility], [place] [address] [place], [place] [address]   Salicylate level     Status: None   Collection Time: 04/20/18  2:40 PM  Result Value Ref Range   Salicylate Lvl <5.5 2.8 - 30.0 mg/dL    Comment: Performed at [facility], [place] [address], [place], [place] [address]   Acetaminophen level     Status: Abnormal   Collection Time: 04/20/18  2:40 PM  Result Value Ref Range   Acetaminophen (Tylenol), Serum <10 (L) 10 - 30 ug/mL    Comment: (NOTE) Therapeutic concentrations vary significantly. A range of 10-30 ug/mL  may be an effective concentration for many patients. However, some  are best treated at concentrations outside of this range. Acetaminophen concentrations >150 ug/mL at 4 hours after ingestion  and >50 ug/mL at 12 hours after ingestion are often associated with  toxic reactions. Performed at [facility], [place] [address] [place], [place] [address]   cbc     Status: None   Collection Time: 04/20/18  2:40 PM  Result Value Ref Range   WBC 7.8 4.0 - 10.5 K/uL   RBC 4.91 4.22 - 5.81 MIL/uL   Hemoglobin 16.2 13.0 - 17.0 g/dL   HCT 47.6 39.0 - 52.0 %   MCV 96.9 78.0 - 100.0 fL   MCH 33.0 26.0 - 34.0 pg   MCHC 34.0 30.0 - 36.0 g/dL   RDW 11.9 11.5 - 15.5 %   Platelets 284 150 - 400 K/uL    Comment: Performed at [facility], [place] [address], [place], [place] [address]  Rapid urine drug screen (hospital performed)     Status: Abnormal   Collection Time: 04/20/18  3:38 PM  Result Value Ref Range   Opiates NONE DETECTED NONE DETECTED   Cocaine NONE DETECTED NONE DETECTED   Benzodiazepines POSITIVE (A) NONE DETECTED   Amphetamines NONE DETECTED NONE DETECTED   Tetrahydrocannabinol NONE DETECTED NONE DETECTED   Barbiturates NONE DETECTED NONE DETECTED    Comment: (NOTE) DRUG SCREEN FOR MEDICAL PURPOSES ONLY.  IF CONFIRMATION IS NEEDED FOR ANY PURPOSE, NOTIFY LAB WITHIN 5 DAYS. LOWEST DETECTABLE LIMITS FOR URINE DRUG SCREEN Drug Class                     Cutoff (ng/mL) Amphetamine and metabolites    1000 Barbiturate and metabolites    200 Benzodiazepine                 201 Tricyclics and metabolites  300 Opiates and metabolites        300 Cocaine and metabolites        300 THC                             50 Performed at [facility] [address], [place],  [address]     Blood Alcohol level:  Lab Results  Component Value Date   ETH <10 87/86/7672    Metabolic Disorder Labs:  No results found for: HGBA1C, MPG No results found for: PROLACTIN No results found for: CHOL, TRIG, HDL, CHOLHDL, VLDL, LDLCALC  Current Medications: Current Facility-Administered Medications  Medication Dose Route Frequency Provider Last Rate Last Dose  . acetaminophen (TYLENOL) tablet 650 mg  650 mg Oral Q6H PRN Ethelene Hal, NP      . alum & mag hydroxide-simeth (MAALOX/MYLANTA) 200-200-20 MG/5ML suspension 30 mL  30 mL Oral Q4H PRN Ethelene Hal, NP      . ARIPiprazole (ABILIFY) tablet 15 mg  15 mg Oral Daily Pennelope Bracken, MD      . gabapentin (NEURONTIN) capsule 300 mg  300 mg Oral TID Pennelope Bracken, MD      . hydrOXYzine (ATARAX/VISTARIL) tablet 25 mg  25 mg Oral TID PRN Ethelene Hal, NP      . ibuprofen (ADVIL,MOTRIN) tablet 600 mg  600 mg Oral Q6H PRN Pennelope Bracken, MD      . ziprasidone (GEODON) injection 20 mg  20 mg Intramuscular Once PRN Rankin, Shuvon B, NP       And  . LORazepam (ATIVAN) tablet 1 mg  1 mg Oral Once PRN Rankin, Shuvon B, NP      . magnesium hydroxide (MILK OF MAGNESIA) suspension 30 mL  30 mL Oral Daily PRN Ethelene Hal, NP      . ondansetron [facility]) tablet 4 mg  4 mg Oral Q8H PRN Pennelope Bracken, MD      . traZODone (DESYREL) tablet 50 mg  50 mg Oral QHS PRN Ethelene Hal, NP   50 mg at 04/21/18 2111   PTA Medications: Medications Prior to Admission  Medication Sig Dispense Refill Last Dose  . ALPRAZolam (XANAX) 1 MG tablet Take 1 mg by mouth daily as needed.  4 04/20/2018 at Unknown time  . amitriptyline (ELAVIL) 25 MG tablet Take 1 tablet (25 mg total) by mouth at bedtime. (Patient not taking: Reported on 04/20/2018) 10 tablet 0 Not Taking at Unknown time  .  amphetamine-dextroamphetamine (ADDERALL) 20 MG tablet Take 20 mg by mouth daily.  0 Past Week at Unknown time  . Buprenorphine HCl-Naloxone HCl 8-2 MG FILM Place 1 tablet under the tongue daily.    04/01/2018 at Unknown time  . ibuprofen (ADVIL,MOTRIN) 800 MG tablet Take 1 tablet (800 mg total) by mouth 3 (three) times daily. 21 tablet 0 Past Week at Unknown time  . Ibuprofen-diphenhydrAMINE Cit (ADVIL PM) 200-38 MG TABS Take 2 tablets by mouth at bedtime as needed (pain).   Past Week at Unknown time  . meclizine (ANTIVERT) 25 MG tablet Take 1 tablet (25 mg total) by mouth 3 (three) times daily as needed for dizziness. 30 tablet 0 Past Week at Unknown time  . mometasone (NASONEX) 50 MCG/ACT nasal spray Place 2 sprays into the nose daily. (Patient not taking: Reported on 04/20/2018) 17 g 1 Not Taking at Unknown time  . ondansetron (ZOFRAN ODT)  4 MG disintegrating tablet Take 1 tablet (4 mg total) by mouth every 8 (eight) hours as needed for nausea. (Patient not taking: Reported on 04/20/2018) 10 tablet 0 Not Taking at Unknown time  . promethazine (PHENERGAN) 25 MG tablet Take 1 tablet (25 mg total) by mouth every 8 (eight) hours as needed for nausea or vomiting. 20 tablet 0 Past Week at Unknown time    Musculoskeletal: Strength & Muscle Tone: within normal limits Gait & Station: normal Patient leans: N/A  Psychiatric Specialty Exam: Physical Exam  Nursing note and vitals reviewed.   Review of Systems  Constitutional: Negative for chills and fever.  Respiratory: Negative for cough and shortness of breath.   Cardiovascular: Negative for chest pain.  Gastrointestinal: Negative for abdominal pain, heartburn, nausea and vomiting.  Neurological: Positive for headaches.  Psychiatric/Behavioral: Negative for depression, hallucinations and suicidal ideas. The patient is nervous/anxious. The patient does not have insomnia.     Blood pressure (!) 141/90, pulse 90, temperature 98.9 F (37.2 C), resp.  rate 18, height '5\' 8"'  (1.727 m), weight 68 kg (150 lb).Body mass index is 22.81 kg/m.  General Appearance: Casual and Fairly Groomed  Eye Contact:  Minimal  Speech:  Clear and Coherent and Normal Rate  Volume:  Normal  Mood:  Irritable  Affect:  Congruent and Constricted  Thought Process:  Coherent and Goal Directed  Orientation:  Full (Time, Place, and Person)  Thought Content:  Hallucinations: Auditory  Suicidal Thoughts:  No  Homicidal Thoughts:  No  Memory:  Immediate;   Fair Recent;   Fair Remote;   Fair  Judgement:  Poor  Insight:  Lacking  Psychomotor Activity:  Normal  Concentration:  Concentration: Fair  Recall:  [employer] of Knowledge:  Fair  Language:  Fair  Akathisia:  No  Handed:    AIMS (if indicated):     Assets:  Housing Resilience  ADL's:  Intact  Cognition:  WNL  Sleep:  Number of Hours: 4.5   Treatment Plan Summary: Daily contact with patient to assess and evaluate symptoms and progress in treatment and Medication management  Observation Level/Precautions:  15 minute checks  Laboratory:  CBC Chemistry Profile HbAIC UDS UA  Psychotherapy:  Encourage participation in groups and therapeutic milieu   Medications:  Start abilify 3m po qDay. Start gabapentin 3019mpo TID. Start vistaril 2535mo TID prn anxiety. Start trazodone 65m52m qhs prn insomnia. Continue agitation protocol with geodon/ativan.  Consultations:    Discharge Concerns:    Estimated LOS: 5-7 days  Other:     Physician Treatment Plan for Primary Diagnosis: Psychosis ([facility])[place] Term Goal(s): Improvement in symptoms so as ready for discharge  Short Term Goals: Ability to identify and develop effective coping behaviors will improve  Physician Treatment Plan for Secondary Diagnosis: Principal Problem:   Psychosis ([facility])[place] Term Goal(s): Improvement in symptoms so as ready for discharge  Short Term Goals: Ability to identify changes in lifestyle to reduce recurrence of condition  will improve  I certify that inpatient services furnished can reasonably be expected to improve the patient's condition.    ChriPennelope Bracken 6/14/20192:15 PM

## 2018-04-22 NOTE — Progress Notes (Signed)
Recreation Therapy Notes  Date: 6.14.19 Time: 1000 Location: 500 Hall Dayroom  Group Topic: Self-Esteem  Goal Area(s) Addresses:  Patient will successfully identify positive attributes about themselves.  Patient will successfully identify benefit of improved self-esteem.   Intervention: Magazines, scissors, glue sticks, Holiday representativeconstruction paper  Activity: Collage About Me.  Patients were instructed to use the supplies provided to create a collage of things that describe them.  Education:  Self-Esteem, Building control surveyorDischarge Planning.   Education Outcome: Acknowledges education/In group clarification offered/Needs additional education  Clinical Observations/Feedback: Pt did not attend group.    Caroll RancherMarjette Wajiha Versteeg, LRT/CTRS         Caroll RancherLindsay, Joshalyn Ancheta A 04/22/2018 11:56 AM

## 2018-04-23 DIAGNOSIS — Z8659 Personal history of other mental and behavioral disorders: Secondary | ICD-10-CM

## 2018-04-23 MED ORDER — LORAZEPAM 2 MG/ML IJ SOLN
1.0000 mg | Freq: Once | INTRAMUSCULAR | Status: AC
  Administered 2018-04-23: 1 mg via INTRAMUSCULAR

## 2018-04-23 MED ORDER — OLANZAPINE 10 MG PO TBDP: 10.0000 mg | ORAL_TABLET | Freq: Three times a day (TID) | ORAL | Status: DC | PRN

## 2018-04-23 MED ORDER — LORAZEPAM 1 MG PO TABS
1.0000 mg | ORAL_TABLET | Freq: Four times a day (QID) | ORAL | Status: DC | PRN
  Administered 2018-04-28 – 2018-05-03 (×8): 1 mg via ORAL
  Filled 2018-04-23 (×10): qty 1

## 2018-04-23 MED ORDER — LORAZEPAM 2 MG/ML IJ SOLN
INTRAMUSCULAR | Status: AC
  Administered 2018-04-23: 1 mg via INTRAMUSCULAR
  Filled 2018-04-23: qty 1

## 2018-04-23 MED ORDER — LORAZEPAM 1 MG PO TABS: 1.0000 mg | ORAL_TABLET | Freq: Once | ORAL | Status: AC

## 2018-04-23 NOTE — Progress Notes (Signed)
D: Patient presents depressed, paranoid, somatic, anxious. He states "I haven't slept in four days." He also c/o diarrhea, and was asked to not flush it so we could assess. He did not comply with this and refused any intervention for diarrhea. He also c/o of a headache 8/10, but refused any medication for such. He also refused his morning medications (Abilify and Gabapentin), and later required PRN administration of Geodon to help with paranoia. "I just don't like to take meds I don't know anything about." Denies SI/HI/AVH. He denies receiving information through telepathy. "I just take Xanax" at home. BP was elevated, but lower on re-check.  A: Provided thorough education on medications, patient not agreeable to PO medication administration. Patient checked q15 min, and checks reviewed. Educated patient on importance of attending group therapy sessions and educated on several coping skills. Encouarged participation in milieu through recreation therapy and attending meals with peers. Support and encouragement provided. R: Patient isolating to room. Minimal interaction. Cooperative with IM administration. Patient contracts for safety on the unit.

## 2018-04-23 NOTE — Progress Notes (Signed)
Did not attend group 

## 2018-04-23 NOTE — Progress Notes (Signed)
[facility] Opinion Progress Note for Medication Administration to Non-consenting Patients (For Involuntarily Committed Patients)  Patient: Maurice AcostaBenjamin V Pies Date of Birth: [id number] MRN: [id number]  Reason for the Medication: The patient, without the benefit of the specific treatment measure, is incapable of participating in any available treatment plan that will give the patient a realistic opportunity of improving the patient's condition.  Consideration of Side Effects: Consideration of the side effects related to the medication plan has been given.  Rationale for Medication Administration: The patient has a history of paranoia and insomnia x5 nights at home and delusional thought processes.  He has been refusing oral meds and has been given IM Geodon after barricading himself in the room.  Now he is drowsy but still disoriented and not thinking clearly.  The patient undoubtedly will show a little improvement without antipsychotic medication and may become dangerously disorganized.  For this reason IM medication may be needed if he refuses the offering of oral medicine for psychosis    Diannia Rudereborah Felissa Blouch, MD 04/23/18  3:37 PM   This documentation is good for (7) seven days from the date of the MD signature. New documentation must be completed every seven (7) days with detailed justification in the medical record if the patient requires continued non-emergent administration of psychotropic medications.

## 2018-04-23 NOTE — Plan of Care (Signed)
Patient is paranoid, noncompliant with medications and not attending group therapy sessions. He isolates to room. He has poor insight into his illness. "I thought what was on the TV was going to happen." He reports feeling frightened. Reassurance provided.

## 2018-04-23 NOTE — Progress Notes (Signed)
D: Patient barricading himself in his room, holding the door closed. When asked to open the door, he refused. Staff was able to open the door outward. On next 15 minute round, patient barricaded himself in the bathroom. PO medication was offered, patient refused. PO or IM medication option was given, as patient became increasingly agitated at requests to perform proper 15 minute checks and visualize the patient. He refused.  A: Staff support called and patient agreed to take only IM medication (Geodon 20mg  and Ativan 1mg ). Administered IM medication, no physical hold needed. R: Patient allowed staff to perform next 15 minute check without incident.

## 2018-04-23 NOTE — Progress Notes (Addendum)
[facility] MD Progress Note  04/23/2018 2:26 PM Maurice [name]  MRN:  [id number]   Subjective:  Maurice [name] awake alert oriented x3.  Seen resting in bed.  Presents with paranoia ideations  and reported delusions.  Patient presents guarded and flat. States he has been experiencing diarrhea for the past 3 weeks and is unable to tolerate any food at this time.  Patient observed eating crackers and drinking water.  States he is unsure how he got to the hospital states he was taken out of the back  the back and forth to this hospital and his parents is unaware of where he needs that at this time.  Patient reports he only prescribed  Xanax for severe anxiety.  Patient continues to refuse medications.  NP requested attending psychiatrist to follow-up for evaluation.  Consider forced medication options.  Reported patient barricading himself behind closed door.  Agitation protocol was initiated.  Back to continue monitoring vitals and patient's behavior.  Support and encouragement reassurance was provided.  History: Per assessment note Keneth Borg is a 32 y/o M with history of treatment for depression, ADHD, and polysubstance abuse who was admitted from [facility] on IVC initiated by his sister due to worsening symptoms of psychosis and depression including reporting that he has been telepathically communicating with [employer] and Beverly Sessions as well reporting that they have been telling him to kill himself. Pt also reportedly asked his family for a gun. Pt has recent history of concussion about 2 weeks ago and he has continued to have vertigo from that incident (Head CT was negative for acute intracranial abnormality). Pt was medically stabilized and then transferred to [facility] for additional treatment and stabilization.  Principal Problem: Acute psychosis (HCC) Diagnosis:   Patient Active Problem List   Diagnosis Date Noted  . Acute psychosis (HCC) [F23] 04/21/2018  . Suicidal ideations [R45.851]   . Polysubstance (including  opioids) dependence with physiological dependence (HCC) [F19.20] 04/02/2018  . 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 Delusions (HCC) [F22]    Total Time spent with patient: 20 minutes  Past Psychiatric History:   Past Medical History:  Past Medical History:  Diagnosis Date  . Anxiety   . Substance abuse (HCC)    History reviewed. No pertinent surgical history. Family History: History reviewed. No pertinent family history. Family Psychiatric  History:  Social History:  Social History   Substance and Sexual Activity  Alcohol Use Not Currently  . Alcohol/week: 0.0 oz     Social History   Substance and Sexual Activity  Drug Use No    Social History   Socioeconomic History  . Marital status: Single    Spouse name: Not on file  . Number of children: Not on file  . Years of education: Not on file  . Highest education level: Not on file  Occupational History  . Not on file  Social Needs  . Financial resource strain: Not on file  . Food insecurity:    Worry: Not on file    Inability: Not on file  . Transportation needs:    Medical: Not on file    Non-medical: Not on file  Tobacco Use  . Smoking status: Former Games developer  . Smokeless tobacco: Never Used  Substance and Sexual Activity  . Alcohol use: Not Currently    Alcohol/week: 0.0 oz  . Drug use: No  . Sexual activity: Not on file  Lifestyle  . Physical activity:    Days per week: Not on  file    Minutes per session: Not on file  . Stress: Not on file  Relationships  . Social connections:    Talks on phone: Not on file    Gets together: Not on file    Attends religious service: Not on file    Active member of club or organization: Not on file    Attends meetings of clubs or organizations: Not on file    Relationship status: Not on file  Other Topics Concern  . Not on file  Social History Narrative  . Not on file   Additional Social History:                          Sleep: Fair  Appetite:  Fair  Current Medications: Current Facility-Administered Medications  Medication Dose Route Frequency Provider Last Rate Last Dose  . acetaminophen (TYLENOL) tablet 650 mg  650 mg Oral Q6H PRN Laveda Abbe, NP      . alum & mag hydroxide-simeth (MAALOX/MYLANTA) 200-200-20 MG/5ML suspension 30 mL  30 mL Oral Q4H PRN Laveda Abbe, NP      . ARIPiprazole (ABILIFY) tablet 15 mg  15 mg Oral Daily Micheal Likens, MD      . gabapentin (NEURONTIN) capsule 300 mg  300 mg Oral TID Micheal Likens, MD      . hydrOXYzine (ATARAX/VISTARIL) tablet 25 mg  25 mg Oral TID PRN Laveda Abbe, NP      . ibuprofen (ADVIL,MOTRIN) tablet 600 mg  600 mg Oral Q6H PRN Micheal Likens, MD      . LORazepam (ATIVAN) tablet 1 mg  1 mg Oral Once PRN Rankin, Shuvon B, NP      . LORazepam (ATIVAN) tablet 1 mg  1 mg Oral Q6H PRN Oneta Rack, NP      . magnesium hydroxide (MILK OF MAGNESIA) suspension 30 mL  30 mL Oral Daily PRN Laveda Abbe, NP      . OLANZapine zydis (ZYPREXA) disintegrating tablet 10 mg  10 mg Oral Q8H PRN Oneta Rack, NP      . ondansetron (ZOFRAN) tablet 4 mg  4 mg Oral Q8H PRN Micheal Likens, MD      . traZODone (DESYREL) tablet 50 mg  50 mg Oral QHS PRN Laveda Abbe, NP   50 mg at 04/21/18 2111    Lab Results: No results found for this or any previous visit (from the past 48 hour(s)).  Blood Alcohol level:  Lab Results  Component Value Date   ETH <10 04/20/2018    Metabolic Disorder Labs: No results found for: HGBA1C, MPG No results found for: PROLACTIN No results found for: CHOL, TRIG, HDL, CHOLHDL, VLDL, LDLCALC  Physical Findings: AIMS: Facial and Oral Movements Muscles of Facial Expression: None, normal Lips and Perioral Area: None, normal Jaw: None, normal Tongue: None, normal,Extremity Movements Upper (arms, wrists, hands, fingers): None, normal Lower (legs, knees,  ankles, toes): None, normal, Trunk Movements Neck, shoulders, hips: None, normal, Overall Severity Severity of abnormal movements (highest score from questions above): None, normal Incapacitation due to abnormal movements: None, normal Patient's awareness of abnormal movements (rate only patient's report): No Awareness, Dental Status Current problems with teeth and/or dentures?: No Does patient usually wear dentures?: No  CIWA:  CIWA-Ar Total: 3 COWS:  COWS Total Score: 3  Musculoskeletal: Strength & Muscle Tone: within normal limits Gait & Station: normal Patient leans: N/A  Psychiatric Specialty Exam: Physical Exam  Nursing note and vitals reviewed. Constitutional: He is oriented to person, place, and time.  Neurological: He is alert and oriented to person, place, and time.  Psychiatric: He has a normal mood and affect.    Review of Systems  Psychiatric/Behavioral: Positive for hallucinations. The patient is nervous/anxious.   All other systems reviewed and are negative.   Blood pressure (!) 132/91, pulse (!) 104, temperature 99 F (37.2 C), temperature source Oral, resp. rate 16, height 5\' 8"  (1.727 m), weight 68 kg (150 lb).Body mass index is 22.81 kg/m.  General Appearance: Casual, Disheveled and Guarded  Eye Contact:  Fair  Speech:  Clear and Coherent  Volume:  Normal  Mood:  Anxious, Depressed and Dysphoric  Affect:  Flat  Thought Process:  Disorganized and Descriptions of Associations: Tangential  Orientation:  Full (Time, Place, and Person)  Thought Content:  Hallucinations: Auditory, Paranoid Ideation and Rumination  Suicidal Thoughts:  No  Homicidal Thoughts:  No  Memory:  Immediate;   Fair Recent;   Fair Remote;   Fair  Judgement:  Fair  Insight:  Fair  Psychomotor Activity:  Normal  Concentration:  Concentration: Fair  Recall:  [employer]  Fund of Knowledge:  Fair  Language:  Fair  Akathisia:  No  Handed:  Right  AIMS (if indicated):     Assets:   Communication Skills Desire for Improvement Social Support  ADL's:  Intact  Cognition:  WNL  Sleep:  Number of Hours: 3.75     Treatment Plan Summary: Daily contact with patient to assess and evaluate symptoms and progress in treatment and Medication management   Continue with current treatment plan as listed below on 04/23/2018 except for noted  Attending psychiatrist to follow-up for review/evaluation and consider forced medication options.   Mood stabilization:  Continue Abilify 15 mg p.o. Daily  Continue gabapentin 300 mg p.o. 3 times daily  Insomnia: Continue trazodone 50 mg p.o. Nightly  Agitation protocol:  See chart    Oneta Rackanika N Lewis, NP 04/23/2018, 2:26 PM   Agree with NP progress note. As reviewed with RN staff, patient refusing  PO medication-he required PRN medication ( Geodon) for agitation, barricading self in bathroom, not responding to verbal de-escalation efforts. Patient is  now sleeping . 6/13 EKG QTc 417. Would consider medication over objection based on psychosis, agitation, disorganized behavior and have requested 2nd opinion from Dr. Tenny Crawoss   [facility]

## 2018-04-23 NOTE — [facility] Group Notes (Signed)
  [facility] LCSW Group Therapy Note  Date/Time:  04/23/2018 11:15AM-12:00PM  Type of Therapy and Topic:  Group Therapy:  Feelings About Hospitalization  Participation Level:  Did Not Attend   Description of Group This process group involved patients discussing their feelings related to being hospitalized, as well as the benefits they see to being in the hospital.  These feelings and benefits were itemized.  The group then brainstormed specific ways in which they could seek those same benefits when they discharge and return home.  Therapeutic Goals 1. Patient will identify and describe positive and negative feelings related to hospitalization 2. Patient will verbalize benefits of hospitalization to themselves personally 3. Patients will brainstorm together ways they can obtain similar benefits in the outpatient setting, identify barriers to wellness and possible solutions  Summary of Patient Progress:  N/A  Therapeutic Modalities Cognitive Behavioral Therapy Motivational Interviewing    Ambrose MantleMareida Grossman-Orr, LCSW 04/23/2018, 8:13 AM

## 2018-04-24 NOTE — Plan of Care (Signed)
Continues to isolate in room, refuses medications. RN will continue to monitor

## 2018-04-24 NOTE — Plan of Care (Signed)
Patient is isolating in room and not participating in group therapies or meals with peers. He is showing continued paranoia and no improvement in symptoms. He continues to have difficulty sleeping, and had 0 hours of sleep recorded last night.

## 2018-04-24 NOTE — Progress Notes (Signed)
[facility] MD Progress Note  04/24/2018 11:54 AM Maurice [name]  MRN:  [id number]   Subjective:  Maurice [name] seen sitting in bed responding to internal stimuli. Present flat and guard. Reports he is "feeling a lot better today" was reported that patient was initially refusing medication however after discussion with NP patient was agreeable to take Abilify  medications by mouth.  Continues to presents with paranoia however is pleasant.  Reports patient appetite had improved on yesterday.  Patient's mood appeared to improve after agitation protocol patient was seen interacting throughout the milieu on yesterday evening.  Staff to continue to monitor behaviors.  No reported medication side effects noted.  Support encouragement reassurance was provided.  History: Per assessment note Maurice [name] is a 32 y/o M with history of treatment for depression, ADHD, and polysubstance abuse who was admitted from [facility] on IVC initiated by his sister due to worsening symptoms of psychosis and depression including reporting that he has been telepathically communicating with Maurice [name] and Maurice [name] as well reporting that they have been telling him to kill himself. Pt also reportedly asked his family for a gun. Pt has recent history of concussion about 2 weeks ago and he has continued to have vertigo from that incident (Head CT was negative for acute intracranial abnormality). Pt was medically stabilized and then transferred to [facility] for additional treatment and stabilization.  Principal Problem: Acute psychosis (HCC) Diagnosis:   Patient Active Problem List   Diagnosis Date Noted  . Acute psychosis (HCC) [F23] 04/21/2018  . Suicidal ideations [R45.851]   . Polysubstance (including opioids) dependence with physiological dependence (HCC) [F19.20] 04/02/2018  . 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 Delusions (HCC) [F22]    Total Time spent with patient: 20 minutes  Past Psychiatric History:   Past  Medical History:  Past Medical History:  Diagnosis Date  . Anxiety   . Substance abuse (HCC)    History reviewed. No pertinent surgical history. Family History: History reviewed. No pertinent family history. Family Psychiatric  History:  Social History:  Social History   Substance and Sexual Activity  Alcohol Use Not Currently  . Alcohol/week: 0.0 oz     Social History   Substance and Sexual Activity  Drug Use No    Social History   Socioeconomic History  . Marital status: Single    Spouse name: Not on file  . Number of children: Not on file  . Years of education: Not on file  . Highest education level: Not on file  Occupational History  . Not on file  Social Needs  . Financial resource strain: Not on file  . Food insecurity:    Worry: Not on file    Inability: Not on file  . Transportation needs:    Medical: Not on file    Non-medical: Not on file  Tobacco Use  . Smoking status: Former Games developer  . Smokeless tobacco: Never Used  Substance and Sexual Activity  . Alcohol use: Not Currently    Alcohol/week: 0.0 oz  . Drug use: No  . Sexual activity: Not on file  Lifestyle  . Physical activity:    Days per week: Not on file    Minutes per session: Not on file  . Stress: Not on file  Relationships  . Social connections:    Talks on phone: Not on file    Gets together: Not on file    Attends religious service: Not on file    Active member of  club or organization: Not on file    Attends meetings of clubs or organizations: Not on file    Relationship status: Not on file  Other Topics Concern  . Not on file  Social History Narrative  . Not on file   Additional Social History:                         Sleep: Fair  Appetite:  Fair  Current Medications: Current Facility-Administered Medications  Medication Dose Route Frequency Provider Last Rate Last Dose  . acetaminophen (TYLENOL) tablet 650 mg  650 mg Oral Q6H PRN Laveda Abbe, NP       . alum & mag hydroxide-simeth (MAALOX/MYLANTA) 200-200-20 MG/5ML suspension 30 mL  30 mL Oral Q4H PRN Laveda Abbe, NP      . ARIPiprazole (ABILIFY) tablet 15 mg  15 mg Oral Daily Micheal Likens, MD   15 mg at 04/24/18 0901  . gabapentin (NEURONTIN) capsule 300 mg  300 mg Oral TID Micheal Likens, MD   300 mg at 04/24/18 0901  . hydrOXYzine (ATARAX/VISTARIL) tablet 25 mg  25 mg Oral TID PRN Laveda Abbe, NP      . ibuprofen (ADVIL,MOTRIN) tablet 600 mg  600 mg Oral Q6H PRN Micheal Likens, MD      . LORazepam (ATIVAN) tablet 1 mg  1 mg Oral Once PRN Rankin, Shuvon B, NP      . LORazepam (ATIVAN) tablet 1 mg  1 mg Oral Q6H PRN Oneta Rack, NP      . magnesium hydroxide (MILK OF MAGNESIA) suspension 30 mL  30 mL Oral Daily PRN Laveda Abbe, NP      . OLANZapine zydis (ZYPREXA) disintegrating tablet 10 mg  10 mg Oral Q8H PRN Oneta Rack, NP      . ondansetron (ZOFRAN) tablet 4 mg  4 mg Oral Q8H PRN Micheal Likens, MD      . traZODone (DESYREL) tablet 50 mg  50 mg Oral QHS PRN Laveda Abbe, NP   50 mg at 04/21/18 2111    Lab Results: No results found for this or any previous visit (from the past 48 hour(s)).  Blood Alcohol level:  Lab Results  Component Value Date   ETH <10 04/20/2018    Metabolic Disorder Labs: No results found for: HGBA1C, MPG No results found for: PROLACTIN No results found for: CHOL, TRIG, HDL, CHOLHDL, VLDL, LDLCALC  Physical Findings: AIMS: Facial and Oral Movements Muscles of Facial Expression: None, normal Lips and Perioral Area: None, normal Jaw: None, normal Tongue: None, normal,Extremity Movements Upper (arms, wrists, hands, fingers): None, normal Lower (legs, knees, ankles, toes): None, normal, Trunk Movements Neck, shoulders, hips: None, normal, Overall Severity Severity of abnormal movements (highest score from questions above): None, normal Incapacitation due to abnormal  movements: None, normal Patient's awareness of abnormal movements (rate only patient's report): No Awareness, Dental Status Current problems with teeth and/or dentures?: No Does patient usually wear dentures?: No  CIWA:  CIWA-Ar Total: 3 COWS:  COWS Total Score: 4  Musculoskeletal: Strength & Muscle Tone: within normal limits Gait & Station: normal Patient leans: N/A     Psychiatric Specialty Exam: Physical Exam  Nursing note and vitals reviewed. Constitutional: He is oriented to person, place, and time.  Neurological: He is alert and oriented to person, place, and time.  Psychiatric: He has a normal mood and affect.    Review of  Systems  Psychiatric/Behavioral: Positive for hallucinations. The patient is nervous/anxious.   All other systems reviewed and are negative.   Blood pressure (!) 144/83, pulse (!) 105, temperature 98.6 F (37 C), resp. rate 18, height 5\' 8"  (1.727 m), weight 68 kg (150 lb).Body mass index is 22.81 kg/m.  General Appearance: Casual, Disheveled and Guarded  Eye Contact:  Fair  Speech:  Clear and Coherent  Volume:  Normal  Mood:  Anxious, Depressed and Dysphoric  Affect:  Flat  Thought Process:  Disorganized and Descriptions of Associations: Tangential  Orientation:  Full (Time, Place, and Person)  Thought Content:  Hallucinations: Auditory, Paranoid Ideation and Rumination  Suicidal Thoughts:  No  Homicidal Thoughts:  No  Memory:  Immediate;   Fair Recent;   Fair Remote;   Fair  Judgement:  Fair  Insight:  Fair  Psychomotor Activity:  Normal  Concentration:  Concentration: Fair  Recall:  [employer]Fair  Fund of Knowledge:  Fair  Language:  Fair  Akathisia:  No  Handed:  Right  AIMS (if indicated):     Assets:  Communication Skills Desire for Improvement Social Support  ADL's:  Intact  Cognition:  WNL  Sleep:  Number of Hours: 0    Treatment Plan Summary: Daily contact with patient to assess and evaluate symptoms and progress in treatment and  Medication management   Continue with current treatment plan as listed below on 04/24/2018 except for noted Mood stabilization:   Continue Abilify 15 mg p.o. Daily  Continue gabapentin 300 mg p.o. 3 times daily  Continue Zyprexa  10 mg PRN agitation  Insomnia:  Continue trazodone 50 mg p.o. Nightly  Agitation protocol:  Continue Ativan detox protocol   Attending psychiatrist to follow-up for review/evaluation and consider forced medication options.   CSW to continue to work on discharge disposition  Encouraged to attend therapeutic milieu and daily group [name]  Oneta Rackanika N Lewis, NP 04/24/2018, 11:54 AM

## 2018-04-24 NOTE — Progress Notes (Signed)
[facility] Group Notes:  (Nursing/MHT/Case Management/Adjunct)  Date:  04/24/2018  Time:  3:22 PM  Type of Therapy:  Psychoeducational Skills  Participation Level:  Did Not Attend  Summary of Progress/Problems: Patient did not attend afternoon psychoeducation group on healthy boundary setting.  Reygan Heagle C Ranette Luckadoo 04/24/2018, 3:22 PM 

## 2018-04-24 NOTE — [facility] Group Notes (Signed)
Adult Psychoeducational Group Note  Date:  04/24/2018 Time:  10:00 AM  Group Topic/Focus: Love Language Healthy Communication:   The focus of this group is to discuss communication, barriers to communication, as well as healthy ways to communicate with others.  Participation Level:  Did Not Attend  Modes of Intervention:  Discussion and Education  Additional Comments:  Patient was invited but declined to attend group.  Thalia Turkington A Roald Lukacs 04/24/2018, 10:30 AM 

## 2018-04-24 NOTE — [facility] Group Notes (Signed)
Adult Psychoeducational Group Note  Date:  04/24/2018 Time:  9:30 AM  Group Topic/Focus: Orientation/Goals Group Orientation:   The focus of this group is to educate the patient on the purpose and policies of crisis stabilization and provide a format to answer questions about their admission.  The group details unit policies and expectations of patients while admitted. Goals Group:   The focus of this group is to help patients establish daily goals to achieve during treatment and discuss how the patient can incorporate goal setting into their daily lives to aide in recovery.   Participation Level:  Did Not Attend  Modes of Intervention:  Discussion and Education  Additional Comments:  Patient was invited but declined to attend group.  Ruston Fedora A Jalaina Salyers 04/24/2018, 10:00 AM 

## 2018-04-24 NOTE — Progress Notes (Signed)
Pt has parents visiting.  Pt is confused and unable to determine what day it is.  Pt is out in hallway after parents leave, asking the same questions over and over again and returns to room each time.  Pt does not attend group and is in room looking at the door.  Pt is paranoid and non compliant with meds and resistant to redirection. Pt mostly isolates in his room. Pt denies any needs. Pt remains safe on unit

## 2018-04-24 NOTE — Progress Notes (Signed)
D: Patient continues to have symptoms of paranoia. He initially refused medications this morning, but after speaking with the provider, he agreed to take Abilify and Gabapentin PO. When handed the medication, he was visibly anxious and fearful, tremulous. He did take the medication. When rounding on the patient, he was asked if he needed anything, and he would say "no" then mumble some words. He appeared agitated, but displayed no aggressive behavior. He continues to go without sleep, and only slept a few minutes during shift change this morning. His BP is elevated at 146/89 sitting.  A: Offered reassurance and support. Patient checked q15 min, and checks reviewed. Reviewed medication changes with patient and educated on side effects. Educated patient on importance of attending group therapy sessions and educated on several coping skills. Encouarged participation in milieu through recreation therapy and attending meals with peers. Fluids offered. R: Patient was not interested in medication education and was reluctant to take his medications. He is not participating in group therapy, nor attending meals with peers. He did not fill out a self-inventory. Patient contracts for safety on the unit.

## 2018-04-24 NOTE — Progress Notes (Signed)
Pt c/o insomnia and anxiety but refused PRN medication for sleep and anxiety after education and prompts/ redirection from staff. RN will continue to monitor.

## 2018-04-24 NOTE — [facility] Group Notes (Signed)
[facility] LCSW Group Therapy Note  Date/Time:  04/24/2018  11:00AM-12:00PM  Type of Therapy and Topic:  Group Therapy:  Music and Mood  Participation Level:  Did Not Attend   Description of Group: In this process group, members listened to a variety of genres of music and identified that different types of music evoke different responses.  Patients were encouraged to identify music that was soothing for them and music that was energizing for them.  Patients discussed how this knowledge can help with wellness and recovery in various ways including managing depression and anxiety as well as encouraging healthy sleep habits.    Therapeutic Goals: 1. Patients will explore the impact of different varieties of music on mood 2. Patients will verbalize the thoughts they have when listening to different types of music 3. Patients will identify music that is soothing to them as well as music that is energizing to them 4. Patients will discuss how to use this knowledge to assist in maintaining wellness and recovery 5. Patients will explore the use of music as a coping skill  Summary of Patient Progress:  N/A  Therapeutic Modalities: Solution Focused Brief Therapy Activity   Bilal Manzer Grossman-Orr, LCSW    

## 2018-04-25 DIAGNOSIS — Z87891 Personal history of nicotine dependence: Secondary | ICD-10-CM

## 2018-04-25 DIAGNOSIS — R451 Restlessness and agitation: Secondary | ICD-10-CM

## 2018-04-25 DIAGNOSIS — F29 Unspecified psychosis not due to a substance or known physiological condition: Secondary | ICD-10-CM

## 2018-04-25 MED ORDER — HYDROXYZINE HCL 50 MG PO TABS
50.0000 mg | ORAL_TABLET | Freq: Four times a day (QID) | ORAL | Status: DC | PRN
  Administered 2018-04-25: 50 mg via ORAL
  Filled 2018-04-25 (×3): qty 1

## 2018-04-25 MED ORDER — TRAZODONE HCL 100 MG PO TABS
100.0000 mg | ORAL_TABLET | Freq: Every evening | ORAL | Status: DC | PRN
  Administered 2018-04-28 – 2018-05-02 (×5): 100 mg via ORAL
  Filled 2018-04-25 (×4): qty 1

## 2018-04-25 MED ORDER — SIMETHICONE 80 MG PO CHEW: 160.0000 mg | CHEWABLE_TABLET | Freq: Four times a day (QID) | ORAL | Status: DC | PRN

## 2018-04-25 NOTE — Progress Notes (Signed)
Nursing note 7p-7a  Pt observed isolating in room majority of this shift. Pt did agree to medication administration at HS and he sat in the day room and ate his snack. Pt displayed a flat affect and anxious mood upon interaction with this Clinical research associatewriter. Pt complains of back pain 4/10 as well as anxiety/ insomnia. See MAR for PRN administration. Pt denies SI/HI, and also denies audio or visual hallucinations at this time.   Pt able to contract for safety. Goal: to sleep.  Pt is now resting in bed with eyes closed with no signs or symptoms of pain or distress noted. Pt continues to remain safe on the unit and is observed by rounding every 15 min. RN will continue to monitor.

## 2018-04-25 NOTE — Progress Notes (Signed)
Recreation Therapy Notes  Date: 6.17.19 Time: 1000 Location: 500 Hall Dayroom  Group Topic: Triggers  Goal Area(s) Addresses:  Patient will be able to identify triggers.  Patient will identify problems caused by triggers. Patient will identify three coping skills to deal with triggers.  Intervention: Worksheet  Activity: Triggers.  LRT gave patients a worksheet in which patients were to identify their triggers, the problems their triggers contribute to and come up with a trigger for each category (emotional state, people, places, things, thoughts and activities/situations) presented.  Education:Communication, Discharge Planning  Education Outcome: Acknowledges understanding/In group clarification offered/Needs additional education.   Clinical Observations/Feedback:  Pt did not attend group.    Caroll RancherMarjette Aland Chestnutt, LRT/CTRS       Caroll RancherLindsay, Aracelia Brinson A 04/25/2018 12:34 PM

## 2018-04-25 NOTE — Progress Notes (Signed)
Nursing note 7p-7a  Pt observed isolating in room this shift. Displayed a flat affect and anxious and paranoid mood upon interaction with this Clinical research associatewriter. Pt denies pain ,denies SI/HI, and also denies audio or visual hallucinations at this time. Pt complains of anxiety and insomnia but refused to take prn medications when offered. Pt educated on medications/ redirected but continued to refuse administration.  Pt able to contract for safety. Pt now resting in bed awake upon rounding with no signs or symptoms of pain or distress noted. Pt continues to remain safe on the unit and is observed by rounding every 15 min. RN will continue to monitor.

## 2018-04-25 NOTE — Progress Notes (Signed)
Adult Psychoeducational Group Note  Date:  04/25/2018 Time:  10:03 PM  Group Topic/Focus:  Wrap-Up Group:   The focus of this group is to help patients review their daily goal of treatment and discuss progress on daily workbooks.  Participation Level:  Did Not Attend  Participation Quality:  Did not attend  Affect:  Did not attend  Cognitive:  Did not attend  Insight: None  Engagement in Group:  Did not attend  Modes of Intervention:  Did not attend  Additional Comments:  Pt did not attend evening wrap up group tonight.  Felipa FurnaceChristopher  Daphne Karrer 04/25/2018, 10:03 PM

## 2018-04-25 NOTE — [facility] Group Notes (Signed)
LCSW Group Therapy Note   04/25/2018 1:15pm   Type of Therapy and Topic:  Group Therapy:  Overcoming Obstacles   Participation Level:  Did Not Attend   Description of Group:    In this group patients will be encouraged to explore what they see as obstacles to their own wellness and recovery. They will be guided to discuss their thoughts, feelings, and behaviors related to these obstacles. The group will process together ways to cope with barriers, with attention given to specific choices patients can make. Each patient will be challenged to identify changes they are motivated to make in order to overcome their obstacles. This group will be process-oriented, with patients participating in exploration of their own experiences as well as giving and receiving support and challenge from other group members.   Therapeutic Goals: 1. Patient will identify personal and current obstacles as they relate to admission. 2. Patient will identify barriers that currently interfere with their wellness or overcoming obstacles.  3. Patient will identify feelings, thought process and behaviors related to these barriers. 4. Patient will identify two changes they are willing to make to overcome these obstacles:      Summary of Patient Progress      Therapeutic Modalities:   Cognitive Behavioral Therapy Solution Focused Therapy Motivational Interviewing Relapse Prevention Therapy  Maurice RogueRodney B Keirsten Matuska, LCSW 04/25/2018 2:54 PM

## 2018-04-25 NOTE — Progress Notes (Signed)
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, ride bikes)  Frequency of Recreation/Participation:    Biochemist, clinicalAwareness of Community Resources:     WalgreenCommunity Resources:     Current Use:    If no, Barriers?:    Expressed Interest in State Street CorporationCommunity Resource Information:    [place]  Patient Main Form of Transportation:    Patient Strengths:  Playing guitar/drums; hockey  Patient Identified Areas of Improvement:     Patient Goal for Hospitalization:     Current SI (including self-harm):  No  Current HI:  No  Current AVH: No  Staff Intervention Plan: Group Attendance, Collaborate with Interdisciplinary Treatment Team  Consent to Intern Participation: N/A   Caroll RancherMarjette Devonte Migues, LRT/CTRS  Caroll RancherLindsay, Namari Breton A 04/25/2018, 3:26 PM

## 2018-04-25 NOTE — Plan of Care (Signed)
Pt continues to progress towards goals and d/c. RN will continue to monitor.  

## 2018-04-25 NOTE — Progress Notes (Signed)
[facility] MD Progress Note  04/25/2018 4:22 PM Maurice [name]  MRN:  [id number] Subjective:    Maurice [name] is a 32 y/o M with history of treatment for depression, ADHD, and polysubstance abuse who was admitted from [facility] on IVC initiated by his sister due to worsening symptoms of psychosis and depression including reporting that he has been telepathically communicating with [employer] and Beverly Sessions as well reporting that they have been telling him to kill himself. Pt also reportedly asked his family for a gun. Pt has recent history of concussion about 2 weeks ago and he has continued to have vertigo from that incident (Head CT was negative for acute intracranial abnormality). Pt was medically stabilized and then transferred to [facility] for additional treatment and stabilization. He was started on trial of abilify and gabapentin, and he initially was refusing those medication. He barricaded himself in his room on 6/15, and he required IM medications to manage his agitation. Second opinion was obtained for forced medications; however, pt has been adherent to his medications since yesterday.  Today upon interview, pt shares, "I'm doing pretty well - a little tired." RN staff recorded only 1 hour of sleep last night. He shares that his main concern today is "I have a lot of anxiety." Discussed with patient about availability of vistaril as a PRN which would be preferable to home medication of alprazolam, and pt verbalized understanding. He denies SI/HI/AH/VH. Discussed with patient about importance of taking the prescribed medications or he will have to have medications against his weill, and he verbalized good understanding (he initially refused abilify this AM, but later took it around noon). Discussed with patient that we will keep his regimen the same today, but we will increase dose of trazodone at bedtime to help with insomnia, and he was in agreement. He had no further questions, comments, or  concerns.  Principal Problem: Acute psychosis (HCC) Diagnosis:   Patient Active Problem List   Diagnosis Date Noted  . Acute psychosis (HCC) [F23] 04/21/2018  . Suicidal ideations [R45.851]   . Polysubstance (including opioids) dependence with physiological dependence (HCC) [F19.20] 04/02/2018  . 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 Delusions (HCC) [F22]    Total Time spent with patient: 30 minutes  Past Psychiatric History: see H&P  Past Medical History:  Past Medical History:  Diagnosis Date  . Anxiety   . Substance abuse (HCC)    History reviewed. No pertinent surgical history. Family History: History reviewed. No pertinent family history. Family Psychiatric  History: see H&P Social History:  Social History   Substance and Sexual Activity  Alcohol Use Not Currently  . Alcohol/week: 0.0 oz     Social History   Substance and Sexual Activity  Drug Use No    Social History   Socioeconomic History  . Marital status: Single    Spouse name: Not on file  . Number of children: Not on file  . Years of education: Not on file  . Highest education level: Not on file  Occupational History  . Not on file  Social Needs  . Financial resource strain: Not on file  . Food insecurity:    Worry: Not on file    Inability: Not on file  . Transportation needs:    Medical: Not on file    Non-medical: Not on file  Tobacco Use  . Smoking status: Former Games developer  . Smokeless tobacco: Never Used  Substance and Sexual Activity  . Alcohol use: Not Currently  Alcohol/week: 0.0 oz  . Drug use: No  . Sexual activity: Not on file  Lifestyle  . Physical activity:    Days per week: Not on file    Minutes per session: Not on file  . Stress: Not on file  Relationships  . Social connections:    Talks on phone: Not on file    Gets together: Not on file    Attends religious service: Not on file    Active member of club or organization: Not on file     Attends meetings of clubs or organizations: Not on file    Relationship status: Not on file  Other Topics Concern  . Not on file  Social History Narrative  . Not on file   Additional Social History:                         Sleep: Poor  Appetite:  Good  Current Medications: Current Facility-Administered Medications  Medication Dose Route Frequency Provider Last Rate Last Dose  . acetaminophen (TYLENOL) tablet 650 mg  650 mg Oral Q6H PRN Laveda AbbeParks, Laurie Britton, NP      . alum & mag hydroxide-simeth (MAALOX/MYLANTA) 200-200-20 MG/5ML suspension 30 mL  30 mL Oral Q4H PRN Laveda AbbeParks, Laurie Britton, NP      . ARIPiprazole (ABILIFY) tablet 15 mg  15 mg Oral Daily Micheal Likensainville, Dymir Neeson T, MD   15 mg at 04/25/18 1140  . gabapentin (NEURONTIN) capsule 300 mg  300 mg Oral TID Micheal Likensainville, Gino Garrabrant T, MD   300 mg at 04/24/18 0901  . hydrOXYzine (ATARAX/VISTARIL) tablet 50 mg  50 mg Oral Q6H PRN Micheal Likensainville, Letoya Stallone T, MD      . ibuprofen (ADVIL,MOTRIN) tablet 600 mg  600 mg Oral Q6H PRN Micheal Likensainville, Jekhi Bolin T, MD      . LORazepam (ATIVAN) tablet 1 mg  1 mg Oral Once PRN Rankin, Shuvon B, NP      . LORazepam (ATIVAN) tablet 1 mg  1 mg Oral Q6H PRN Oneta RackLewis, Tanika N, NP      . magnesium hydroxide (MILK OF MAGNESIA) suspension 30 mL  30 mL Oral Daily PRN Laveda AbbeParks, Laurie Britton, NP      . OLANZapine zydis (ZYPREXA) disintegrating tablet 10 mg  10 mg Oral Q8H PRN Oneta RackLewis, Tanika N, NP      . ondansetron (ZOFRAN) tablet 4 mg  4 mg Oral Q8H PRN Micheal Likensainville, Junia Nygren T, MD      . traZODone (DESYREL) tablet 50 mg  50 mg Oral QHS PRN Laveda AbbeParks, Laurie Britton, NP   50 mg at 04/21/18 2111    Lab Results: No results found for this or any previous visit (from the past 48 hour(s)).  Blood Alcohol level:  Lab Results  Component Value Date   ETH <10 04/20/2018    Metabolic Disorder Labs: No results found for: HGBA1C, MPG No results found for: PROLACTIN No results found for: CHOL, TRIG, HDL, CHOLHDL,  VLDL, LDLCALC  Physical Findings: AIMS: Facial and Oral Movements Muscles of Facial Expression: None, normal Lips and Perioral Area: None, normal Jaw: None, normal Tongue: None, normal,Extremity Movements Upper (arms, wrists, hands, fingers): None, normal Lower (legs, knees, ankles, toes): None, normal, Trunk Movements Neck, shoulders, hips: None, normal, Overall Severity Severity of abnormal movements (highest score from questions above): None, normal Incapacitation due to abnormal movements: None, normal Patient's awareness of abnormal movements (rate only patient's report): No Awareness, Dental Status Current problems with teeth and/or dentures?: No Does  patient usually wear dentures?: No  CIWA:  CIWA-Ar Total: 3 COWS:  COWS Total Score: 4  Musculoskeletal: Strength & Muscle Tone: within normal limits Gait & Station: normal Patient leans: N/A  Psychiatric Specialty Exam: Physical Exam  Nursing note and vitals reviewed.   Review of Systems  Constitutional: Negative for chills and fever.  Respiratory: Negative for cough and shortness of breath.   Cardiovascular: Negative for chest pain.  Gastrointestinal: Negative for abdominal pain, heartburn, nausea and vomiting.  Psychiatric/Behavioral: Negative for depression, hallucinations and suicidal ideas. The patient is not nervous/anxious and does not have insomnia.     Blood pressure (!) 141/91, pulse (!) 101, temperature 98.1 F (36.7 C), temperature source Oral, resp. rate 20, height 5\' 8"  (1.727 m), weight 68 kg (150 lb).Body mass index is 22.81 kg/m.  General Appearance: Casual and Fairly Groomed  Eye Contact:  Good  Speech:  Clear and Coherent and Normal Rate  Volume:  Normal  Mood:  Euthymic  Affect:  Congruent, Constricted and Flat  Thought Process:  Coherent and Goal Directed  Orientation:  Full (Time, Place, and Person)  Thought Content:  Logical  Suicidal Thoughts:  No  Homicidal Thoughts:  No  Memory:   Immediate;   Fair Recent;   Fair Remote;   Fair  Judgement:  Poor  Insight:  Lacking  Psychomotor Activity:  Normal  Concentration:  Concentration: Fair  Recall:  [employer] of Knowledge:  Fair  Language:  Fair  Akathisia:  No  Handed:    AIMS (if indicated):     Assets:  Resilience  ADL's:  Intact  Cognition:  WNL  Sleep:  Number of Hours: 1   Treatment Plan Summary: Daily contact with patient to assess and evaluate symptoms and progress in treatment and Medication management   -Continue inpatient hospitalization  -Unspecified psychotic disorder   -Continue abilify 15mg  po qDay  -Anxiety     -Continue gabapentin 300mg  po TID   -Continue vistaril 50mg  po q6h prn anxiety  -Agitation    -Continue zydis 10mg  po q8h prn agitation  -Continue Ativan 1mg  po q6h prn agitation  -Insomnia   -Change trazodone 50mg  po qhs prn insomnia to trazodone 100mg  po qhs PRN isomnia (may repeat x1)  -Encourage participation in groups and therapeutic milieu  -Disposition planning will be ongoing  Micheal Likens, MD 04/25/2018, 4:22 PM

## 2018-04-26 DIAGNOSIS — Z9114 Patient's other noncompliance with medication regimen: Secondary | ICD-10-CM

## 2018-04-26 NOTE — Progress Notes (Addendum)
Patient is not going to meals.  He is coming up to the nurse's station and requesting a tray.  He was informed that he needs to go to the cafeteria, as it is part of his treatment.  Patient refused a tray and asked for a snack.  He took several snacks to his room.  He is telling his parents that he is not getting anything to eat.  Mother called and asked if she could bring him a milkshake.  Informed her that outside food is not allowed on the unit. Patient has been completed isolative to his room and refusing his gabapentin.  He is taking the abilify with a lot of resistance.

## 2018-04-26 NOTE — Progress Notes (Signed)
Recreation Therapy Notes  Date: 6.18.19 Time: 1000 Location: 500 Hall Dayroom   Group Topic: Communication, Team Building, Problem Solving  Goal Area(s) Addresses:  Patient will effectively work with peer towards shared goal.  Patient will identify skills used to make activity successful.  Patient will identify how skills used during activity can be used to reach post d/c goals.   Intervention: STEM Activity  Activity: Landing Pad. In teams patients were given 12 plastic drinking straws and a length of masking tape. Using the materials provided patients were asked to build a landing pad to catch a golf ball dropped from approximately 6 feet in the air.   Education: Social Skills, Discharge Planning   Education Outcome: Acknowledges education/In group clarification offered/Needs additional education.   Clinical Observations/Feedback: Pt did not attend group.    Zaydn Gutridge, LRT/CTRS         Twania Bujak A 04/26/2018 11:58 AM 

## 2018-04-26 NOTE — Plan of Care (Signed)
  Problem: Physical Regulation: Goal: Ability to maintain clinical measurements within normal limits will improve Outcome: Progressing   Problem: Education: Goal: Emotional status will improve Outcome: Not Progressing Goal: Mental status will improve Outcome: Not Progressing Goal: Verbalization of understanding the information provided will improve Outcome: Not Progressing   Problem: Activity: Goal: Interest or engagement in activities will improve Outcome: Not Progressing   Problem: Health Behavior/Discharge Planning: Goal: Compliance with treatment plan for underlying cause of condition will improve Outcome: Not Progressing Note:  Patient refuses to attend groups.   Problem: Coping: Goal: Will verbalize feelings Outcome: Not Progressing   Problem: Health Behavior/Discharge Planning: Goal: Compliance with prescribed medication regimen will improve Outcome: Not Progressing Note:  Patient is refusing some of his medications.

## 2018-04-26 NOTE — Progress Notes (Signed)
Nursing Progress Note: 7p-7a D: Pt currently presents with a ambivalent/paranoid affect and behavior. Pt states "I am not taking meds because I don't need medicine." Interacting appropriately with the milieu. Pt reports good sleep during the previous night with current medication regimen. Pt did not attend wrap-up group.  A: Pt provided with medications per providers orders. Pt's labs and vitals were monitored throughout the night. Pt supported emotionally and encouraged to express concerns and questions. Pt educated on medications.  R: Pt's safety ensured with 15 minute and environmental checks. Pt currently denies SI, HI, and AVH. Pt verbally contracts to seek staff if SI,HI, or AVH occurs and to consult with staff before acting on any harmful thoughts. Will continue to monitor.

## 2018-04-26 NOTE — Progress Notes (Signed)
[facility]  04/26/2018 1:05 PM Maurice AcostaBenjamin V [name]  MRN:  [id number] Subjective:    Maurice SchoonerBenjamin [name] is a 32 y/o M with history of treatment for depression, ADHD, and polysubstance abuse who was admitted from [facility] on IVC initiated by his sister due to worsening symptoms of psychosis and depression including reporting that he has been telepathically communicating with Tesla and Beverly SessionsJohn Lennon as well reporting that they have been telling him to kill himself. Pt also reportedly asked his family for a gun. Pt has recent history of concussion about 2 weeks ago and he has continued to have vertigo from that incident (Head CT was negative for acute intracranial abnormality). Pt was medically stabilized and [facility]. He was started on trial of abilify and gabapentin, and he initially was refusing those medication. He barricaded himself in his room on 6/15, and he required IM medications to manage his agitation. Second opinion was obtained for forced medications; however, pt has been taking abilify (though he has been refusing gabapentin).   Today upon interview, pt shares, "I'm alright - just a little sleepy." Pt denies any specific concerns today. He denies physical complaints. He denies SI/HI/AH/VH. He endorses having some paranoia earlier in his stay, saying, "Someone said that we want to kill you." However, pt reports that paranoia has improved significantly. Pt has continued to have partial adherence to his medications, as he has been taking abilify but refusing gabapentin. Pt makes multiple statements about how he plans to stay "detoxed" from medications, and he is unlikely to continue taking any medication after discharge. Discussed with patient that his family has expressed concerns about his behaviors prior to admission including SI, disorganized behaviors, and delusions. Discussed with patient about potential option of long-acting injectable of  Abilify Maintena as he has demonstrated improvement of his presenting symptoms and his sleep has improved as well; however, pt declines this option, stating, "I've just been detoxing my self from all these medications, so why would I take something that lasts a month?" Attempted to discuss with patient about importance of staying on medication for mood stabilization and psychotic symptoms. Encouraged pt to discuss this option with his family, whom had previously expressed concern about his medication adherence and symptoms prior to admission, and pt shares that he plans to speak with them. For the time being he is in agreement to continue his current regimen without changes, and pt was encouraged to take all of his medications as currently prescribed.  Principal Problem: Acute psychosis (HCC) Diagnosis:   Patient Active Problem List   Diagnosis Date Noted  . Acute psychosis (HCC) [F23] 04/21/2018  . Suicidal ideations [R45.851]   . Polysubstance (including opioids) dependence with physiological dependence (HCC) [F19.20] 04/02/2018  . 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 Delusions (HCC) [F22]    Total Time spent with patient: 30 minutes  Past Psychiatric History: see H&P  Past Medical History:  Past Medical History:  Diagnosis Date  . Anxiety   . Substance abuse (HCC)    History reviewed. No pertinent surgical history. Family History: History reviewed. No pertinent family history. Family Psychiatric  History: see H&P Social History:  Social History   Substance and Sexual Activity  Alcohol Use Not Currently  . Alcohol/week: 0.0 oz     Social History   Substance and Sexual Activity  Drug Use No    Social History   Socioeconomic History  . Marital status: Single  Spouse name: Not on file  . Number of children: Not on file  . Years of education: Not on file  . Highest education level: Not on file  Occupational History  . Not on file  Social Needs   . Financial resource strain: Not on file  . Food insecurity:    Worry: Not on file    Inability: Not on file  . Transportation needs:    Medical: Not on file    Non-medical: Not on file  Tobacco Use  . Smoking status: Former Games developer  . Smokeless tobacco: Never Used  Substance and Sexual Activity  . Alcohol use: Not Currently    Alcohol/week: 0.0 oz  . Drug use: No  . Sexual activity: Not on file  Lifestyle  . Physical activity:    Days per week: Not on file    Minutes per session: Not on file  . Stress: Not on file  Relationships  . Social connections:    Talks on phone: Not on file    Gets together: Not on file    Attends religious service: Not on file    Active member of club or organization: Not on file    Attends meetings of clubs or organizations: Not on file    Relationship status: Not on file  Other Topics Concern  . Not on file  Social History Narrative  . Not on file   Additional Social History:                         Sleep: Fair  Appetite:  Good  Current Medications: Current Facility-Administered Medications  Medication Dose Route Frequency Provider Last Rate Last Dose  . acetaminophen (TYLENOL) tablet 650 mg  650 mg Oral Q6H PRN Laveda Abbe, NP      . alum & mag hydroxide-simeth (MAALOX/MYLANTA) 200-200-20 MG/5ML suspension 30 mL  30 mL Oral Q4H PRN Laveda Abbe, NP      . ARIPiprazole (ABILIFY) tablet 15 mg  15 mg Oral Daily Micheal Likens, MD   15 mg at 04/26/18 0942  . gabapentin (NEURONTIN) capsule 300 mg  300 mg Oral TID Micheal Likens, MD   300 mg at 04/26/18 0942  . hydrOXYzine (ATARAX/VISTARIL) tablet 50 mg  50 mg Oral Q6H PRN Micheal Likens, MD   50 mg at 04/25/18 2159  . ibuprofen (ADVIL,MOTRIN) tablet 600 mg  600 mg Oral Q6H PRN Micheal Likens, MD   600 mg at 04/25/18 2159  . LORazepam (ATIVAN) tablet 1 mg  1 mg Oral Once PRN Rankin, Shuvon B, NP      . LORazepam (ATIVAN)  tablet 1 mg  1 mg Oral Q6H PRN Oneta Rack, NP      . magnesium hydroxide (MILK OF MAGNESIA) suspension 30 mL  30 mL Oral Daily PRN Laveda Abbe, NP      . OLANZapine zydis (ZYPREXA) disintegrating tablet 10 mg  10 mg Oral Q8H PRN Oneta Rack, NP      . ondansetron (ZOFRAN) tablet 4 mg  4 mg Oral Q8H PRN Micheal Likens, MD      . simethicone (MYLICON) chewable tablet 160 mg  160 mg Oral Q6H PRN Nira Conn A, NP      . traZODone (DESYREL) tablet 100 mg  100 mg Oral QHS PRN,MR X 1 Davida Falconi, Burlene Arnt, MD        Lab Results: No results found for this or  any previous visit (from the past 48 hour(s)).  Blood Alcohol level:  Lab Results  Component Value Date   ETH <10 04/20/2018    Metabolic Disorder Labs: No results found for: HGBA1C, MPG No results found for: PROLACTIN No results found for: CHOL, TRIG, HDL, CHOLHDL, VLDL, LDLCALC  Physical Findings: AIMS: Facial and Oral Movements Muscles of Facial Expression: None, normal Lips and Perioral Area: None, normal Jaw: None, normal Tongue: None, normal,Extremity Movements Upper (arms, wrists, hands, fingers): None, normal Lower (legs, knees, ankles, toes): None, normal, Trunk Movements Neck, shoulders, hips: None, normal, Overall Severity Severity of abnormal movements (highest score from questions above): None, normal Incapacitation due to abnormal movements: None, normal Patient's awareness of abnormal movements (rate only patient's report): No Awareness, Dental Status Current problems with teeth and/or dentures?: No Does patient usually wear dentures?: No  CIWA:  CIWA-Ar Total: 3 COWS:  COWS Total Score: 4  Musculoskeletal: Strength & Muscle Tone: within normal limits Gait & Station: normal Patient leans: N/A  Psychiatric Specialty Exam: Physical Exam  Nursing note and vitals reviewed.   Review of Systems  Constitutional: Negative for chills and fever.  Respiratory: Negative for cough and  shortness of breath.   Cardiovascular: Negative for chest pain.  Gastrointestinal: Negative for abdominal pain, heartburn, nausea and vomiting.  Psychiatric/Behavioral: Negative for depression, hallucinations and suicidal ideas. The patient is not nervous/anxious and does not have insomnia.     Blood pressure (!) 151/99, pulse 98, temperature (!) 97.4 F (36.3 C), temperature source Oral, resp. rate 16, height 5\' 8"  (1.727 m), weight 68 kg (150 lb).Body mass index is 22.81 kg/m.  General Appearance: Casual and Fairly Groomed  Eye Contact:  Good  Speech:  Clear and Coherent and Normal Rate  Volume:  Normal  Mood:  Euthymic  Affect:  Congruent and Constricted  Thought Process:  Coherent and Goal Directed  Orientation:  Full (Time, Place, and Person)  Thought Content:  Logical  Suicidal Thoughts:  No  Homicidal Thoughts:  No  Memory:  Immediate;   Fair Recent;   Fair Remote;   Fair  Judgement:  Poor  Insight:  Lacking  Psychomotor Activity:  Normal  Concentration:  Concentration: Fair  Recall:  [employer] of Knowledge:  Fair  Language:  Fair  Akathisia:  No  Handed:    AIMS (if indicated):     Assets:  Communication Skills Resilience Social Support  ADL's:  Intact  Cognition:  WNL  Sleep:  Number of Hours: 6   Treatment Plan Summary: Daily contact with patient to assess and evaluate symptoms and progress in treatment and Medication management   -Continue inpatient hospitalization  -Unspecified psychotic disorder             -Continue abilify 15mg  po qDay  -Anxiety                        -Continue gabapentin 300mg  po TID             -Continue vistaril 50mg  po q6h prn anxiety  -Agitation             -Continue zydis 10mg  po q8h prn agitation             -Continue Ativan 1mg  po q6h prn agitation  -Insomnia              -Continue trazodone 100mg  po qhs PRN isomnia (may repeat x1)  -Encourage participation in groups and therapeutic milieu  -  Disposition  planning will be ongoing  Micheal Likens, MD 04/26/2018, 1:05 PM

## 2018-04-26 NOTE — [facility] Group Notes (Signed)
LCSW Group Therapy Note   04/26/2018 1:15pm   Type of Therapy and Topic:  Group Therapy:  Positive Affirmations   Participation Level:  Did Not Attend  Description of Group: This group addressed positive affirmation toward self and others. Patients went around the room and identified two positive things about themselves and two positive things about a peer in the room. Patients reflected on how it felt to share something positive with others, to identify positive things about themselves, and to hear positive things from others. Patients were encouraged to have a daily reflection of positive characteristics or circumstances.  Therapeutic Goals 1. Patient will verbalize two of their positive qualities 2. Patient will demonstrate empathy for others by stating two positive qualities about a peer in the group 3. Patient will verbalize their feelings when voicing positive self affirmations and when voicing positive affirmations of others 4. Patients will discuss the potential positive impact on their wellness/recovery of focusing on positive traits of self and others. Summary of Patient Progress:    Therapeutic Modalities Cognitive Behavioral Therapy Motivational Interviewing  Ida RogueRodney B Macarthur [place], [place] 04/26/2018 2:33 PM

## 2018-04-26 NOTE — Progress Notes (Signed)
Patient has been isolative to room this shift.  Patient took his abilify but refused other morning medications. Patient continues to isolate to his room and remains guarded and forwards little.  Patient denies SI, HI and AVH this shift. Patient has had no incidents of behavioral dyscontrol.   Assess patient for safety, offer medications as prescribed, engage patient in 1:1 staff talks.   Patient able to contract for safety.  Continue to monitor as planned.

## 2018-04-26 NOTE — Progress Notes (Signed)
Did not attend group 

## 2018-04-27 MED ORDER — PALIPERIDONE ER 6 MG PO TB24
6.0000 mg | ORAL_TABLET | Freq: Every day | ORAL | Status: DC
  Administered 2018-04-27 – 2018-04-28 (×2): 6 mg via ORAL
  Filled 2018-04-27 (×7): qty 1

## 2018-04-27 NOTE — Progress Notes (Signed)
Psychoeducational Group Note  Date:  04/27/2018 Time:  2035  Group Topic/Focus:  Wrap-Up Group:   The focus of this group is to help patients review their daily goal of treatment and discuss progress on daily workbooks.  Participation Level: Did Not Attend  Participation Quality:  Not Applicable  Affect:  Not Applicable  Cognitive:  Not Applicable  Insight:  Not Applicable  Engagement in Group: Not Applicable  Additional Comments:  The patient did not attend group this evening.   Hazle CocaGOODMAN, Jaivon S 04/27/2018, 8:35 PM

## 2018-04-27 NOTE — Progress Notes (Signed)
[facility] MD Progress Note  04/27/2018 12:55 PM Maurice [name]  MRN:  [id number] Subjective:    Maurice [name] is a 32 y/o M with history of treatment for depression, ADHD, and polysubstance abuse who was admitted from [facility] on IVC initiated by his sister due to worsening symptoms of psychosis and depression including reporting that he has been telepathically communicating with Tesla and Beverly Sessions as well reporting that they have been telling him to kill himself. Pt also reportedly asked his family for a gun. Pt has recent history of concussion about 2 weeks ago and he has continued to have vertigo from that incident (Head CT was negative for acute intracranial abnormality). Pt was medically stabilized and then transferred to [facility] for additional treatment and stabilization.He was started on trial of abilify and gabapentin, and he initially was refusing those medication. He barricaded himself in his room on 6/15, and he required IM medications to manage his agitation. Second opinion was obtained for forced medications; however, pt has been taking abilify (though he has been refusing gabapentin).   Today upon interview, pt describes that he is struggling with anxiety and internal preoccupation. When asked to provide more details, pt shares, "There's something called the trickster that controls me and makes me say things. He repeats things over and over in my head." When asked to provide an example, pt describes the thoughts as derogatory and ego dystonic, and he provides example, "He says that me and my wife don't know each other." He reports that he was struggling with this problem prior to admission, but it has worsened now and it rates it an intensity of "10/10" today. He denies SI/HI/VH. Pt reports that he is sleeping adequately. His appetite is fair. He denies other physical complaints. He is tolerating his medications without difficulty, but he continues to refuse gabapentin. Discussed with patient about  treatment options, and he is in agreement to discontinue gabapentin as he has not been taking it. Discussed with patient about changing abilify to a different medication to address AH and paranoia, and he was in agreement to trial of Invega. Discussed with patient that likely recommendation if he has good tolerability and efficacy will be to transition to long-acting injectable form, and pt verbalized understanding. He had no further questions, comments, or concerns.  Principal Problem: Acute psychosis (HCC) Diagnosis:   Patient Active Problem List   Diagnosis Date Noted  . Acute psychosis (HCC) [F23] 04/21/2018  . Suicidal ideations [R45.851]   . Polysubstance (including opioids) dependence with physiological dependence (HCC) [F19.20] 04/02/2018  . 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 Delusions (HCC) [F22]    Total Time spent with patient: 30 minutes  Past Psychiatric History: see H&P  Past Medical History:  Past Medical History:  Diagnosis Date  . Anxiety   . Substance abuse (HCC)    History reviewed. No pertinent surgical history. Family History: History reviewed. No pertinent family history. Family Psychiatric  History: see H&P Social History:  Social History   Substance and Sexual Activity  Alcohol Use Not Currently  . Alcohol/week: 0.0 oz     Social History   Substance and Sexual Activity  Drug Use No    Social History   Socioeconomic History  . Marital status: Single    Spouse name: Not on file  . Number of children: Not on file  . Years of education: Not on file  . Highest education level: Not on file  Occupational History  . Not on  file  Social Needs  . Financial resource strain: Not on file  . Food insecurity:    Worry: Not on file    Inability: Not on file  . Transportation needs:    Medical: Not on file    Non-medical: Not on file  Tobacco Use  . Smoking status: Former Games developermoker  . Smokeless tobacco: Never Used  Substance  and Sexual Activity  . Alcohol use: Not Currently    Alcohol/week: 0.0 oz  . Drug use: No  . Sexual activity: Not on file  Lifestyle  . Physical activity:    Days per week: Not on file    Minutes per session: Not on file  . Stress: Not on file  Relationships  . Social connections:    Talks on phone: Not on file    Gets together: Not on file    Attends religious service: Not on file    Active member of club or organization: Not on file    Attends meetings of clubs or organizations: Not on file    Relationship status: Not on file  Other Topics Concern  . Not on file  Social History Narrative  . Not on file   Additional Social History:                         Sleep: Good  Appetite:  Fair  Current Medications: Current Facility-Administered Medications  Medication Dose Route Frequency Provider Last Rate Last Dose  . acetaminophen (TYLENOL) tablet 650 mg  650 mg Oral Q6H PRN Laveda AbbeParks, Laurie Britton, NP      . alum & mag hydroxide-simeth (MAALOX/MYLANTA) 200-200-20 MG/5ML suspension 30 mL  30 mL Oral Q4H PRN Laveda AbbeParks, Laurie Britton, NP      . hydrOXYzine (ATARAX/VISTARIL) tablet 50 mg  50 mg Oral Q6H PRN Micheal Likensainville, Jakim Drapeau T, MD   50 mg at 04/25/18 2159  . ibuprofen (ADVIL,MOTRIN) tablet 600 mg  600 mg Oral Q6H PRN Micheal Likensainville, Amiria Orrison T, MD   600 mg at 04/25/18 2159  . LORazepam (ATIVAN) tablet 1 mg  1 mg Oral Once PRN Rankin, Shuvon B, NP      . LORazepam (ATIVAN) tablet 1 mg  1 mg Oral Q6H PRN Oneta RackLewis, Tanika N, NP      . magnesium hydroxide (MILK OF MAGNESIA) suspension 30 mL  30 mL Oral Daily PRN Laveda AbbeParks, Laurie Britton, NP      . OLANZapine zydis (ZYPREXA) disintegrating tablet 10 mg  10 mg Oral Q8H PRN Oneta RackLewis, Tanika N, NP      . ondansetron (ZOFRAN) tablet 4 mg  4 mg Oral Q8H PRN Micheal Likensain[place], Brennon Otterness T, MD      . paliperidone (INVEGA) 24 hr tablet 6 mg  6 mg Oral Daily Micheal Likens[place], Viveca Beckstrom T, MD      . simethicone (MYLICON) chewable tablet 160 mg  160 mg Oral  Q6H PRN Nira ConnBerry, Jason A, NP      . traZODone (DESYREL) tablet 100 mg  100 mg Oral QHS PRN,MR X 1 Desaree Downen T, MD        Lab Results: No results found for this or any previous visit (from the past 48 hour(s)).  Blood Alcohol level:  Lab Results  Component Value Date   ETH <10 04/20/2018    Metabolic Disorder Labs: No results found for: HGBA1C, MPG No results found for: PROLACTIN No results found for: CHOL, TRIG, HDL, CHOLHDL, VLDL, LDLCALC  Physical Findings: AIMS: Facial and Oral Movements Muscles  of Facial Expression: None, normal Lips and Perioral Area: None, normal Jaw: None, normal Tongue: None, normal,Extremity Movements Upper (arms, wrists, hands, fingers): None, normal Lower (legs, knees, ankles, toes): None, normal, Trunk Movements Neck, shoulders, hips: None, normal, Overall Severity Severity of abnormal movements (highest score from questions above): None, normal Incapacitation due to abnormal movements: None, normal Patient's awareness of abnormal movements (rate only patient's report): No Awareness, Dental Status Current problems with teeth and/or dentures?: No Does patient usually wear dentures?: No  CIWA:  CIWA-Ar Total: 3 COWS:  COWS Total Score: 4  Musculoskeletal: Strength & Muscle Tone: within normal limits Gait & Station: normal Patient leans: N/A  Psychiatric Specialty Exam: Physical Exam  Nursing note and vitals reviewed.   Review of Systems  Constitutional: Negative for chills and fever.  Respiratory: Negative for cough and shortness of breath.   Cardiovascular: Negative for chest pain.  Gastrointestinal: Negative for abdominal pain, heartburn, nausea and vomiting.  Psychiatric/Behavioral: Positive for hallucinations. Negative for depression and suicidal ideas. The patient is nervous/anxious. The patient does not have insomnia.     Blood pressure 139/88, pulse 88, temperature 97.6 F (36.4 C), temperature source Oral, resp. rate 16,  height 5\' 8"  (1.727 m), weight 68 kg (150 lb).Body mass index is 22.81 kg/m.  General Appearance: Casual and Fairly Groomed  Eye Contact:  Good  Speech:  Clear and Coherent and Normal Rate  Volume:  Normal  Mood:  Anxious and Depressed  Affect:  Congruent and Flat  Thought Process:  Coherent and Goal Directed  Orientation:  Full (Time, Place, and Person)  Thought Content:  Hallucinations: Auditory  Suicidal Thoughts:  No  Homicidal Thoughts:  No  Memory:  Immediate;   Fair Recent;   Fair Remote;   Fair  Judgement:  Fair  Insight:  Lacking  Psychomotor Activity:  Normal  Concentration:  Concentration: Fair  Recall:  [employer] of Knowledge:  Fair  Language:  Fair  Akathisia:  No  Handed:    AIMS (if indicated):     Assets:  Desire for Improvement Resilience  ADL's:  Intact  Cognition:  WNL  Sleep:  Number of Hours: 6.75   Treatment Plan Summary: Daily contact with patient to assess and evaluate symptoms and progress in treatment and Medication management   -Continue inpatient hospitalization  -Unspecified psychotic disorder -Discontinue abilify 15mg  po qDay   -Start Invega 6mg  po qDay  -Anxiety  -Discontinue gabapentin 300mg  po TID -Continue vistaril 50mg  po q6h prn anxiety  -Agitation -Continue zydis 10mg  po q8h prn agitation -Continue Ativan 1mg  po q6h prn agitation  -Insomnia  -Continue trazodone 100mg  po qhs PRN isomnia (may repeat x1)  -Encourage participation in groups and therapeutic milieu  -Disposition planning will be ongoing  Micheal Likens, MD 04/27/2018, 12:55 PM

## 2018-04-27 NOTE — Progress Notes (Signed)
Recreation Therapy Notes  Date: 6.19.19 Time: 1000 Location: [address] Dayroom  Group Topic: Leisure Education, Goal Setting  Goal Area(s) Addresses:  Patient will be able to identify at least 3 life goals.  Patient will be able to identify benefit of investing in life goals.  Patient will be able to identify benefit of setting life goals.   Intervention: Worksheet  Activity: Setting Life Goals.  Patients were to identify what they were doing well, what they needed to improve and set [name] in the areas of family, friends, spirituality, work/school, mental health and body.  Patients would then share their top 4 categories with the group.  Education: Discharge Planning, PharmacologistCoping Skills, Leisure Education  Education Outcome: Acknowledges Education/In Group Clarification Provided/Needs Additional Education  Clinical Observations: Pt did not attend group.     Maurice RancherMarjette Lemarcus [name], Maurice [name]         Lillia AbedLindsay, Maurice [name] 04/27/2018 1:07 PM

## 2018-04-27 NOTE — Plan of Care (Signed)
  Problem: Education: Goal: Verbalization of understanding the information provided will improve Outcome: Progressing   Problem: Coping: Goal: Ability to verbalize frustrations and anger appropriately will improve Outcome: Progressing   Problem: Activity: Goal: Sleeping patterns will improve Outcome: Progressing   Problem: Activity: Goal: Interest or engagement in activities will improve Outcome: Progressing

## 2018-04-27 NOTE — [facility] Group Notes (Signed)
LCSW Group Therapy Note  04/27/2018 1:15pm  Type of Therapy/Topic:  Group Therapy:  Balance in Life  Participation Level:  Did Not Attend  Description of Group:    This group will address the concept of balance and how it feels and looks when one is unbalanced. Patients will be encouraged to process areas in their lives that are out of balance and identify reasons for remaining unbalanced. Facilitators will guide patients in utilizing problem-solving interventions to address and correct the stressor making their life unbalanced. Understanding and applying boundaries will be explored and addressed for obtaining and maintaining a balanced life. Patients will be encouraged to explore ways to assertively make their unbalanced needs known to significant others in their lives, using other group members and facilitator for support and feedback.  Therapeutic Goals: 1. Patient will identify two or more emotions or situations they have that consume much of in their lives. 2. Patient will identify signs/triggers that life has become out of balance:  3. Patient will identify two ways to set boundaries in order to achieve balance in their lives:  4. Patient will demonstrate ability to communicate their needs through discussion and/or role plays  Summary of Patient Progress:      Therapeutic Modalities:   Cognitive Behavioral Therapy Solution-Focused Therapy Assertiveness Training  Maurice [name], [place] 04/27/2018 3:23 PM

## 2018-04-27 NOTE — Progress Notes (Signed)
Nursing Progress Note: 7p-7a D: Pt currently presents with a ambivalent/paranoid/delusional affect and behavior. Pt states "I am worried about taking invega. Tesla doesn't want me to." Interacting appropriately with the milieu. Pt reports fair sleep during the previous night with current medication regimen. Pt did not attend wrap-up group.  A: Pt provided with medications per providers orders. Pt's labs and vitals were monitored throughout the night. Pt supported emotionally and encouraged to express concerns and questions. Pt educated on medications.  R: Pt's safety ensured with 15 minute and environmental checks. Pt currently denies SI, HI, and AVH. Pt verbally contracts to seek staff if SI,HI, or AVH occurs and to consult with staff before acting on any harmful thoughts. Will continue to monitor.

## 2018-04-27 NOTE — Tx Team (Signed)
Interdisciplinary Treatment and Diagnostic Plan Update  04/27/2018 Time of Session: 8:13 AM  Maurice [name] MRN: [id number]  Principal Diagnosis: Acute psychosis [facility])  Secondary Diagnoses: Principal Problem:   Acute psychosis ([place])   Current Medications:  Current Facility-Administered Medications  Medication Dose Route Frequency Provider Last Rate Last Dose  . acetaminophen (TYLENOL) tablet 650 mg  650 mg Oral Q6H PRN Ethelene Hal, NP      . alum & mag hydroxide-simeth (MAALOX/MYLANTA) 200-200-20 MG/5ML suspension 30 mL  30 mL Oral Q4H PRN Ethelene Hal, NP      . ARIPiprazole (ABILIFY) tablet 15 mg  15 mg Oral Daily Pennelope Bracken, MD   15 mg at 04/27/18 0749  . gabapentin (NEURONTIN) capsule 300 mg  300 mg Oral TID Pennelope Bracken, MD   300 mg at 04/26/18 0942  . hydrOXYzine (ATARAX/VISTARIL) tablet 50 mg  50 mg Oral Q6H PRN Pennelope Bracken, MD   50 mg at 04/25/18 2159  . ibuprofen (ADVIL,MOTRIN) tablet 600 mg  600 mg Oral Q6H PRN Pennelope Bracken, MD   600 mg at 04/25/18 2159  . LORazepam (ATIVAN) tablet 1 mg  1 mg Oral Once PRN Rankin, Shuvon B, NP      . LORazepam (ATIVAN) tablet 1 mg  1 mg Oral Q6H PRN Derrill Center, NP      . magnesium hydroxide (MILK OF MAGNESIA) suspension 30 mL  30 mL Oral Daily PRN Ethelene Hal, NP      . OLANZapine zydis (ZYPREXA) disintegrating tablet 10 mg  10 mg Oral Q8H PRN Derrill Center, NP      . ondansetron (ZOFRAN) tablet 4 mg  4 mg Oral Q8H PRN Pennelope Bracken, MD      . simethicone (MYLICON) chewable tablet 160 mg  160 mg Oral Q6H PRN Lindon Romp A, NP      . traZODone (DESYREL) tablet 100 mg  100 mg Oral QHS PRN,MR X 1 [place], Randa Ngo, MD        PTA Medications: Medications Prior to Admission  Medication Sig Dispense Refill Last Dose  . ALPRAZolam (XANAX) 1 MG tablet Take 1 mg by mouth daily as needed.  4 04/20/2018 at Unknown time  . amitriptyline (ELAVIL)  25 MG tablet Take 1 tablet (25 mg total) by mouth at bedtime. (Patient not taking: Reported on 04/20/2018) 10 tablet 0 Not Taking at Unknown time  . amphetamine-dextroamphetamine (ADDERALL) 20 MG tablet Take 20 mg by mouth daily.  0 Past Week at Unknown time  . Buprenorphine HCl-Naloxone HCl 8-2 MG FILM Place 1 tablet under the tongue daily.    04/01/2018 at Unknown time  . ibuprofen (ADVIL,MOTRIN) 800 MG tablet Take 1 tablet (800 mg total) by mouth 3 (three) times daily. 21 tablet 0 Past Week at Unknown time  . Ibuprofen-diphenhydrAMINE Cit (ADVIL PM) 200-38 MG TABS Take 2 tablets by mouth at bedtime as needed (pain).   Past Week at Unknown time  . meclizine (ANTIVERT) 25 MG tablet Take 1 tablet (25 mg total) by mouth 3 (three) times daily as needed for dizziness. 30 tablet 0 Past Week at Unknown time  . mometasone (NASONEX) 50 MCG/ACT nasal spray Place 2 sprays into the nose daily. (Patient not taking: Reported on 04/20/2018) 17 g 1 Not Taking at Unknown time  . ondansetron (ZOFRAN ODT) 4 MG disintegrating tablet Take 1 tablet (4 mg total) by mouth every 8 (eight) hours as needed for nausea. (Patient not  taking: Reported on 04/20/2018) 10 tablet 0 Not Taking at Unknown time  . promethazine (PHENERGAN) 25 MG tablet Take 1 tablet (25 mg total) by mouth every 8 (eight) hours as needed for nausea or vomiting. 20 tablet 0 Past Week at Unknown time    Patient Stressors: Medication change or noncompliance  Patient Strengths: Communication skills  Treatment Modalities: Medication Management, Group therapy, Case management,  1 to 1 session with clinician, Psychoeducation, Recreational therapy.   Physician Treatment Plan for Primary Diagnosis: Acute psychosis ([place]) Long Term Goal(s): Improvement in symptoms so as ready for discharge  Short Term Goals: Ability to identify and develop effective coping behaviors will improve Ability to identify changes in lifestyle to reduce recurrence of condition will  improve  Medication Management: Evaluate patient's response, side effects, and tolerance of medication regimen.  Therapeutic Interventions: 1 to 1 sessions, Unit Group sessions and Medication administration.  Evaluation of Outcomes: Progressing   6/19: "There's something called the trickster that controls me and makes me say things. He repeats things over and over in my head." When asked to provide an example, pt describes the thoughts as derogatory and ego dystonic, and he provides example, "He says that me and my wife don't know each other." Discussed with patient about changing abilify to a different medication to address AH and paranoia, and he was in agreement to trial of [place]. Discussed with patient that likely recommendation if he has good tolerability and efficacy will be to transition to long-acting injectable form, and pt verbalized understanding.    Physician Treatment Plan for Secondary Diagnosis: Principal Problem:   Acute psychosis ([place])   Long Term Goal(s): Improvement in symptoms so as ready for discharge  Short Term Goals: Ability to identify and develop effective coping behaviors will improve Ability to identify changes in lifestyle to reduce recurrence of condition will improve  Medication Management: Evaluate patient's response, side effects, and tolerance of medication regimen.  Therapeutic Interventions: 1 to 1 sessions, Unit Group sessions and Medication administration.  Evaluation of Outcomes: Progressing   RN Treatment Plan for Primary Diagnosis: Acute psychosis ([place]) Long Term Goal(s): Knowledge of disease and therapeutic regimen to maintain health will improve  Short Term Goals: Ability to identify and develop effective coping behaviors will improve and Compliance with prescribed medications will improve  Medication Management: RN will administer medications as ordered by provider, will assess and evaluate patient's response and provide education to  patient for prescribed medication. RN will report any adverse and/or side effects to prescribing provider.  Therapeutic Interventions: 1 on 1 counseling sessions, Psychoeducation, Medication administration, Evaluate responses to treatment, Monitor vital signs and CBGs as ordered, Perform/monitor CIWA, COWS, AIMS and Fall Risk screenings as ordered, Perform wound care treatments as ordered.  Evaluation of Outcomes: Progressing   LCSW Treatment Plan for Primary Diagnosis: Acute psychosis ([place]) Long Term Goal(s): Safe transition to appropriate next level of care at discharge, Engage patient in therapeutic group addressing interpersonal concerns.  Short Term Goals: Engage patient in aftercare planning with referrals and resources  Therapeutic Interventions: Assess for all discharge needs, 1 to 1 time with Social worker, Explore available resources and support systems, Assess for adequacy in community support network, Educate family and significant other(s) on suicide prevention, Complete Psychosocial Assessment, Interpersonal group therapy.  Evaluation of Outcomes: Met  Return home, follow up [facility]   Progress in Treatment: Attending groups: No Participating in groups: No Taking medication as prescribed: Yes Toleration medication: Yes, no side effects reported at  this time Family/Significant other contact made: Yes Patient understands diagnosis: No Limited insight Discussing patient identified problems/goals with staff: Yes Medical problems stabilized or resolved: Yes Denies suicidal/homicidal ideation: Yes Issues/concerns per patient self-inventory: None Other: N/A  New problem(s) identified: None identified at this time.   New Short Term/Long Term Goal(s): "I'm just here to relax."  Discharge Plan or Barriers:   Reason for Continuation of Hospitalization:  Delusions   Medication stabilization   Estimated Length of Stay: 6/24  Attendees: Patient:  04/27/2018  8:13 AM   Physician: Maris Berger, MD 04/27/2018  8:13 AM  Nursing: Megan Mans, RN 04/27/2018  8:13 AM  RN Care Manager: Lars Pinks, RN 04/27/2018  8:13 AM  Social Worker: Ripley Fraise 04/27/2018  8:13 AM  Recreational Therapist: Winfield Cunas 04/27/2018  8:13 AM  Other: [name] 04/27/2018  8:13 AM  Other:  04/27/2018  8:13 AM    Scribe for Treatment Team:  Roque Lias LCSW 04/27/2018 8:13 AM

## 2018-04-27 NOTE — Progress Notes (Signed)
Patient appears with flat/blunted affect. Patient is still refusing gabapentin, but took Abilify this morning during a.m. medication pass. Patient has not attended group today. Denies SI, HI, AVH.  Patient provided with medications per providers orders. Patient educated on medications. Patient safety maintained with 15 minute checks and environmental checks. Will continue to monitor.

## 2018-04-28 DIAGNOSIS — R42 Dizziness and giddiness: Secondary | ICD-10-CM

## 2018-04-28 MED ORDER — PALIPERIDONE PALMITATE ER 156 MG/ML IM SUSY
156.0000 mg | PREFILLED_SYRINGE | INTRAMUSCULAR | Status: DC
  Filled 2018-04-28: qty 1

## 2018-04-28 MED ORDER — PALIPERIDONE PALMITATE ER 234 MG/1.5ML IM SUSY
234.0000 mg | PREFILLED_SYRINGE | Freq: Once | INTRAMUSCULAR | Status: DC
  Filled 2018-04-28: qty 1.5

## 2018-04-28 NOTE — Progress Notes (Addendum)
Patient self inventory- Patient slept well last night, sleep medication was requested and it did help. Appetite has been good, energy level normal, concentration good. Depression and hopelessness rated 0 out of 10, and anxiety rated 7 out of 10. Denies SI/HI/AVH. Denies physical pain. Patient noncompliant with prescribed Invega injection, but compliant with p.o version. MD notified.  Safety maintained with 15 minute checks. Will continue to monitor.   Writer tried to convince patient to attend groups, but patient refused as per usual. Claims he doesn't want to go because he has Tourette's syndrome and doesn't want to hurt anyone's feelings. MD notified. Patient has not attended any groups today, but has attended meals in the cafeteria.

## 2018-04-28 NOTE — [facility] Group Notes (Signed)
[facility]  04/28/2018 , 1:20 PM    Type of Therapy:  [employer] Presentation  Participation Level:  Invited.  Chose to not attend.  Participation Quality:  Attentive  Affect:  Blunted  Cognitive:  Oriented  Insight:  Limited  Engagement in Therapy:  Engaged  Modes of Intervention:  Discussion, Education and Socialization  Summary of Progress/Problems:  Tammi  from [employer] came to present her recovery story, encourage group  members to share something about their story, and present information about the MHA.    Daryel Geraldorth, Mayre Bury B 04/28/2018 , 1:20 PM

## 2018-04-28 NOTE — Progress Notes (Signed)
Recreation Therapy Notes  Date: 6.20.19 Time: 1000 Location: 500 Hall Dayroom  Group Topic: Stress Management  Goal Area(s) Addresses:  Patient will verbalize importance of using healthy stress management.  Patient will identify positive emotions associated with healthy stress management.   Intervention: Stress Management  Activity :  LRT introduced the stress management technique of meditation to patients.  LRT spoke with patient about how meditation in conjunction with deep breathing can help to relieve stress and bring on a sense of calm.  LRT then played a meditation that allowed patients to focus on positive thoughts and let their breathing guide them.  Education:  Stress Management, Discharge Planning.   Education Outcome: Acknowledges edcuation/In group clarification offered/Needs additional education  Clinical Observations/Feedback: Pt did not attend group.    Carlyon Nolasco, LRT/CTRS         Maurice [name] A 04/28/2018 10:56 AM 

## 2018-04-28 NOTE — Plan of Care (Signed)
  Problem: Coping: Goal: Ability to verbalize frustrations and anger appropriately will improve Outcome: Progressing   Problem: Coping: Goal: Ability to demonstrate self-control will improve Outcome: Progressing   Problem: Physical Regulation: Goal: Ability to maintain clinical measurements within normal limits will improve Outcome: Progressing   Problem: Safety: Goal: Periods of time without injury will increase Outcome: Progressing   

## 2018-04-28 NOTE — Progress Notes (Signed)
Patient just approached the nurses station and said "someone just came by my room and told me I need to get my meds."  Writer and tech told him no one came to his room. Patient redirected and went back to bed.

## 2018-04-28 NOTE — Progress Notes (Signed)
Nursing Progress Note: 7p-7a D: Pt currently presents with a flat/depressed/confused affect and behavior. Pt states "I was called up to the nurses station by the orderly telepathically. I am scared of the invega injection. What if I get really sick?" Not interacting with the milieu. Pt reports poor sleep during the previous night with current medication regimen.   A: Pt provided with medications per providers orders. Pt's labs and vitals were monitored throughout the night. Pt supported emotionally and encouraged to express concerns and questions. Pt educated on medications.  R: Pt's safety ensured with 15 minute and environmental checks. Pt currently denies SI, HI, and AVH. Pt verbally contracts to seek staff if SI,HI, or AVH occurs and to consult with staff before acting on any harmful thoughts. Will continue to monitor.

## 2018-04-28 NOTE — Progress Notes (Signed)
[facility] MD Progress Note  04/28/2018 2:49 PM Maurice [name]  MRN:  [id number] Subjective:    Maurice [name] is a 32 y/o M with history of treatment for depression, ADHD, and polysubstance abuse who was admitted from [facility] on IVC initiated by his sister due to worsening symptoms of psychosis and depression including reporting that he has been telepathically communicating with Tesla and Beverly Sessions as well reporting that they have been telling him to kill himself. Pt also reportedly asked his family for a gun. Pt has recent history of concussion about 2 weeks ago and he has continued to have vertigo from that incident (Head CT was negative for acute intracranial abnormality). Pt was medically stabilized and then transferred to [facility] for additional treatment and stabilization.He was started on trial of abilify and gabapentin, and he initially was refusing those medication. He barricaded himself in his room on 6/15, and he required IM medications to manage his agitation. Second opinion was obtained for forced medications; however, pt wastaking abilify. Pt reported ongoing symptoms of AH, and he requested change of medications, and he was started on trial of Invega with potential plan to transition to long-acting injectable form.  Today upon interview, pt shares, "I'm good. I got some good sleep last night." He reports that overall he is feeling better. His main concern today is some ongoing anxiety which he rates "7/10" in intensity. He reports some ongoing mild vertigo symptoms when he rises quickly, but he otherwise denies other physical symptoms. He denies SI/HI/AH/VH. He feels that he is tolerating his medications well, and he is in agreement to continue his current oral medication of Invega oral form. Discussed with patient about option of transition to long-acting injectable form of [place], and he was in agreement. Pt had no further questions, comments, or concerns.  Later in the afternoon, pt  refused Gean Birchwood from [employer] staff, and he stated he would like to talk with his family first.   Principal Problem: Bipolar I disorder, current or most recent episode manic, with psychotic features (HCC) Diagnosis:   Patient Active Problem List   Diagnosis Date Noted  . Bipolar I disorder, current or most recent episode manic, with psychotic features (HCC) [F31.2] 04/21/2018  . Suicidal ideations [R45.851]   . Polysubstance (including opioids) dependence with physiological dependence (HCC) [F19.20] 04/02/2018  . 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 Delusions (HCC) [F22]    Total Time spent with patient: 30 minutes  Past Psychiatric History: see H&P  Past Medical History:  Past Medical History:  Diagnosis Date  . Anxiety   . Substance abuse (HCC)    History reviewed. No pertinent surgical history. Family History: History reviewed. No pertinent family history. Family Psychiatric  History: see H&P Social History:  Social History   Substance and Sexual Activity  Alcohol Use Not Currently  . Alcohol/week: 0.0 oz     Social History   Substance and Sexual Activity  Drug Use No    Social History   Socioeconomic History  . Marital status: Single    Spouse name: Not on file  . Number of children: Not on file  . Years of education: Not on file  . Highest education level: Not on file  Occupational History  . Not on file  Social Needs  . Financial resource strain: Not on file  . Food insecurity:    Worry: Not on file    Inability: Not on file  . Transportation needs:  Medical: Not on file    Non-medical: Not on file  Tobacco Use  . Smoking status: Former Games developer  . Smokeless tobacco: Never Used  Substance and Sexual Activity  . Alcohol use: Not Currently    Alcohol/week: 0.0 oz  . Drug use: No  . Sexual activity: Not on file  Lifestyle  . Physical activity:    Days per week: Not on file    Minutes per session: Not on file  . Stress:  Not on file  Relationships  . Social connections:    Talks on phone: Not on file    Gets together: Not on file    Attends religious service: Not on file    Active member of club or organization: Not on file    Attends meetings of clubs or organizations: Not on file    Relationship status: Not on file  Other Topics Concern  . Not on file  Social History Narrative  . Not on file   Additional Social History:                         Sleep: Good  Appetite:  Good  Current Medications: Current Facility-Administered Medications  Medication Dose Route Frequency Provider Last Rate Last Dose  . acetaminophen (TYLENOL) tablet 650 mg  650 mg Oral Q6H PRN Laveda Abbe, NP      . alum & mag hydroxide-simeth (MAALOX/MYLANTA) 200-200-20 MG/5ML suspension 30 mL  30 mL Oral Q4H PRN Laveda Abbe, NP      . hydrOXYzine (ATARAX/VISTARIL) tablet 50 mg  50 mg Oral Q6H PRN Micheal Likens, MD   50 mg at 04/25/18 2159  . ibuprofen (ADVIL,MOTRIN) tablet 600 mg  600 mg Oral Q6H PRN Micheal Likens, MD   600 mg at 04/25/18 2159  . LORazepam (ATIVAN) tablet 1 mg  1 mg Oral Q6H PRN Oneta Rack, NP      . magnesium hydroxide (MILK OF MAGNESIA) suspension 30 mL  30 mL Oral Daily PRN Laveda Abbe, NP      . OLANZapine zydis (ZYPREXA) disintegrating tablet 10 mg  10 mg Oral Q8H PRN Oneta Rack, NP      . ondansetron (ZOFRAN) tablet 4 mg  4 mg Oral Q8H PRN Micheal Likens, MD      . paliperidone (INVEGA SUSTENNA) injection 234 mg  234 mg Intramuscular Once Micheal Likens, MD       Followed by  . [START ON 05/02/2018] paliperidone (INVEGA SUSTENNA) injection 156 mg  156 mg Intramuscular Q28 days Jolyne Loa T, MD      . paliperidone (INVEGA) 24 hr tablet 6 mg  6 mg Oral Daily Micheal Likens, MD   6 mg at 04/28/18 0827  . simethicone (MYLICON) chewable tablet 160 mg  160 mg Oral Q6H PRN Nira Conn A, NP      .  traZODone (DESYREL) tablet 100 mg  100 mg Oral QHS PRN,MR X 1 Antonino Nienhuis T, MD        Lab Results: No results found for this or any previous visit (from the past 48 hour(s)).  Blood Alcohol level:  Lab Results  Component Value Date   ETH <10 04/20/2018    Metabolic Disorder Labs: No results found for: HGBA1C, MPG No results found for: PROLACTIN No results found for: CHOL, TRIG, HDL, CHOLHDL, VLDL, LDLCALC  Physical Findings: AIMS: Facial and Oral Movements Muscles of Facial Expression: None, normal  Lips and Perioral Area: None, normal Jaw: None, normal Tongue: None, normal,Extremity Movements Upper (arms, wrists, hands, fingers): None, normal Lower (legs, knees, ankles, toes): None, normal, Trunk Movements Neck, shoulders, hips: None, normal, Overall Severity Severity of abnormal movements (highest score from questions above): None, normal Incapacitation due to abnormal movements: None, normal Patient's awareness of abnormal movements (rate only patient's report): No Awareness, Dental Status Current problems with teeth and/or dentures?: No Does patient usually wear dentures?: No  CIWA:  CIWA-Ar Total: 3 COWS:  COWS Total Score: 4  Musculoskeletal: Strength & Muscle Tone: within normal limits Gait & Station: normal Patient leans: N/A  Psychiatric Specialty Exam: Physical Exam  Nursing note and vitals reviewed.   Review of Systems  Constitutional: Negative for chills and fever.  Respiratory: Negative for cough and shortness of breath.   Cardiovascular: Negative for chest pain.  Gastrointestinal: Negative for abdominal pain, heartburn, nausea and vomiting.  Psychiatric/Behavioral: Negative for depression, hallucinations and suicidal ideas. The patient is not nervous/anxious and does not have insomnia.     Blood pressure 136/89, pulse (!) 140, temperature 99 F (37.2 C), temperature source Oral, resp. rate 20, height 5\' 8"  (1.727 m), weight 68 kg (150  lb).Body mass index is 22.81 kg/m.  General Appearance: Casual and Fairly Groomed  Eye Contact:  Good  Speech:  Clear and Coherent and Normal Rate  Volume:  Normal  Mood:  Euthymic  Affect:  Appropriate and Congruent  Thought Process:  Coherent and Goal Directed  Orientation:  Full (Time, Place, and Person)  Thought Content:  Logical  Suicidal Thoughts:  No  Homicidal Thoughts:  No  Memory:  Immediate;   Fair Recent;   Fair Remote;   Fair  Judgement:  Poor  Insight:  Lacking  Psychomotor Activity:  Normal  Concentration:  Concentration: Fair  Recall:  [employer]Fair  Fund of Knowledge:  Fair  Language:  Fair  Akathisia:  No  Handed:    AIMS (if indicated):     Assets:  Resilience  ADL's:  Intact  Cognition:  WNL  Sleep:  Number of Hours: 5   Treatment Plan Summary: Daily contact with patient to assess and evaluate symptoms and progress in treatment and Medication management   -Continue inpatient hospitalization  -Unspecified psychotic disorder  -Continue Invega 6mg  po qDay   -Start [place]vega Sust[place]. Administer Hinda GlatterInvega Sustenna 234mg  IM once today 04/28/18, and plan for Gean Birchwoodnvega Sustenna 156mg  [id number] starting on 05/02/18  -Anxiety   -Continue vistaril 50mg  po q6h prn anxiety  -Agitation -Continue zydis 10mg  po q8h prn agitation -Continue Ativan 1mg  po q6h prn agitation  -Insomnia  -Continuetrazodone 100mg  po qhs PRN isomnia (may repeat x1)  -Encourage participation in groups and therapeutic milieu  -Disposition planning will be ongoing    Micheal Likenshristopher T Shanteria Laye, MD 04/28/2018, 2:49 PM

## 2018-04-29 NOTE — Progress Notes (Signed)
Pt was observed in his room, seen awake in bed. Pt was anxious/flat/depressed in affect and mood. Pt denies SI/HI/AVH/Pain at this time. Pt refused scheduled Invega 6 this evening. Pt states "It's makes me feel very drowsy". Pt was encourage to speak with provider tomorrow. Pt c/o of increasing anxiety this evening. Pt was encourage to turn to alternative methods for coping. Pt states music is effective but he doesn't have his phone. PRN ativan and trazodone requested and given. Will continue with POC.

## 2018-04-29 NOTE — Progress Notes (Signed)
Patient presents with flat and depressed affect. Patient was sitting on the edge of the bed, staring out the window. Writer asked the patient if he was ready to take his medication, patient said "I think I'm going to hold off on it right now. It makes me really sleepy. I'm really sleepy right now, I think I slept too much." Patient is able to answer questions, but does not initiate or carry on conversations. Denies SI HI AVH. Continues to want to take Invega p.o. at night. Patient was complaining of tremors as well.  Refuses to go to breakfast, but gets up for lunch and dinner.  Patient compliant with medication when taken at night. Safety maintained with 15 minute checks and environmental checks. Will continue to monitor.

## 2018-04-29 NOTE — Progress Notes (Signed)
Patient's parents in to visit. Stopped by nurses' station to inquire about how patient's day was. Consent on chart to speak with mother who was updated by this Clinical research associatewriter and patient's primary nurse. Mother's main concern is that patient's outpatient provider, Dr. Evelene CroonKaur, has written for patient to have adderall and xanax. Patient has also been on suboxone while under her care. Mother would like for patient to switch outpatient providers if at all possible upon discharge. Encouraged mother to speak with Rod about possibility of this. Mother also states if patient cannot be switched, she is willing to look at transferring to another provider. Mother distressed and concerned as she feels this medications have contributed to patient's decompensation. Emotional support and active listening provided.

## 2018-04-29 NOTE — Progress Notes (Addendum)
[facility] MD Progress Note  04/29/2018 11:16 AM Maurice [name]  MRN:  [id number]  Subjective: Maurice [name] reports, "I'm feeling a little drowsy this morning. I had a visit with my parents & sister yesterday evening. It was a good visit. I'm still refusing to take the Invega monthly injections. I'm afraid of everything about this injection at this time. I will rather prefer the pills as I'm doing now. I'm having some tremors at this point while on the pill form. I'm afraid of of the side effects associated with the long acting form of this Invega. I don't think that I will be attending any group sessions here due to the tremors I'm having. I will rate my mood at #6 today".  Maurice [name] is a 32 y/o M with history of treatment for depression, ADHD, and polysubstance abuse who was admitted from [facility] on IVC initiated by his sister due to worsening symptoms of psychosis and depression including reporting that he has been telepathically communicating with Tesla and Beverly Sessions as well reporting that they have been telling him to kill himself. Pt also reportedly asked his family for a gun. Pt has recent history of concussion about 2 weeks ago and he has continued to have vertigo from that incident (Head CT was negative for acute intracranial abnormality). Pt was medically stabilized and then transferred to [facility] for additional treatment and stabilization.He was started on trial of abilify and gabapentin, and he initially was refusing those medication. He barricaded himself in his room on 6/15, and he required IM medications to manage his agitation. Second opinion was obtained for forced medications; however, pt wastaking abilify. Pt reported ongoing symptoms of AH, and he requested change of medications, and he was started on trial of Invega with potential plan to transition to long-acting injectable form.  Today upon interview, Maurice [name] shares, "I'm feeling a little drowsy this morning. I had a visit with my parents &  sister yesterday evening. It was a good visit. I'm still refusing to take take the Invega monthly injections. I'm afraid of everything about this injection at this time. I will rather prefer the pills as I'm doing now. I'm having some tremors at this point while on the pill form. I'm afraid of the side effects associated with the long acting form of this Invega. I don't think that I will be attending any group sessions here due to the tremors I'm having. I will rate my mood at #6 today". Other than the above complaints, Maurice [name] denies any other physical symptoms. He denies SI/HI/AH/VH. He feels that he is tolerating his medications well, and he is in agreement to continue his current oral form of this Invega medicine.  Pt had no further questions, comments, or concerns. He remains Isolative in his room. Presents with blunted affect, somewhat disheveled as well.  Principal Problem: Bipolar I disorder, current or most recent episode manic, with psychotic features (HCC)  Diagnosis:   Patient Active Problem List   Diagnosis Date Noted  . Bipolar I disorder, current or most recent episode manic, with psychotic features (HCC) [F31.2] 04/21/2018  . Suicidal ideations [R45.851]   . Polysubstance (including opioids) dependence with physiological dependence (HCC) [F19.20] 04/02/2018  . 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 Delusions (HCC) [F22]    Total Time spent with patient: 15 minutes  Past Psychiatric History: See H&P  Past Medical History:  Past Medical History:  Diagnosis Date  . Anxiety   . Substance abuse (HCC)  History reviewed. No pertinent surgical history.  Family History: History reviewed. No pertinent family history.  Family Psychiatric  History: See H&P  Social History:  Social History   Substance and Sexual Activity  Alcohol Use Not Currently  . Alcohol/week: 0.0 oz     Social History   Substance and Sexual Activity  Drug Use No    Social  History   Socioeconomic History  . Marital status: Single    Spouse name: Not on file  . Number of children: Not on file  . Years of education: Not on file  . Highest education level: Not on file  Occupational History  . Not on file  Social Needs  . Financial resource strain: Not on file  . Food insecurity:    Worry: Not on file    Inability: Not on file  . Transportation needs:    Medical: Not on file    Non-medical: Not on file  Tobacco Use  . Smoking status: Former Games developer  . Smokeless tobacco: Never Used  Substance and Sexual Activity  . Alcohol use: Not Currently    Alcohol/week: 0.0 oz  . Drug use: No  . Sexual activity: Not on file  Lifestyle  . Physical activity:    Days per week: Not on file    Minutes per session: Not on file  . Stress: Not on file  Relationships  . Social connections:    Talks on phone: Not on file    Gets together: Not on file    Attends religious service: Not on file    Active member of club or organization: Not on file    Attends meetings of clubs or organizations: Not on file    Relationship status: Not on file  Other Topics Concern  . Not on file  Social History Narrative  . Not on file   Additional Social History:   Sleep: Good  Appetite:  Good  Current Medications: Current Facility-Administered Medications  Medication Dose Route Frequency Provider Last Rate Last Dose  . acetaminophen (TYLENOL) tablet 650 mg  650 mg Oral Q6H PRN Laveda Abbe, NP      . alum & mag hydroxide-simeth (MAALOX/MYLANTA) 200-200-20 MG/5ML suspension 30 mL  30 mL Oral Q4H PRN Laveda Abbe, NP      . hydrOXYzine (ATARAX/VISTARIL) tablet 50 mg  50 mg Oral Q6H PRN Micheal Likens, MD   50 mg at 04/25/18 2159  . ibuprofen (ADVIL,MOTRIN) tablet 600 mg  600 mg Oral Q6H PRN Micheal Likens, MD   600 mg at 04/25/18 2159  . LORazepam (ATIVAN) tablet 1 mg  1 mg Oral Q6H PRN Oneta Rack, NP   1 mg at 04/28/18 2221  .  magnesium hydroxide (MILK OF MAGNESIA) suspension 30 mL  30 mL Oral Daily PRN Laveda Abbe, NP      . OLANZapine zydis (ZYPREXA) disintegrating tablet 10 mg  10 mg Oral Q8H PRN Oneta Rack, NP      . ondansetron (ZOFRAN) tablet 4 mg  4 mg Oral Q8H PRN Micheal Likens, MD      . paliperidone (INVEGA SUSTENNA) injection 234 mg  234 mg Intramuscular Once Micheal Likens, MD       Followed by  . [START ON 05/02/2018] paliperidone (INVEGA SUSTENNA) injection 156 mg  156 mg Intramuscular Q28 days Jolyne Loa T, MD      . paliperidone (INVEGA) 24 hr tablet 6 mg  6 mg Oral Daily [place],  Burlene Arnt, MD   6 mg at 04/28/18 0827  . simethicone (MYLICON) chewable tablet 160 mg  160 mg Oral Q6H PRN Nira Conn A, NP      . traZODone (DESYREL) tablet 100 mg  100 mg Oral QHS PRN,MR X 1 Micheal Likens, MD   100 mg at 04/28/18 2220   Lab Results: No results found for this or any previous visit (from the past 48 hour(s)).  Blood Alcohol level:  Lab Results  Component Value Date   ETH <10 04/20/2018   Metabolic Disorder Labs: No results found for: HGBA1C, MPG No results found for: PROLACTIN No results found for: CHOL, TRIG, HDL, CHOLHDL, VLDL, LDLCALC  Physical Findings: AIMS: Facial and Oral Movements Muscles of Facial Expression: None, normal Lips and Perioral Area: None, normal Jaw: None, normal Tongue: None, normal,Extremity Movements Upper (arms, wrists, hands, fingers): None, normal Lower (legs, knees, ankles, toes): None, normal, Trunk Movements Neck, shoulders, hips: None, normal, Overall Severity Severity of abnormal movements (highest score from questions above): None, normal Incapacitation due to abnormal movements: None, normal Patient's awareness of abnormal movements (rate only patient's report): No Awareness, Dental Status Current problems with teeth and/or dentures?: No Does patient usually wear dentures?: No  CIWA:  CIWA-Ar  Total: 3 COWS:  COWS Total Score: 4  Musculoskeletal: Strength & Muscle Tone: within normal limits Gait & Station: normal Patient leans: N/A  Psychiatric Specialty Exam: Physical Exam  Nursing note and vitals reviewed.   Review of Systems  Constitutional: Negative for chills and fever.  Respiratory: Negative for cough and shortness of breath.   Cardiovascular: Negative for chest pain.  Gastrointestinal: Negative for abdominal pain, heartburn, nausea and vomiting.  Psychiatric/Behavioral: Negative for depression, hallucinations and suicidal ideas. The patient is not nervous/anxious and does not have insomnia.     Blood pressure 119/81, pulse (!) 149, temperature 98.5 F (36.9 C), temperature source Oral, resp. rate 16, height 5\' 8"  (1.727 m), weight 68 kg (150 lb).Body mass index is 22.81 kg/m.  General Appearance: Casual and Fairly Groomed  Eye Contact:  Good  Speech:  Clear and Coherent and Normal Rate  Volume:  Normal  Mood:  Euthymic  Affect:  Appropriate and Congruent  Thought Process:  Coherent and Goal Directed  Orientation:  Full (Time, Place, and Person)  Thought Content:  Logical  Suicidal Thoughts:  No  Homicidal Thoughts:  No  Memory:  Immediate;   Fair Recent;   Fair Remote;   Fair  Judgement:  Poor  Insight:  Lacking  Psychomotor Activity:  Normal  Concentration:  Concentration: Fair  Recall:  [employer] of Knowledge:  Fair  Language:  Fair  Akathisia:  No  Handed:    AIMS (if indicated):     Assets:  Resilience  ADL's:  Intact  Cognition:  WNL  Sleep:  Number of Hours: 6.75   Treatment Plan Summary: Daily contact with patient to assess and evaluate symptoms and progress in treatment and Medication management   -Continue inpatient hospitalization.  -Will continue today 04/29/2018 plan as below except where it is noted.  -Unspecified psychotic disorder  -Continue Invega 6 mg po qDay   -Start [place]. Administer Gean Birchwood  234mg  IM once today 04/28/18, (patient adamantly declines to receive this injection. Prefers to remain on the oral tablets instead. and plan for Gean Birchwood 156mg  [id number] starting on 05/02/18:  -Anxiety   -Continue vistaril 50mg  po q6h prn anxiety  -Agitation -Continue zydis 10mg  po q8h  prn agitation -Continue Ativan 1mg  po q6h prn agitation  -Insomnia  -Continuetrazodone 100mg  po qhs PRN isomnia (may repeat x1)  -Encourage participation in groups and therapeutic milieu  -Disposition planning will be ongoing  Maurice StammerAgnes Nwoko, NP, PMHNP, FNP-BC. 04/29/2018, 11:16 AMPatient ID: Maurice [name], male   DOB: Aug 29, 1986, 32 y.o.   MRN: [id number] .Marland Kitchen.Agree with NP Progress Note

## 2018-04-29 NOTE — Progress Notes (Signed)
Recreation Therapy Notes  Date: 6.21.19 Time: 1000 Location: [address] Dayroom  Group Topic: Impact of music  Goal Area(s) Addresses:  Patient will be able to identify the benefits of music. Patient will identify the emotions associated with music.  Patient will identify how music can be beneficial post d/c.  Intervention: Music Therapy  Activity:  LRT played various types of music to give patients a chance to experience how music plays a role on our emotions.  Education: Discharge Planning.   Education Outcome: Acknowledges education/In group clarification offered/Needs additional education.   Clinical Observations/Feedback: Pt did not attend group.    Caroll RancherMarjette Addi Pak, LRT/CTRS          Caroll RancherLindsay, Adan Beal A 04/29/2018 12:31 PM

## 2018-04-30 DIAGNOSIS — F23 Brief psychotic disorder: Secondary | ICD-10-CM

## 2018-04-30 NOTE — Progress Notes (Signed)
[facility] MD Progress Note  04/30/2018 10:55 AM Maurice [name]  MRN:  [id number]  Evaluation:  Maurice [name] seen resting in bed.  Presents flat, guarded, paranoid and delusional.  Continues to report medication side effects excessive tiredness.  It was reported that patient declined IM injection.  Patient was reports telepathic communication with the medical director at this facility.  Continues to endorse auditory hallucinations.  Denies homicidal or suicidal ideations.  Chart review family to follow-up with social worker on 05/02/2018.  Reports a fair appetite.  Reported patient is resting well throughout the night.  Support encouragement reassurance was provided.   History: per assessment note- Maurice [name] is a 32 y/o M with history of treatment for depression, ADHD, and polysubstance abuse who was admitted from [facility] on IVC initiated by his sister due to worsening symptoms of psychosis and depression including reporting that he has been telepathically communicating with [employer] and Beverly Sessions as well reporting that they have been telling him to kill himself. Pt also reportedly asked his family for a gun. Pt has recent history of concussion about 2 weeks ago and he has continued to have vertigo from that incident (Head CT was negative for acute intracranial abnormality).   Principal Problem: Bipolar I disorder, current or most recent episode manic, with psychotic features (HCC)  Diagnosis:   Patient Active Problem List   Diagnosis Date Noted  . Acute psychosis (HCC) [F23]   . Bipolar I disorder, current or most recent episode manic, with psychotic features (HCC) [F31.2] 04/21/2018  . Suicidal ideations [R45.851]   . Polysubstance (including opioids) dependence with physiological dependence (HCC) [F19.20] 04/02/2018  . 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 Delusions (HCC) [F22]    Total Time spent with patient: 15 minutes  Past Psychiatric History: See H&P  Past  Medical History:  Past Medical History:  Diagnosis Date  . Anxiety   . Substance abuse (HCC)    History reviewed. No pertinent surgical history.  Family History: History reviewed. No pertinent family history.  Family Psychiatric  History: See H&P  Social History:  Social History   Substance and Sexual Activity  Alcohol Use Not Currently  . Alcohol/week: 0.0 oz     Social History   Substance and Sexual Activity  Drug Use No    Social History   Socioeconomic History  . Marital status: Single    Spouse name: Not on file  . Number of children: Not on file  . Years of education: Not on file  . Highest education level: Not on file  Occupational History  . Not on file  Social Needs  . Financial resource strain: Not on file  . Food insecurity:    Worry: Not on file    Inability: Not on file  . Transportation needs:    Medical: Not on file    Non-medical: Not on file  Tobacco Use  . Smoking status: Former Games developer  . Smokeless tobacco: Never Used  Substance and Sexual Activity  . Alcohol use: Not Currently    Alcohol/week: 0.0 oz  . Drug use: No  . Sexual activity: Not on file  Lifestyle  . Physical activity:    Days per week: Not on file    Minutes per session: Not on file  . Stress: Not on file  Relationships  . Social connections:    Talks on phone: Not on file    Gets together: Not on file    Attends religious service: Not on file  Active member of club or organization: Not on file    Attends meetings of clubs or organizations: Not on file    Relationship status: Not on file  Other Topics Concern  . Not on file  Social History Narrative  . Not on file   Additional Social History:   Sleep: Good  Appetite:  Good  Current Medications: Current Facility-Administered Medications  Medication Dose Route Frequency Provider Last Rate Last Dose  . acetaminophen (TYLENOL) tablet 650 mg  650 mg Oral Q6H PRN Laveda AbbeParks, Laurie Britton, NP      . alum & mag  hydroxide-simeth (MAALOX/MYLANTA) 200-200-20 MG/5ML suspension 30 mL  30 mL Oral Q4H PRN Laveda AbbeParks, Laurie Britton, NP      . hydrOXYzine (ATARAX/VISTARIL) tablet 50 mg  50 mg Oral Q6H PRN Micheal Likensainville, Christopher T, MD   50 mg at 04/25/18 2159  . ibuprofen (ADVIL,MOTRIN) tablet 600 mg  600 mg Oral Q6H PRN Micheal Likensainville, Christopher T, MD   600 mg at 04/25/18 2159  . LORazepam (ATIVAN) tablet 1 mg  1 mg Oral Q6H PRN Oneta RackLewis, Glendel Jaggers N, NP   1 mg at 04/30/18 0826  . magnesium hydroxide (MILK OF MAGNESIA) suspension 30 mL  30 mL Oral Daily PRN Laveda AbbeParks, Laurie Britton, NP      . OLANZapine zydis (ZYPREXA) disintegrating tablet 10 mg  10 mg Oral Q8H PRN Oneta RackLewis, Sumayah Bearse N, NP      . ondansetron (ZOFRAN) tablet 4 mg  4 mg Oral Q8H PRN Micheal Likensainville, Christopher T, MD      . paliperidone (INVEGA SUSTENNA) injection 234 mg  234 mg Intramuscular Once Micheal Likensainville, Christopher T, MD       Followed by  . [START ON 05/02/2018] paliperidone (INVEGA SUSTENNA) injection 156 mg  156 mg Intramuscular Q28 days Jolyne Loaainville, Christopher T, MD      . paliperidone (INVEGA) 24 hr tablet 6 mg  6 mg Oral Daily Micheal Likensainville, Christopher T, MD   6 mg at 04/28/18 0827  . simethicone (MYLICON) chewable tablet 160 mg  160 mg Oral Q6H PRN Nira ConnBerry, Jason A, NP      . traZODone (DESYREL) tablet 100 mg  100 mg Oral QHS PRN,MR X 1 Micheal Likensainville, Christopher T, MD   100 mg at 04/29/18 2126   Lab Results: No results found for this or any previous visit (from the past 48 hour(s)).  Blood Alcohol level:  Lab Results  Component Value Date   ETH <10 04/20/2018   Metabolic Disorder Labs: No results found for: HGBA1C, MPG No results found for: PROLACTIN No results found for: CHOL, TRIG, HDL, CHOLHDL, VLDL, LDLCALC  Physical Findings: AIMS: Facial and Oral Movements Muscles of Facial Expression: None, normal Lips and Perioral Area: None, normal Jaw: None, normal Tongue: None, normal,Extremity Movements Upper (arms, wrists, hands, fingers): None, normal Lower  (legs, knees, ankles, toes): None, normal, Trunk Movements Neck, shoulders, hips: None, normal, Overall Severity Severity of abnormal movements (highest score from questions above): None, normal Incapacitation due to abnormal movements: None, normal Patient's awareness of abnormal movements (rate only patient's report): No Awareness, Dental Status Current problems with teeth and/or dentures?: No Does patient usually wear dentures?: No  CIWA:  CIWA-Ar Total: 3 COWS:  COWS Total Score: 4  Musculoskeletal: Strength & Muscle Tone: within normal limits Gait & Station: normal Patient leans: N/A  Psychiatric Specialty Exam: Physical Exam  Nursing note and vitals reviewed. Constitutional: He appears well-developed.  Neurological: He is alert.    Review of Systems  Cardiovascular: Negative  for chest pain.  Psychiatric/Behavioral: Negative for depression, hallucinations and suicidal ideas. The patient is not nervous/anxious and does not have insomnia.   All other systems reviewed and are negative.   Blood pressure (!) 144/93, pulse (!) 127, temperature 98.9 F (37.2 C), resp. rate 16, height 5\' 8"  (1.727 m), weight 68 kg (150 lb).Body mass index is 22.81 kg/m.  General Appearance: Casual and Guarded  Eye Contact:  Good  Speech:  Clear and Coherent and Normal Rate  Volume:  Normal  Mood:  Anxious  Affect:  Appropriate and Congruent  Thought Process:  Coherent and Goal Directed  Orientation:  Full (Time, Place, and Person)  Thought Content:  Logical and Hallucinations: Auditory  Suicidal Thoughts:  No  Homicidal Thoughts:  No  Memory:  Immediate;   Fair Recent;   Fair Remote;   Fair  Judgement:  Poor  Insight:  Lacking  Psychomotor Activity:  Normal  Concentration:  Concentration: Fair  Recall:  [employer] of Knowledge:  Fair  Language:  Fair  Akathisia:  No  Handed:    AIMS (if indicated):     Assets:  Resilience  ADL's:  Intact  Cognition:  WNL  Sleep:  Number of Hours:  6.5   Treatment Plan Summary: Daily contact with patient to assess and evaluate symptoms and progress in treatment and Medication management   -Will continue today 04/30/2018 plan as below except where it is noted.  -Unspecified psychotic disorder  -Continue Invega 6 mg po qDay   -Start [place]. Administer Hinda Glatter Sustenna 234mg  IM once today 04/28/18, (patient adamantly declines to receive this injection. Prefers to remain on the oral tablets instead.) and plan for Gean Birchwood 156mg  [id number] starting on 05/02/18:  -Anxiety   -Continue vistaril 50mg  po q6h prn anxiety  -Agitation -Continue zydis 10mg  po q8h prn agitation -Continue Ativan 1mg  po q6h prn agitation  -Insomnia  -Continuetrazodone 100mg  po qhs PRN isomnia (may repeat x1)  -Encourage participation in groups and therapeutic milieu -Disposition planning will be ongoing  Oneta Rack, NP 04/30/2018, 10:55 AM

## 2018-04-30 NOTE — [facility] Group Notes (Signed)
[facility] Group Notes: (Clinical Social Work)   04/30/2018      Type of Therapy:  Group Therapy   Participation Level:  Did Not Attend despite MHT prompting   Ambrose MantleMareida Grossman-Orr, LCSW 04/30/2018, 12:49 PM

## 2018-04-30 NOTE — [facility] Group Notes (Signed)
[facility] Group Notes:  (Nursing/MHT/Case Management/Adjunct)  Date:  04/30/2018  Time:  4:41 PM  Type of Therapy:  Psychoeducational Skills  Participation Level:  Did Not Attend  Summary of Progress/Problems: Patient did not attend group therapy session on anger management.  Kirstie MirzaJonathan C Jolinda Pinkstaff 04/30/2018, 4:41 PM

## 2018-04-30 NOTE — Progress Notes (Signed)
D-  Mood -  Affect - anxious/flat/depressed i  Behavior - appropriate with encouragement, direction and support.  Interacts appropriately with peers and staff.   Pt refused scheduled Invega 6 this morning NP made aware.  A- Medications per MD order.  Support given throughout the day,  1:1 spent time with patient.  R- Following treatment plan plan.  Pt denies SI/HI/AVH/Pain at this time.  Contracts for safety.

## 2018-04-30 NOTE — Progress Notes (Signed)
Did not attend group 

## 2018-04-30 NOTE — Progress Notes (Signed)
Pt was observed in his room, seen awake in bed. Pt was anxious/flat/depressed in affect and mood. Pt denies SI/HI/AVH/Pain at this time. Pt c/o of increasing anxiety this evening. Pt is isolative to room and does not attend groups. Pt states "I don't like being in social settings; it makes me very anxious". PRN ativan and trazodone requested and given. Will continue with POC

## 2018-05-01 MED ORDER — PALIPERIDONE ER 3 MG PO TB24
3.0000 mg | ORAL_TABLET | Freq: Every day | ORAL | Status: DC
  Administered 2018-05-02: 3 mg via ORAL
  Filled 2018-05-01 (×4): qty 1

## 2018-05-01 NOTE — Progress Notes (Signed)
D:  Patient's self inventory sheet, patient sleeps good, sleep medication helpful.  Good appetite, normal energy level, good concentration.  Denied depression, hopeless and anxiety.  Denied withdrawals.  Denied SI.  Denied physical pain.  Goal is to work on anxiety.  Plans to seek help.   A:  No medications given during this shift thus far.  Emotional support and encouragement given patient. R:  Denied SI and HI, contracts for safety.  Denied AV hallucinations.  Safety maintained with 15 minute checks.

## 2018-05-01 NOTE — Progress Notes (Signed)
Writer spoke with patient 1:1 at medication window. He has been isolative to his room tonight. He reports that he has bad social anxiety. He reports attempting to go to the cafeteria and it was too much for him. He received his trazodone to aid with sleep. He was given snack before retuning to his room. Support given and safety maintained on unit with 15 min checks.

## 2018-05-01 NOTE — [facility] Group Notes (Signed)
[facility] Group Notes:  (Nursing/MHT/Case Management/Adjunct)  Date:  05/01/2018  Time:  0900  Type of Therapy:  Psychoeducation Skills  Participation Level:  Did Not attend  Participation Quality:    Affect:    Cognitive:   Insight:   Engagement in Group:   Modes of Intervention:    Summary of Progress/Problems:  Earline MayotteKnight, Danely Bayliss Shephard 05/01/2018, 5:41 PM

## 2018-05-01 NOTE — [facility] Group Notes (Signed)
[facility] LCSW Group Therapy Note  Date/Time:  05/01/2018  11:00AM-12:00PM  Type of Therapy and Topic:  Group Therapy:  Music and Mood  Participation Level:  Did Not Attend   Description of Group: In this process group, members listened to a variety of genres of music and identified that different types of music evoke different responses.  Patients were encouraged to identify music that was soothing for them and music that was energizing for them.  Patients discussed how this knowledge can help with wellness and recovery in various ways including managing depression and anxiety as well as encouraging healthy sleep habits.    Therapeutic Goals: 1. Patients will explore the impact of different varieties of music on mood 2. Patients will verbalize the thoughts they have when listening to different types of music 3. Patients will identify music that is soothing to them as well as music that is energizing to them 4. Patients will discuss how to use this knowledge to assist in maintaining wellness and recovery 5. Patients will explore the use of music as a coping skill  Summary of Patient Progress:  N/A  Therapeutic Modalities: Solution Focused Brief Therapy Activity   Ambrose MantleMareida Grossman-Orr, LCSW

## 2018-05-01 NOTE — Plan of Care (Signed)
Nurse discussed anxiety and coping skills with patient. 

## 2018-05-01 NOTE — Progress Notes (Signed)
[facility] MD Progress Note  05/01/2018 8:45 AM Maurice [name]  MRN:  [id number]  Evaluation:  [name] seen resting in bed. Continue to present flat, guarded, paranoid and delusional. Per nursing staff patient continues to refuse medications daily. Patient continues to reports telepathic communications and inappropriate laughing. Continues to report medication side effects excessive tiredness with medications. Discussed decreasing invega 6 mg to 3 mg to help with "tiredness"  Patient reports a fair appetite and states he is resting well. Support encouragement reassurance was provided.  History: per assessment note- Maurice [name] is a 32 y/o M with history of treatment for depression, ADHD, and polysubstance abuse who was admitted from [facility] on IVC initiated by his sister due to worsening symptoms of psychosis and depression including reporting that he has been telepathically communicating with [employer] and Beverly Sessions as well reporting that they have been telling him to kill himself. Pt also reportedly asked his family for a gun. Pt has recent history of concussion about 2 weeks ago and he has continued to have vertigo from that incident (Head CT was negative for acute intracranial abnormality).   Principal Problem: Bipolar I disorder, current or most recent episode manic, with psychotic features (HCC)  Diagnosis:   Patient Active Problem List   Diagnosis Date Noted  . Acute psychosis (HCC) [F23]   . Bipolar I disorder, current or most recent episode manic, with psychotic features (HCC) [F31.2] 04/21/2018  . Suicidal ideations [R45.851]   . Polysubstance (including opioids) dependence with physiological dependence (HCC) [F19.20] 04/02/2018  . 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 Delusions (HCC) [F22]    Total Time spent with patient: 15 minutes  Past Psychiatric History: See H&P  Past Medical History:  Past Medical History:  Diagnosis Date  . Anxiety   . Substance  abuse (HCC)    History reviewed. No pertinent surgical history.  Family History: History reviewed. No pertinent family history.  Family Psychiatric  History: See H&P  Social History:  Social History   Substance and Sexual Activity  Alcohol Use Not Currently  . Alcohol/week: 0.0 oz     Social History   Substance and Sexual Activity  Drug Use No    Social History   Socioeconomic History  . Marital status: Single    Spouse name: Not on file  . Number of children: Not on file  . Years of education: Not on file  . Highest education level: Not on file  Occupational History  . Not on file  Social Needs  . Financial resource strain: Not on file  . Food insecurity:    Worry: Not on file    Inability: Not on file  . Transportation needs:    Medical: Not on file    Non-medical: Not on file  Tobacco Use  . Smoking status: Former Games developer  . Smokeless tobacco: Never Used  Substance and Sexual Activity  . Alcohol use: Not Currently    Alcohol/week: 0.0 oz  . Drug use: No  . Sexual activity: Not on file  Lifestyle  . Physical activity:    Days per week: Not on file    Minutes per session: Not on file  . Stress: Not on file  Relationships  . Social connections:    Talks on phone: Not on file    Gets together: Not on file    Attends religious service: Not on file    Active member of club or organization: Not on file    Attends meetings  of clubs or organizations: Not on file    Relationship status: Not on file  Other Topics Concern  . Not on file  Social History Narrative  . Not on file   Additional Social History:   Sleep: Good  Appetite:  Good  Current Medications: Current Facility-Administered Medications  Medication Dose Route Frequency Provider Last Rate Last Dose  . acetaminophen (TYLENOL) tablet 650 mg  650 mg Oral Q6H PRN Laveda Abbe, NP      . alum & mag hydroxide-simeth (MAALOX/MYLANTA) 200-200-20 MG/5ML suspension 30 mL  30 mL Oral Q4H PRN  Laveda Abbe, NP      . hydrOXYzine (ATARAX/VISTARIL) tablet 50 mg  50 mg Oral Q6H PRN Micheal Likens, MD   50 mg at 04/25/18 2159  . ibuprofen (ADVIL,MOTRIN) tablet 600 mg  600 mg Oral Q6H PRN Micheal Likens, MD   600 mg at 04/25/18 2159  . LORazepam (ATIVAN) tablet 1 mg  1 mg Oral Q6H PRN Oneta Rack, NP   1 mg at 04/30/18 2030  . magnesium hydroxide (MILK OF MAGNESIA) suspension 30 mL  30 mL Oral Daily PRN Laveda Abbe, NP      . OLANZapine zydis (ZYPREXA) disintegrating tablet 10 mg  10 mg Oral Q8H PRN Oneta Rack, NP      . ondansetron (ZOFRAN) tablet 4 mg  4 mg Oral Q8H PRN Micheal Likens, MD      . paliperidone (INVEGA SUSTENNA) injection 234 mg  234 mg Intramuscular Once Micheal Likens, MD       Followed by  . [START ON 05/02/2018] paliperidone (INVEGA SUSTENNA) injection 156 mg  156 mg Intramuscular Q28 days Jolyne Loa T, MD      . paliperidone (INVEGA) 24 hr tablet 6 mg  6 mg Oral Daily Micheal Likens, MD   6 mg at 04/28/18 0827  . simethicone (MYLICON) chewable tablet 160 mg  160 mg Oral Q6H PRN Nira Conn A, NP      . traZODone (DESYREL) tablet 100 mg  100 mg Oral QHS PRN,MR X 1 Micheal Likens, MD   100 mg at 04/30/18 2030   Lab Results: No results found for this or any previous visit (from the past 48 hour(s)).  Blood Alcohol level:  Lab Results  Component Value Date   ETH <10 04/20/2018   Metabolic Disorder Labs: No results found for: HGBA1C, MPG No results found for: PROLACTIN No results found for: CHOL, TRIG, HDL, CHOLHDL, VLDL, LDLCALC  Physical Findings: AIMS: Facial and Oral Movements Muscles of Facial Expression: None, normal Lips and Perioral Area: None, normal Jaw: None, normal Tongue: None, normal,Extremity Movements Upper (arms, wrists, hands, fingers): None, normal Lower (legs, knees, ankles, toes): None, normal, Trunk Movements Neck, shoulders, hips: None,  normal, Overall Severity Severity of abnormal movements (highest score from questions above): None, normal Incapacitation due to abnormal movements: None, normal Patient's awareness of abnormal movements (rate only patient's report): No Awareness, Dental Status Current problems with teeth and/or dentures?: No Does patient usually wear dentures?: No  CIWA:  CIWA-Ar Total: 3 COWS:  COWS Total Score: 4  Musculoskeletal: Strength & Muscle Tone: within normal limits Gait & Station: normal Patient leans: N/A  Psychiatric Specialty Exam: Physical Exam  Nursing note and vitals reviewed. Constitutional: He appears well-developed.  Neurological: He is alert.  Psychiatric: He has a normal mood and affect.    Review of Systems  Psychiatric/Behavioral: Negative for depression, hallucinations and suicidal  ideas. The patient is not nervous/anxious and does not have insomnia.   All other systems reviewed and are negative.   Blood pressure (!) 144/93, pulse (!) 127, temperature 98.9 F (37.2 C), resp. rate 16, height 5\' 8"  (1.727 m), weight 68 kg (150 lb).Body mass index is 22.81 kg/m.  General Appearance: Casual and Guarded  Eye Contact:  Good  Speech:  Clear and Coherent and Normal Rate  Volume:  Normal  Mood:  Anxious  Affect:  Congruent and Constricted  Thought Process:  Coherent and Goal Directed  Orientation:  Full (Time, Place, and Person)  Thought Content:  Logical and Hallucinations: Auditory  Suicidal Thoughts:  No  Homicidal Thoughts:  No  Memory:  Immediate;   Fair Recent;   Fair Remote;   Fair  Judgement:  Poor  Insight:  Lacking  Psychomotor Activity:  Normal  Concentration:  Concentration: Fair  Recall:  [employer]  Fund of Knowledge:  Fair  Language:  Fair  Akathisia:  No  Handed:    AIMS (if indicated):     Assets:  Resilience  ADL's:  Intact  Cognition:  WNL  Sleep:  Number of Hours: 6.75   Treatment Plan Summary: Daily contact with patient to assess and evaluate  symptoms and progress in treatment and Medication management   -Will continue today 05/01/2018 plan as below except where it is noted.  -Unspecified psychotic disorder  -Decreased Invega 6 mg to 3 mg  po qDay   -Start [place] Sust[place]. Administer Hinda Glatternvega Sustenna 234mg  IM once today 04/28/18, (patient adamantly declines to receive this injection. Prefers to remain on the oral tablets instead.)  and plan for Gean Birchwoodnvega Sustenna 156mg  [id number] starting on 05/02/18:  -Anxiety   -Continue vistaril 50mg  po q6h prn anxiety  -Agitation -Continue zydis 10mg  po q8h prn agitation -Continue Ativan 1mg  po q6h prn agitation  -Insomnia  -Continuetrazodone 100mg  po qhs PRN isomnia (may repeat x1)  -Encourage participation in groups and therapeutic milieu -Disposition planning will be ongoing  Oneta Rackanika N Therin Vetsch, NP 05/01/2018, 8:45 AM

## 2018-05-01 NOTE — [facility] Group Notes (Signed)
[facility] Group Notes:  (Nursing/MHT/Case Management/Adjunct)  Date:  05/01/2018  Time:  4:15 pm  Type of Therapy:  Psychoeducational Skills  Participation Level:  Did not attend  Participation Quality:    Affect:    Cognitive:    Insight:    Engagement in Group:    Modes of Intervention:    Summary of Progress/Problems:  Earline MayotteKnight, Emmakate Hypes Shephard 05/01/2018, 6:05 PM

## 2018-05-02 DIAGNOSIS — F410 Panic disorder [episodic paroxysmal anxiety] without agoraphobia: Secondary | ICD-10-CM

## 2018-05-02 DIAGNOSIS — F312 Bipolar disorder, current episode manic severe with psychotic features: Principal | ICD-10-CM

## 2018-05-02 NOTE — Plan of Care (Signed)
Nurse discussed anxiety and coping skills with patient. 

## 2018-05-02 NOTE — Progress Notes (Signed)
Pt did not attend group. 

## 2018-05-02 NOTE — Progress Notes (Signed)
Recreation Therapy Notes  Date: 6.24.19 Time: 1000 Location: [address] Dayroom  Group Topic: Wellness  Goal Area(s) Addresses:  Patient will define components of whole wellness. Patient will verbalize benefit of whole wellness.  Intervention: Music, Exercise  Activity: LRT lead patients through a series of stretches to loosen them up.  Patients were then given the opportunity to pick an exercise of their choice to lead the group in.  Education:Wellness, Discharge Planning.   Education Outcome: Acknowledges education/In group clarification offered/Needs additional education.   Clinical Observations/Feedback: Pt did not attend group.     Draxton Luu, LRT/CTRS          Misty Rago A 05/02/2018 11:12 AM 

## 2018-05-02 NOTE — Progress Notes (Signed)
[facility] Progress [facility]  05/02/2018 12:03 PM Maurice AcostaBenjamin V [name]  MRN:  [id number] Subjective:    Maurice SchoonerBenjamin [name] is a 32 y/o M with history of treatment for depression, ADHD, and polysubstance abuse who was admitted from [facility] on IVC initiated by his sister due to worsening symptoms of psychosis and depression including reporting that he has been telepathically communicating with Tesla and Beverly SessionsJohn Lennon as well reporting that they have been telling him to kill himself. Pt also reportedly asked his family for a gun. Pt has recent history of concussion about 2 weeks ago and he has continued to have vertigo from that incident (Head CT was negative for acute intracranial abnormality). Pt was medically stabilized and then [facility] and [facility].He was started on trial of abilify and gabapentin, and he initially was refusing those medication. He barricaded himself in his room on 6/15, and he required IM medications to manage his agitation. Second opinion was obtained for forced medications; however, pt wastaking abilify. Pt reported ongoing symptoms of AH, and he requested change of medications, and he was started on trial of Invega with potential plan to transition to long-acting injectable form; however, he has been declining to have long-acting injectable form. Pt reported some daytime sedation which was causing him to refuse oral form of Invega, and his dose was reduced yesterday (pt took it this AM).   Today upon interview, pt shares, "I'm having a bad panic attack. I can't make it stop. I'm having telepathy with the Tourette's." Pt reports he is telepathically communicating with multiple people including his parents, other patients, and staff at the hospital. He denies SI/HI/VH. He denies physical complaints. Discussed with patient that our goal is to reduce his anxiety and overall distress from his symptoms, and he had been improving prior to the weekend when he was taking  his medication, but how his symptoms have been worse since refusing the medications. Pt verbalized understanding. He requests to remain at same dose of Invega oral form, and he continues to decline to transition to long-acting injectable form. We will continue his current regimen without changes, and monitor for improvement with lower dose of oral Invega. Pt was in agreement with the above plan, and he had no further questions, comments, or concerns.   Principal Problem: Bipolar I disorder, current or most recent episode manic, with psychotic features (HCC) Diagnosis:   Patient Active Problem List   Diagnosis Date Noted  . Acute psychosis (HCC) [F23]   . Bipolar I disorder, current or most recent episode manic, with psychotic features (HCC) [F31.2] 04/21/2018  . Suicidal ideations [R45.851]   . Polysubstance (including opioids) dependence with physiological dependence (HCC) [F19.20] 04/02/2018  . 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 Delusions (HCC) [F22]    Total Time spent with patient: 30 minutes  Past Psychiatric History: see H&P  Past Medical History:  Past Medical History:  Diagnosis Date  . Anxiety   . Substance abuse (HCC)    History reviewed. No pertinent surgical history. Family History: History reviewed. No pertinent family history. Family Psychiatric  History: see H&P Social History:  Social History   Substance and Sexual Activity  Alcohol Use Not Currently  . Alcohol/week: 0.0 oz     Social History   Substance and Sexual Activity  Drug Use No    Social History   Socioeconomic History  . Marital status: Single    Spouse name: Not on file  . Number of children:  Not on file  . Years of education: Not on file  . Highest education level: Not on file  Occupational History  . Not on file  Social Needs  . Financial resource strain: Not on file  . Food insecurity:    Worry: Not on file    Inability: Not on file  . Transportation  needs:    Medical: Not on file    Non-medical: Not on file  Tobacco Use  . Smoking status: Former Games developer  . Smokeless tobacco: Never Used  Substance and Sexual Activity  . Alcohol use: Not Currently    Alcohol/week: 0.0 oz  . Drug use: No  . Sexual activity: Not on file  Lifestyle  . Physical activity:    Days per week: Not on file    Minutes per session: Not on file  . Stress: Not on file  Relationships  . Social connections:    Talks on phone: Not on file    Gets together: Not on file    Attends religious service: Not on file    Active member of club or organization: Not on file    Attends meetings of clubs or organizations: Not on file    Relationship status: Not on file  Other Topics Concern  . Not on file  Social History Narrative  . Not on file   Additional Social History:                         Sleep: Good  Appetite:  Fair  Current Medications: Current Facility-Administered Medications  Medication Dose Route Frequency Provider Last Rate Last Dose  . acetaminophen (TYLENOL) tablet 650 mg  650 mg Oral Q6H PRN Laveda Abbe, NP      . alum & mag hydroxide-simeth (MAALOX/MYLANTA) 200-200-20 MG/5ML suspension 30 mL  30 mL Oral Q4H PRN Laveda Abbe, NP      . hydrOXYzine (ATARAX/VISTARIL) tablet 50 mg  50 mg Oral Q6H PRN Micheal Likens, MD   50 mg at 04/25/18 2159  . ibuprofen (ADVIL,MOTRIN) tablet 600 mg  600 mg Oral Q6H PRN Micheal Likens, MD   600 mg at 04/25/18 2159  . LORazepam (ATIVAN) tablet 1 mg  1 mg Oral Q6H PRN Oneta Rack, NP   1 mg at 05/02/18 1123  . magnesium hydroxide (MILK OF MAGNESIA) suspension 30 mL  30 mL Oral Daily PRN Laveda Abbe, NP      . OLANZapine zydis (ZYPREXA) disintegrating tablet 10 mg  10 mg Oral Q8H PRN Oneta Rack, NP      . ondansetron (ZOFRAN) tablet 4 mg  4 mg Oral Q8H PRN Micheal Likens, MD      . paliperidone (INVEGA SUSTENNA) injection 234 mg  234 mg  Intramuscular Once Micheal Likens, MD       Followed by  . paliperidone (INVEGA SUSTENNA) injection 156 mg  156 mg Intramuscular Q28 days Jolyne Loa T, MD      . paliperidone (INVEGA) 24 hr tablet 3 mg  3 mg Oral Daily Oneta Rack, NP   3 mg at 05/02/18 0803  . simethicone (MYLICON) chewable tablet 160 mg  160 mg Oral Q6H PRN Nira Conn A, NP      . traZODone (DESYREL) tablet 100 mg  100 mg Oral QHS PRN,MR X 1 Micheal Likens, MD   100 mg at 05/01/18 2102    Lab Results: No results found for this  or any previous visit (from the past 48 hour(s)).  Blood Alcohol level:  Lab Results  Component Value Date   ETH <10 04/20/2018    Metabolic Disorder Labs: No results found for: HGBA1C, MPG No results found for: PROLACTIN No results found for: CHOL, TRIG, HDL, CHOLHDL, VLDL, LDLCALC  Physical Findings: AIMS: Facial and Oral Movements Muscles of Facial Expression: None, normal Lips and Perioral Area: None, normal Jaw: None, normal Tongue: None, normal,Extremity Movements Upper (arms, wrists, hands, fingers): None, normal Lower (legs, knees, ankles, toes): None, normal, Trunk Movements Neck, shoulders, hips: None, normal, Overall Severity Severity of abnormal movements (highest score from questions above): None, normal Incapacitation due to abnormal movements: None, normal Patient's awareness of abnormal movements (rate only patient's report): No Awareness, Dental Status Current problems with teeth and/or dentures?: No Does patient usually wear dentures?: No  CIWA:  CIWA-Ar Total: 1 COWS:  COWS Total Score: 2  Musculoskeletal: Strength & Muscle Tone: within normal limits Gait & Station: normal Patient leans: N/A  Psychiatric Specialty Exam: Physical Exam  Nursing note and vitals reviewed.   Review of Systems  Constitutional: Negative for chills and fever.  Respiratory: Negative for cough and shortness of breath.   Cardiovascular: Negative  for chest pain.  Gastrointestinal: Negative for abdominal pain, heartburn, nausea and vomiting.  Psychiatric/Behavioral: Negative for depression, hallucinations and suicidal ideas. The patient is not nervous/anxious and does not have insomnia.     Blood pressure (!) 143/91, pulse (!) 107, temperature 98.7 F (37.1 C), temperature source Oral, resp. rate 16, height 5\' 8"  (1.727 m), weight 68 kg (150 lb).Body mass index is 22.81 kg/m.  General Appearance: Casual and Fairly Groomed  Eye Contact:  Minimal  Speech:  Clear and Coherent and Normal Rate  Volume:  Normal  Mood:  Anxious and Dysphoric  Affect:  Blunt and Congruent  Thought Process:  Coherent, Goal Directed and Descriptions of Associations: Loose  Orientation:  Full (Time, Place, and Person)  Thought Content:  Delusions, Hallucinations: Auditory and Ideas of Reference:   Paranoia Delusions  Suicidal Thoughts:  No  Homicidal Thoughts:  No  Memory:  Immediate;   Fair Recent;   Fair Remote;   Fair  Judgement:  Poor  Insight:  Lacking  Psychomotor Activity:  Normal  Concentration:  Concentration: Fair  Recall:  [employer] of Knowledge:  Fair  Language:  Fair  Akathisia:  No  Handed:    AIMS (if indicated):     Assets:  Resilience Social Support  ADL's:  Intact  Cognition:  WNL  Sleep:  Number of Hours: 6.5   Treatment Plan Summary: Daily contact with patient to assess and evaluate symptoms and progress in treatment and Medication management    -Continue inpatient hospitalization  -Bipolar I, current episode manic with psychotic features  -Continue Invega 3mg  po qDay            -Anxiety   -Continue vistaril 50mg  po q6h prn anxiety  -Agitation -Continue zydis 10mg  po q8h prn agitation -Continue Ativan 1mg  po q6h prn agitation  -Insomnia  -Continuetrazodone 100mg  po qhs PRN isomnia (may repeat x1)  -Encourage participation in groups and  therapeutic milieu  -Disposition planning will be ongoing    Micheal Likens, MD 05/02/2018, 12:03 PM

## 2018-05-02 NOTE — [facility] Group Notes (Signed)
[facility] LCSW Group Therapy Note  Date/Time: 05/02/18, 1315  Type of Therapy and Topic:  Group Therapy:  Overcoming Obstacles  Participation Level:  Did not attend  Description of Group:    In this group patients will be encouraged to explore what they see as obstacles to their own wellness and recovery. They will be guided to discuss their thoughts, feelings, and behaviors related to these obstacles. The group will process together ways to cope with barriers, with attention given to specific choices patients can make. Each patient will be challenged to identify changes they are motivated to make in order to overcome their obstacles. This group will be process-oriented, with patients participating in exploration of their own experiences as well as giving and receiving support and challenge from other group members.  Therapeutic Goals: 1. Patient will identify personal and current obstacles as they relate to admission. 2. Patient will identify barriers that currently interfere with their wellness or overcoming obstacles.  3. Patient will identify feelings, thought process and behaviors related to these barriers. 4. Patient will identify two changes they are willing to make to overcome these obstacles:    Summary of Patient Progress      Therapeutic Modalities:   Cognitive Behavioral Therapy Solution Focused Therapy Motivational Interviewing Relapse Prevention Therapy  Greg Quinci Gavidia, LCSW 

## 2018-05-02 NOTE — Progress Notes (Signed)
D:  Patient refused to fill out self inventory sheet today.  Patient denied SI and HI while talking to nurse, contracts for safety.   Denied A/V hallucinations.  Denied pain. A:  Medications administered per MD orders.  Emotional support and encouragement given patient. R:  Safety maintained with 15 minute checks.  This afternoon, patient walked to entrance of [address] hall and stated he wanted out.  Patient was redirected to his room.  Patient has stayed in bed most of the day.  Meals are brought to patient because of his refusal to go to dining room.  Patient told nurse this morning that his mother has problems with anxiety, and mom is not able to work.

## 2018-05-02 NOTE — Tx Team (Signed)
Interdisciplinary Treatment and Diagnostic Plan Update  05/02/2018 Time of Session: [place] MRN: [id number]  Principal Diagnosis: Bipolar I disorder, current or most recent episode manic, with psychotic features ([place])  Secondary Diagnoses: Principal Problem:   Bipolar I disorder, current or most recent episode manic, with psychotic features ([place]) Active Problems:   Acute psychosis ([place])   Current Medications:  Current Facility-Administered Medications  Medication Dose Route Frequency Provider Last Rate Last Dose  . acetaminophen (TYLENOL) tablet 650 mg  650 mg Oral Q6H PRN Ethelene Hal, NP      . alum & mag hydroxide-simeth (MAALOX/MYLANTA) 200-200-20 MG/5ML suspension 30 mL  30 mL Oral Q4H PRN Ethelene Hal, NP      . hydrOXYzine (ATARAX/VISTARIL) tablet 50 mg  50 mg Oral Q6H PRN Pennelope Bracken, MD   50 mg at 04/25/18 2159  . ibuprofen (ADVIL,MOTRIN) tablet 600 mg  600 mg Oral Q6H PRN Pennelope Bracken, MD   600 mg at 04/25/18 2159  . LORazepam (ATIVAN) tablet 1 mg  1 mg Oral Q6H PRN Derrill Center, NP   1 mg at 05/01/18 1550  . magnesium hydroxide (MILK OF MAGNESIA) suspension 30 mL  30 mL Oral Daily PRN Ethelene Hal, NP      . OLANZapine zydis (ZYPREXA) disintegrating tablet 10 mg  10 mg Oral Q8H PRN Derrill Center, NP      . ondansetron (ZOFRAN) tablet 4 mg  4 mg Oral Q8H PRN Pennelope Bracken, MD      . paliperidone (INVEGA SUSTENNA) injection 234 mg  234 mg Intramuscular Once Pennelope Bracken, MD       Followed by  . paliperidone (INVEGA SUSTENNA) injection 156 mg  156 mg Intramuscular Q28 days Maris Berger T, MD      . paliperidone (INVEGA) 24 hr tablet 3 mg  3 mg Oral Daily Derrill Center, NP   3 mg at 05/02/18 0803  . simethicone (MYLICON) chewable tablet 160 mg  160 mg Oral Q6H PRN Lindon Romp A, NP      . traZODone (DESYREL) tablet 100 mg  100 mg Oral QHS PRN,MR X 1 Pennelope Bracken,  MD   100 mg at 05/01/18 2102    PTA Medications: Medications Prior to Admission  Medication Sig Dispense Refill Last Dose  . ALPRAZolam (XANAX) 1 MG tablet Take 1 mg by mouth daily as needed.  4 04/20/2018 at Unknown time  . amitriptyline (ELAVIL) 25 MG tablet Take 1 tablet (25 mg total) by mouth at bedtime. (Patient not taking: Reported on 04/20/2018) 10 tablet 0 Not Taking at Unknown time  . amphetamine-dextroamphetamine (ADDERALL) 20 MG tablet Take 20 mg by mouth daily.  0 Past Week at Unknown time  . Buprenorphine HCl-Naloxone HCl 8-2 MG FILM Place 1 tablet under the tongue daily.    04/01/2018 at Unknown time  . ibuprofen (ADVIL,MOTRIN) 800 MG tablet Take 1 tablet (800 mg total) by mouth 3 (three) times daily. 21 tablet 0 Past Week at Unknown time  . Ibuprofen-diphenhydrAMINE Cit (ADVIL PM) 200-38 MG TABS Take 2 tablets by mouth at bedtime as needed (pain).   Past Week at Unknown time  . meclizine (ANTIVERT) 25 MG tablet Take 1 tablet (25 mg total) by mouth 3 (three) times daily as needed for dizziness. 30 tablet 0 Past Week at Unknown time  . mometasone (NASONEX) 50 MCG/ACT nasal spray Place 2 sprays into the nose daily. (Patient not taking: Reported on  04/20/2018) 17 g 1 Not Taking at Unknown time  . ondansetron (ZOFRAN ODT) 4 MG disintegrating tablet Take 1 tablet (4 mg total) by mouth every 8 (eight) hours as needed for nausea. (Patient not taking: Reported on 04/20/2018) 10 tablet 0 Not Taking at Unknown time  . promethazine (PHENERGAN) 25 MG tablet Take 1 tablet (25 mg total) by mouth every 8 (eight) hours as needed for nausea or vomiting. 20 tablet 0 Past Week at Unknown time    Patient Stressors: Medication change or noncompliance  Patient Strengths: Communication skills  Treatment Modalities: Medication Management, Group therapy, Case management,  1 to 1 session with clinician, Psychoeducation, Recreational therapy.   Physician Treatment Plan for Primary Diagnosis: Bipolar I  disorder, current or most recent episode manic, with psychotic features ([place]) Long Term Goal(s): Improvement in symptoms so as ready for discharge  Short Term Goals: Ability to identify and develop effective coping behaviors will improve Ability to identify changes in lifestyle to reduce recurrence of condition will improve  Medication Management: Evaluate patient's response, side effects, and tolerance of medication regimen.  Therapeutic Interventions: 1 to 1 sessions, Unit Group sessions and Medication administration.  Evaluation of Outcomes: Progressing   6/19: "There's something called the trickster that controls me and makes me say things. He repeats things over and over in my head." When asked to provide an example, pt describes the thoughts as derogatory and ego dystonic, and he provides example, "He says that me and my wife don't know each other." Discussed with patient about changing abilify to a different medication to address AH and paranoia, and he was in agreement to trial of [place]. Discussed with patient that likely recommendation if he has good tolerability and efficacy will be to transition to long-acting injectable form, and pt verbalized understanding.    Physician Treatment Plan for Secondary Diagnosis: Principal Problem:   Bipolar I disorder, current or most recent episode manic, with psychotic features ([place]) Active Problems:   Acute psychosis ([place])   Long Term Goal(s): Improvement in symptoms so as ready for discharge  Short Term Goals: Ability to identify and develop effective coping behaviors will improve Ability to identify changes in lifestyle to reduce recurrence of condition will improve  Medication Management: Evaluate patient's response, side effects, and tolerance of medication regimen.  Therapeutic Interventions: 1 to 1 sessions, Unit Group sessions and Medication administration.  Evaluation of Outcomes: Progressing   RN Treatment Plan for Primary  Diagnosis: Bipolar I disorder, current or most recent episode manic, with psychotic features ([place]) Long Term Goal(s): Knowledge of disease and therapeutic regimen to maintain health will improve  Short Term Goals: Ability to identify and develop effective coping behaviors will improve and Compliance with prescribed medications will improve  Medication Management: RN will administer medications as ordered by provider, will assess and evaluate patient's response and provide education to patient for prescribed medication. RN will report any adverse and/or side effects to prescribing provider.  Therapeutic Interventions: 1 on 1 counseling sessions, Psychoeducation, Medication administration, Evaluate responses to treatment, Monitor vital signs and CBGs as ordered, Perform/monitor CIWA, COWS, AIMS and Fall Risk screenings as ordered, Perform wound care treatments as ordered.  Evaluation of Outcomes: Progressing   LCSW Treatment Plan for Primary Diagnosis: Bipolar I disorder, current or most recent episode manic, with psychotic features ([place]) Long Term Goal(s): Safe transition to appropriate next level of care at discharge, Engage patient in therapeutic group addressing interpersonal concerns.  Short Term Goals: Engage patient in aftercare planning  with referrals and resources  Therapeutic Interventions: Assess for all discharge needs, 1 to 1 time with Social worker, Explore available resources and support systems, Assess for adequacy in community support network, Educate family and significant other(s) on suicide prevention, Complete Psychosocial Assessment, Interpersonal group therapy.  Evaluation of Outcomes: Met  Return home, follow up Dr [facility]   Progress in Treatment: Attending groups: No Participating in groups: No Taking medication as prescribed: Yes Toleration medication: Yes, no side effects reported at this time Family/Significant other contact made: Yes Patient understands diagnosis:  No Limited insight Discussing patient identified problems/goals with staff: Yes Medical problems stabilized or resolved: Yes Denies suicidal/homicidal ideation: Yes Issues/concerns per patient self-inventory: None Other: N/A  New problem(s) identified: None identified at this time.   New Short Term/Long Term Goal(s): "I'm just here to relax."  Discharge Plan or Barriers:   Reason for Continuation of Hospitalization:  Delusions   Medication stabilization   Estimated Length of Stay: 2-4 days Attendees: Patient: 05/02/2018   Physician: Dr. Nancy Fetter, MD 05/02/2018   Nursing: Sena Hitch, RN 05/02/2018   RN Care Manager: 05/02/2018   Social Worker: Lurline Idol, LCSW 05/02/2018   Recreational Therapist:  05/02/2018   Other:  05/02/2018   Other:  05/02/2018   Other: 05/02/2018         Scribe for Treatment Team:  Lurline Idol, LCSW

## 2018-05-03 MED ORDER — ZIPRASIDONE MESYLATE 20 MG IM SOLR: 20.0000 mg | INTRAMUSCULAR | Status: DC | PRN

## 2018-05-03 MED ORDER — OLANZAPINE 10 MG PO TABS
10.0000 mg | ORAL_TABLET | Freq: Every day | ORAL | Status: DC
  Administered 2018-05-03 – 2018-05-07 (×5): 10 mg via ORAL
  Filled 2018-05-03 (×9): qty 1

## 2018-05-03 MED ORDER — OLANZAPINE 10 MG PO TBDP: 10.0000 mg | ORAL_TABLET | Freq: Three times a day (TID) | ORAL | Status: DC | PRN

## 2018-05-03 MED ORDER — OLANZAPINE 10 MG IM SOLR
10.0000 mg | Freq: Every day | INTRAMUSCULAR | Status: DC
  Filled 2018-05-03 (×4): qty 10

## 2018-05-03 NOTE — Plan of Care (Signed)
Maurice [name] has not been attending groups, he continues to complain of too much anxiety to be around people.  He does not go to the cafeteria for meals and mainly sits/lays in his room alone.  Staff continue to encourage participation in group and unit activities.  We will continue to monitor the progress towards his goals.

## 2018-05-03 NOTE — Progress Notes (Signed)
Patient denies SI, HI and AVH but reports increased anxiety.  Patient was offered anit-anxiety medications but stated "I don't want to take them because they make me feel like something is in my chest."  Patient has isolated to his room this shift and has not engaged in groups or unit activities.   Assess patient for safety, offer medications as prescribed, engage patient in 1:1 staff talks, encourage patient out of room.   Patient able to contract for safety.  Continue to monitor as prescribed   

## 2018-05-03 NOTE — Progress Notes (Signed)
[facility] MD Progress Note  05/03/2018 5:23 PM SHERWIN HOLLINGSHED  MRN:  [id number] Subjective:    Maurice [name] is a 32 y/o M with history of treatment for depression, ADHD, and polysubstance abuse who was admitted from [facility] on IVC initiated by his sister due to worsening symptoms of psychosis and depression including reporting that he has been telepathically communicating with Tesla and Beverly Sessions as well reporting that they have been telling him to kill himself. Pt also reportedly asked his family for a gun. Pt has recent history of concussion about 2 weeks ago and he has continued to have vertigo from that incident (Head CT was negative for acute intracranial abnormality). Pt was medically stabilized and then transferred to [facility] for additional treatment and stabilization.He was started on trial of abilify and gabapentin, and he initially was refusing those medication. He barricaded himself in his room on 6/15, and he required IM medications to manage his agitation. Second opinion was obtained for forced medications; however, ptwas initiallytaking abilify. Pt reported ongoing symptoms of AH, and he requested change of medications, and he was started on trial of Invega with potential plan to transition to long-acting injectable form; however, he has been declining to have long-acting injectable form. Pt reported some daytime sedation which was causing him to refuse oral form of Invega, and his dose was reduced. Pt took dose of Invega yesterday, but he has again been refusing this AM.  Today upon interview,pt declines to speak to this provider multiple times during morning and afternoon, until interview was conducted in pt's room. Pt states, "I'm not talking to anybody." Pt was asked about having telepathic communication, which he confirms has been happening. He denies SI/HI/VH. He refuses to answer any more questions. He turns over in bed as to avoid eye contact, and then he got up and entered the bathroom.  Attempted to discuss with patient that he will need to take some form of medication, or we will obtain a second opinion for forced medications again. Pt refused to answer any further questions or discuss medications with this provider. Informed pt we will plan to obtain second opinion for forced medications from Dr. Jama Flavors and then start Zyprexa PO or IM tonight.  Principal Problem: Bipolar I disorder, current or most recent episode manic, with psychotic features (HCC) Diagnosis:   Patient Active Problem List   Diagnosis Date Noted  . Acute psychosis (HCC) [F23]   . Bipolar I disorder, current or most recent episode manic, with psychotic features (HCC) [F31.2] 04/21/2018  . Suicidal ideations [R45.851]   . Polysubstance (including opioids) dependence with physiological dependence (HCC) [F19.20] 04/02/2018  . 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 Delusions (HCC) [F22]    Total Time spent with patient: 30 minutes  Past Psychiatric History: see H&P  Past Medical History:  Past Medical History:  Diagnosis Date  . Anxiety   . Substance abuse (HCC)    History reviewed. No pertinent surgical history. Family History: History reviewed. No pertinent family history. Family Psychiatric  History: see H&P Social History:  Social History   Substance and Sexual Activity  Alcohol Use Not Currently  . Alcohol/week: 0.0 oz     Social History   Substance and Sexual Activity  Drug Use No    Social History   Socioeconomic History  . Marital status: Single    Spouse name: Not on file  . Number of children: Not on file  . Years of education: Not on file  .  Highest education level: Not on file  Occupational History  . Not on file  Social Needs  . Financial resource strain: Not on file  . Food insecurity:    Worry: Not on file    Inability: Not on file  . Transportation needs:    Medical: Not on file    Non-medical: Not on file  Tobacco Use  . Smoking status:  Former Games developermoker  . Smokeless tobacco: Never Used  Substance and Sexual Activity  . Alcohol use: Not Currently    Alcohol/week: 0.0 oz  . Drug use: No  . Sexual activity: Not on file  Lifestyle  . Physical activity:    Days per week: Not on file    Minutes per session: Not on file  . Stress: Not on file  Relationships  . Social connections:    Talks on phone: Not on file    Gets together: Not on file    Attends religious service: Not on file    Active member of club or organization: Not on file    Attends meetings of clubs or organizations: Not on file    Relationship status: Not on file  Other Topics Concern  . Not on file  Social History Narrative  . Not on file   Additional Social History:                         Sleep: Poor  Appetite:  Fair  Current Medications: Current Facility-Administered Medications  Medication Dose Route Frequency Provider Last Rate Last Dose  . acetaminophen (TYLENOL) tablet 650 mg  650 mg Oral Q6H PRN Laveda AbbeParks, Laurie Britton, NP      . alum & mag hydroxide-simeth (MAALOX/MYLANTA) 200-200-20 MG/5ML suspension 30 mL  30 mL Oral Q4H PRN Laveda AbbeParks, Laurie Britton, NP      . hydrOXYzine (ATARAX/VISTARIL) tablet 50 mg  50 mg Oral Q6H PRN Micheal Likensainville, Jakai Onofre T, MD   50 mg at 04/25/18 2159  . ibuprofen (ADVIL,MOTRIN) tablet 600 mg  600 mg Oral Q6H PRN Micheal Likens[place]ville, Delila Kuklinski T, MD   600 mg at 04/25/18 2159  . magnesium hydroxide (MILK OF MAGNESIA) suspension 30 mL  30 mL Oral Daily PRN Laveda AbbeParks, Laurie Britton, NP      . OLANZapine [facility](ZYPREXA) tablet 10 mg  10 mg Oral QHS Micheal Likensainville, Krystofer Hevener T, MD       Or  . OLANZapine (ZYPREXA) injection 10 mg  10 mg Intramuscular QHS Jolyne Loaainville, Artis Beggs T, MD      . OLANZapine zydis (ZYPREXA) disintegrating tablet 10 mg  10 mg Oral Q8H PRN Micheal Likensainville, Ralph Benavidez T, MD       And  . ziprasidone (GEODON) injection 20 mg  20 mg Intramuscular PRN Micheal Likens[place]ville, Yurianna Tusing T, MD      . ondansetron (ZOFRAN) tablet 4 mg   4 mg Oral Q8H PRN Micheal Likens[place], Barbie Croston T, MD      . simethicone (MYLICON) chewable tablet 160 mg  160 mg Oral Q6H PRN Nira ConnBerry, Jason A, NP      . traZODone (DESYREL) tablet 100 mg  100 mg Oral QHS PRN,MR X 1 Micheal Likens[place], Mckinzey Entwistle T, MD   100 mg at 05/02/18 2121    Lab Results: No results found for this or any previous visit (from the past 48 hour(s)).  Blood Alcohol level:  Lab Results  Component Value Date   ETH <10 04/20/2018    Metabolic Disorder Labs: No results found for: HGBA1C, MPG No results found for: PROLACTIN No  results found for: CHOL, TRIG, HDL, CHOLHDL, VLDL, LDLCALC  Physical Findings: AIMS: Facial and Oral Movements Muscles of Facial Expression: None, normal Lips and Perioral Area: None, normal Jaw: None, normal Tongue: None, normal,Extremity Movements Upper (arms, wrists, hands, fingers): None, normal Lower (legs, knees, ankles, toes): None, normal, Trunk Movements Neck, shoulders, hips: None, normal, Overall Severity Severity of abnormal movements (highest score from questions above): None, normal Incapacitation due to abnormal movements: None, normal Patient's awareness of abnormal movements (rate only patient's report): No Awareness, Dental Status Current problems with teeth and/or dentures?: No Does patient usually wear dentures?: No  CIWA:  CIWA-Ar Total: 1 COWS:  COWS Total Score: 2  Musculoskeletal: Strength & Muscle Tone: within normal limits Gait & Station: normal Patient leans: N/A  Psychiatric Specialty Exam: Physical Exam  Nursing note and vitals reviewed.   Review of Systems  Psychiatric/Behavioral: Positive for hallucinations.    Blood pressure (!) 146/95, pulse (!) 110, temperature 98.1 F (36.7 C), temperature source Oral, resp. rate 16, height 5\' 8"  (1.727 m), weight 68 kg (150 lb).Body mass index is 22.81 kg/m.  General Appearance: Casual  Eye Contact:  Minimal  Speech:  Clear and Coherent  Volume:  Normal  Mood:  Irritable   Affect:  Blunt and Congruent  Thought Process:  Coherent and Goal Directed  Orientation:  Full (Time, Place, and Person)  Thought Content:  Hallucinations: Auditory  Suicidal Thoughts:  No  Homicidal Thoughts:  No  Memory:  Immediate;   Fair Recent;   Fair Remote;   Fair  Judgement:  Poor  Insight:  Lacking  Psychomotor Activity:  Normal  Concentration:  Concentration: Fair  Recall:  [employer] of Knowledge:  Fair  Language:  Fair  Akathisia:  No  Handed:    AIMS (if indicated):     Assets:  Resilience Social Support  ADL's:  Intact  Cognition:  WNL  Sleep:  Number of Hours: 4.5   Treatment Plan Summary: Daily contact with patient to assess and evaluate symptoms and progress in treatment and Medication management   -Continue inpatient hospitalization  -Request for second opinion for forced medications from Dr. Jama Flavors   -Bipolar I, current episode manic with psychotic features  -DiscontinueInvega 3mg  po qDay (pt refusing)   -Start zyprexa 10mg  po qhs (Or zyprexa 10mg  IM qhs if pt refuses oral form and if Dr. Jama Flavors is in agreement with plan for medications against pt's will with present documentation in the chart to reflect that opinion)  -Anxiety   -Continue vistaril 50mg  po q6h prn anxiety  -Agitation -Continue zydis 10mg  po q8h prn agitation -Discontinue Ativan 1mg  po q6h prn agitation   -Continue geodon 20mg  IM q12h prn severe agitation and unable to take oral medication  -Insomnia  -Continuetrazodone 100mg  po qhs PRN isomnia (may repeat x1)  -Encourage participation in groups and therapeutic milieu  -Disposition planning will be ongoing    Micheal Likens, MD 05/03/2018, 5:23 PM

## 2018-05-03 NOTE — Progress Notes (Signed)
Did not attend group 

## 2018-05-03 NOTE — Progress Notes (Signed)
A:  Maurice [name] has remained in his room all evening.  He did come out of his room to take his hs medications.  He denied SI/HI or A/V hallucinations.  He complained of "severe anxiety."  He took ativan prior to shift change and he stated that it was effective.  He talked about "always having anxiety, even as a child" and doesn't like to be around people.  He complained of "lots of dreams" last night with the trazodone and is hoping that it will be better tonight.  He denied any pain or discomfort and appeared to be in no physical distress.   A:  1:1 with RN for support and encouragement.  Medications as ordered.  Q 15 minute checks maintained for safety.  Encouraged participation in group and unit activities.   R:  Maurice [name] remains safe on the unit.  We will continue to monitor the progress towards his goals.

## 2018-05-04 MED ORDER — GABAPENTIN 300 MG PO CAPS
300.0000 mg | ORAL_CAPSULE | Freq: Three times a day (TID) | ORAL | Status: DC
  Administered 2018-05-04 – 2018-05-07 (×4): 300 mg via ORAL
  Filled 2018-05-04 (×13): qty 1

## 2018-05-04 MED ORDER — ZIPRASIDONE MESYLATE 20 MG IM SOLR: 20.0000 mg | INTRAMUSCULAR | Status: DC | PRN

## 2018-05-04 MED ORDER — OLANZAPINE 5 MG PO TBDP
ORAL_TABLET | ORAL | Status: AC
  Filled 2018-05-04: qty 1

## 2018-05-04 MED ORDER — OLANZAPINE 5 MG PO TBDP
5.0000 mg | ORAL_TABLET | Freq: Three times a day (TID) | ORAL | Status: DC | PRN
  Administered 2018-05-04 – 2018-05-12 (×4): 5 mg via ORAL
  Filled 2018-05-04 (×3): qty 1

## 2018-05-04 NOTE — Progress Notes (Signed)
Patient denies SI, HI and AVH but reports increased anxiety.  Patient was offered anit-anxiety medications but stated "I don't want to take them because they make me feel like something is in my chest."  Patient has isolated to his room this shift and has not engaged in groups or unit activities.   Assess patient for safety, offer medications as prescribed, engage patient in 1:1 staff talks, encourage patient out of room.   Patient able to contract for safety.  Continue to monitor as prescribed

## 2018-05-04 NOTE — Progress Notes (Signed)
[facility]  05/04/2018 3:24 PM Maurice [name]  MRN:  [id number] Subjective:    Maurice [name] is a 32 y/o M with history of treatment for depression, ADHD, and polysubstance abuse who was admitted from [facility] on IVC initiated by his sister due to worsening symptoms of psychosis and depression including reporting that he has been telepathically communicating with Maurice [name] and Maurice [name] as well reporting that they have been telling him to kill himself. Pt also reportedly asked his family for a gun. Pt has recent history of concussion about 2 weeks ago and he has continued to have vertigo from that incident (Head CT was negative for acute intracranial abnormality). Pt was medically stabilized and then [facility] and [facility].He was started on trial of abilify and gabapentin, and he initially was refusing those medication. He barricaded himself in his room on 6/15, and he required IM medications to manage his agitation. Second opinion was obtained for forced medications; however, ptwas initiallytaking abilify. Pt reported ongoing symptoms of AH, and he requested change of medications, and he was started on trial of Invega with potential plan to transition to long-acting injectable form; however, he has been declining to have long-acting injectable form. Pt reported some daytime sedation which was causing him to refuse oral form of Invega, and his dose was reduced. Pt had taken lower dose of Invega once, but then he began to refuse it again. Second opinion for forced medications was requested again. Pt was [place] from [place] and [place]oraz[place](he had been focusing on that medication and previously was misusing xanax prior to admission), and he was started on trial of zyprexa which he took orally last evening.  Today upon interview,pt states, "I'm not very well. I'm having a ton of anxiety." Pt is irritable and minimal with his responses, and he perseverates on  obtaining benzodiazepines to treat his anxiety. He denies other specific complaints. He has been sleeping well. His appetite is good. He reports that he continues to telepathically communicate with others. He shares, "I knew you were going to say that - I'm telepathic." When asked who else he is communicating with telepathically, pt is guarded, and he replies, "That's personal." He denies SI/HI/VH. Discussed with patient about treatment options. We discussed that to address his anxiety, recommendation is for trial of gabapentin and we will not restart a benzodiazepine during this admission, and pt was in agreement to trial of gabapentin. Additionally, offered to patient to have PRN of zyprexa to address psychosis/anxiety during the daytime, if necessary, and he was in agreement. Discussed with patient that we will continue zyprexa at bedtime, and pt verbalized understanding. He had no further questions, comments, or concerns.  Principal Problem: Bipolar I disorder, current or most recent episode manic, with psychotic features (HCC) Diagnosis:   Patient Active Problem List   Diagnosis Date Noted  . Acute psychosis (HCC) [F23]   . Bipolar I disorder, current or most recent episode manic, with psychotic features (HCC) [F31.2] 04/21/2018  . Suicidal ideations [R45.851]   . Polysubstance (including opioids) dependence with physiological dependence (HCC) [F19.20] 04/02/2018  . 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 Delusions (HCC) [F22]    Total Time spent with patient: 30 minutes  Past Psychiatric History: See H&P  Past Medical History:  Past Medical History:  Diagnosis Date  . Anxiety   . Substance abuse (HCC)    History reviewed. No pertinent surgical history. Family History: History reviewed. No pertinent family  history. Family Psychiatric  History: see H&P Social History:  Social History   Substance and Sexual Activity  Alcohol Use Not Currently  . Alcohol/week:  0.0 oz     Social History   Substance and Sexual Activity  Drug Use No    Social History   Socioeconomic History  . Marital status: Single    Spouse name: Not on file  . Number of children: Not on file  . Years of education: Not on file  . Highest education level: Not on file  Occupational History  . Not on file  Social Needs  . Financial resource strain: Not on file  . Food insecurity:    Worry: Not on file    Inability: Not on file  . Transportation needs:    Medical: Not on file    Non-medical: Not on file  Tobacco Use  . Smoking status: Former Games developer  . Smokeless tobacco: Never Used  Substance and Sexual Activity  . Alcohol use: Not Currently    Alcohol/week: 0.0 oz  . Drug use: No  . Sexual activity: Not on file  Lifestyle  . Physical activity:    Days per week: Not on file    Minutes per session: Not on file  . Stress: Not on file  Relationships  . Social connections:    Talks on phone: Not on file    Gets together: Not on file    Attends religious service: Not on file    Active member of club or organization: Not on file    Attends meetings of clubs or organizations: Not on file    Relationship status: Not on file  Other Topics Concern  . Not on file  Social History Narrative  . Not on file   Additional Social History:                         Sleep: Good  Appetite:  Good  Current Medications: Current Facility-Administered Medications  Medication Dose Route Frequency Provider Last Rate Last Dose  . acetaminophen (TYLENOL) tablet 650 mg  650 mg Oral Q6H PRN Laveda Abbe, NP      . alum & mag hydroxide-simeth (MAALOX/MYLANTA) 200-200-20 MG/5ML suspension 30 mL  30 mL Oral Q4H PRN Laveda Abbe, NP      . gabapentin (NEURONTIN) capsule 300 mg  300 mg Oral TID Micheal Likens, MD      . hydrOXYzine (ATARAX/VISTARIL) tablet 50 mg  50 mg Oral Q6H PRN Micheal Likens, MD   50 mg at 04/25/18 2159  . ibuprofen  (ADVIL,MOTRIN) tablet 600 mg  600 mg Oral Q6H PRN Micheal Likens, MD   600 mg at 04/25/18 2159  . magnesium hydroxide (MILK OF MAGNESIA) suspension 30 mL  30 mL Oral Daily PRN Laveda Abbe, NP      . OLANZapine [facility]) tablet 10 mg  10 mg Oral QHS Micheal Likens, MD   10 mg at 05/03/18 2115   Or  . OLANZapine (ZYPREXA) injection 10 mg  10 mg Intramuscular QHS Jaymeson Mengel T, MD      . OLANZapine zydis (ZYPREXA) 5 MG disintegrating tablet           . OLANZapine zydis (ZYPREXA) disintegrating tablet 5 mg  5 mg Oral Q8H PRN Micheal Likens, MD   5 mg at 05/04/18 1028   And  . ziprasidone (GEODON) injection 20 mg  20 mg Intramuscular PRN Jolyne Loa  T, MD      . ondansetron (ZOFRAN) tablet 4 mg  4 mg Oral Q8H PRN Micheal Likens, MD      . simethicone (MYLICON) chewable tablet 160 mg  160 mg Oral Q6H PRN Nira Conn A, NP      . traZODone (DESYREL) tablet 100 mg  100 mg Oral QHS PRN,MR X 1 Micheal Likens, MD   100 mg at 05/02/18 2121    Lab Results: No results found for this or any previous visit (from the past 48 hour(s)).  Blood Alcohol level:  Lab Results  Component Value Date   ETH <10 04/20/2018    Metabolic Disorder Labs: No results found for: HGBA1C, MPG No results found for: PROLACTIN No results found for: CHOL, TRIG, HDL, CHOLHDL, VLDL, LDLCALC  Physical Findings: AIMS: Facial and Oral Movements Muscles of Facial Expression: None, normal Lips and Perioral Area: None, normal Jaw: None, normal Tongue: None, normal,Extremity Movements Upper (arms, wrists, hands, fingers): None, normal Lower (legs, knees, ankles, toes): None, normal, Trunk Movements Neck, shoulders, hips: None, normal, Overall Severity Severity of abnormal movements (highest score from questions above): None, normal Incapacitation due to abnormal movements: None, normal Patient's awareness of abnormal movements (rate only patient's  report): No Awareness, Dental Status Current problems with teeth and/or dentures?: No Does patient usually wear dentures?: No  CIWA:  CIWA-Ar Total: 1 COWS:  COWS Total Score: 2  Musculoskeletal: Strength & Muscle Tone: within normal limits Gait & Station: normal Patient leans: N/A  Psychiatric Specialty Exam: Physical Exam  Nursing note and vitals reviewed.   Review of Systems  Constitutional: Negative for chills and fever.  Respiratory: Negative for cough and shortness of breath.   Cardiovascular: Negative for chest pain.  Gastrointestinal: Negative for abdominal pain, heartburn, nausea and vomiting.  Psychiatric/Behavioral: Negative for depression, hallucinations and suicidal ideas. The patient is nervous/anxious. The patient does not have insomnia.     Blood pressure 140/90, pulse (!) 110, temperature 98.4 F (36.9 C), temperature source Oral, resp. rate 16, height 5\' 8"  (1.727 m), weight 68 kg (150 lb).Body mass index is 22.81 kg/m.  General Appearance: Casual and Fairly Groomed  Eye Contact:  Fair  Speech:  Clear and Coherent and Normal Rate  Volume:  Normal  Mood:  Anxious and Irritable  Affect:  Blunt, Congruent and Constricted  Thought Process:  Coherent, Goal Directed and Descriptions of Associations: Loose  Orientation:  Full (Time, Place, and Person)  Thought Content:  Delusions and Hallucinations: Auditory  Suicidal Thoughts:  No  Homicidal Thoughts:  No  Memory:  Immediate;   Fair Recent;   Fair Remote;   Fair  Judgement:  Poor  Insight:  Lacking  Psychomotor Activity:  Normal  Concentration:  Concentration: Fair  Recall:  [employer] of Knowledge:  Fair  Language:  Fair  Akathisia:  No  Handed:    AIMS (if indicated):     Assets:  Resilience Social Support  ADL's:  Intact  Cognition:  WNL  Sleep:  Number of Hours: 6.75   Treatment Plan Summary: Daily contact with patient to assess and evaluate symptoms and progress in treatment and Medication  management    -Continue inpatient hospitalization  -Request for second opinion for forced medications from Dr. Jama Flavors   -Bipolar I, current episode manic with psychotic features             -Continue zyprexa 10mg  po/IM qhs  -Anxiety   -Continue vistaril 50mg  po q6h prn  anxiety   -Start gabapentin 300mg  po TID  -Agitation -Continue zydis 5mg  po q8h prn agitation/severe anxiety    -Continue geodon 20mg  IM q12h prn severe agitation and unable to take oral medication  -Insomnia  -Continuetrazodone 100mg  po qhs PRN isomnia (may repeat x1)  -Encourage participation in groups and therapeutic milieu  -Disposition planning will be ongoing   Micheal Likens, MD 05/04/2018, 3:24 PM

## 2018-05-04 NOTE — Progress Notes (Signed)
Recreation Therapy Notes  Date: 6.26.19 Time: 1000 Location: [address] Dayroom  Group Topic: Leisure Education  Goal Area(s) Addresses:  Patient will identify positive leisure activities.  Patient will identify one positive benefit of participation in leisure activities.   Intervention: To introduce patients to various leisure activities that can be used for entertainment, stress relief or coping skills.  Activity: Pictionary.  Patients were to select a slip of paper from the container.  Each slip of paper had an activity on it.  Patients were to draw the a picture of what was on the paper on the board.  The remaining patients were to guess what the picture was.  The person that guesses correctly, gets the next turn.  Education:  Leisure Education, Discharge Planning  Education Outcome: Acknowledges education/In group clarification offered/Needs additional education  Clinical Observations/Feedback: Pt did not attend group.     Venicia Vandall, LRT/CTRS         Lorne Winkels A 05/04/2018 11:53 AM 

## 2018-05-04 NOTE — Progress Notes (Signed)
D: Pt was in bed in his room upon initial approach.  Pt presents with anxious, depressed affect and mood.  He reports feeling anxious and was offered PRN medication for anxiety, which he declined because he reports it makes him more anxious.  His goal tonight is to "be safe."  Pt denies SI/HI, denies hallucinations, denies pain.  He endorses some paranoia, reporting he is "worried about others hurting me."  Pt has been isolative to his room for the majority of the night and he did not attend evening group.  A: Introduced self to pt.  Actively listened to pt and provided support and encouragement. Medication administered per order.  When pt was told it was time for HS medication, he initially stated "I'm good on that."  Pt was informed that the medication is ordered IM if PO is refused and he verbalized understanding and took medication.  Pt was encouraged to attend group.  Pt was provided with PO fluids and snacks tonight.  Q15 minute safety checks maintained.  R: Pt is safe on the unit.  Pt is compliant with medication.  Pt verbally contracts for safety.  Will continue to monitor and assess.

## 2018-05-04 NOTE — Plan of Care (Signed)
Maurice [name] continues to voice "severe anxiety" everyday.  He has not been attending groups or participating in unit activities.  He will not go to the cafeteria for meals and is unable to tolerate being around people that he doesn't know.  We will continue to monitor the progress towards his goals.

## 2018-05-04 NOTE — Progress Notes (Signed)
D:  [name] has been in his room all shift.  He did come out for snack and to take his HS medications.  He denied SI/HI or A/V hallucinations.  He continues to be very focused on his severe anxiety and difficulty sleeping.  He was noted being angry with his mother because she wouldn't take him home this evening and asked her to leave.  RN spoke with him about the Zyprexa ordered at hs and he was receptive  trying them in hopes that it will help him sleep and feel less anxious.  He is currently resting quietly with his eyes closed.  His mother called this evening to convey her concerns about needing forced medications but reassured her that he took the medication PO.  She again continues to voice her concerns about recent head injury and how he refused MRI prior to coming to the hospital.  She is hoping for him to get this MRI when he is discharged from the hospital and is requesting opinion from Dr. Altamese Carolinaainville about needing the MRI upon discharge.   A:  1:1 with RN for support and encouragement.  Medications as ordered.  Q 15 minute checks maintained for safety.  Encouraged participation in group and unit activities.   R:  Maurice [name] remains safe on the unit.  We will continue to monitor the progress towards his goals.

## 2018-05-05 NOTE — Progress Notes (Addendum)
Approached patient in dayroom to check in with him. Patient states, "I can't sleep in that room. He was standing over me. I'm scared to death. I've not had a roommate in years even without that happening with him. I will just have to stay up in the dayroom and not sleep." (Roommate was observed by this writer standing over patient encouraging him to pray.) Offered quiet room however patient stated, "without a window, I don't think I can." Patient noticeably distressed, slightly tremulous and wide-eyed. Discussed with Illene SilverBrook, AC and Irving BurtonEmily, Consulting civil engineercharge RN. Decision made to move patient to alternate room due to patient's distress, but also because patient's roommate has a do not admit order on his chart requiring a private room. Patient appreciative and presently resting in bed.

## 2018-05-05 NOTE — Progress Notes (Signed)
Patient denies SI, HI and AVH but reports increased anxiety.  Patient was offered anit-anxiety medications but stated "I don't want to take them because they make me feel like something is in my chest."  Patient has isolated to his room this shift and has not engaged in groups or unit activities.   Assess patient for safety, offer medications as prescribed, engage patient in 1:1 staff talks, encourage patient out of room.   Patient able to contract for safety.  Continue to monitor as prescribed   

## 2018-05-05 NOTE — Progress Notes (Signed)
Did not attended group 

## 2018-05-05 NOTE — Progress Notes (Signed)
D: Patient observed isolative to room much of the evening however is currently sitting in the dayroom. Patient remains guarded, cautious with some suspected paranoia. Patient's affect flat, anxious with congruent mood.  When asked about why he refused neurontin patient stated, "three times a day is too much. I was still feeling the effects of it so I refused." Patient only consented to midday dose. Denies pain, physical complaints.   A: Medicated per orders, no prns requested or required. Medication education provided, especially about the anti-anxiety effects of neurontin. Level III obs in place for safety. Emotional support offered. Encouraged to attend and participate in unit programming as well as being more visible in the milieu.     R: Patient verbalizes understanding of POC,. Patient denies SI/HI/AVH and remains safe on level III obs. Will continue to monitor throughout the night.

## 2018-05-05 NOTE — Progress Notes (Signed)
[facility] MD Progress Note  05/05/2018 1:00 PM Maurice [name]  MRN:  [id number] Subjective:    Maurice [name] is a 32 y/o M with history of treatment for depression, ADHD, and polysubstance abuse who was admitted from [facility] on IVC initiated by his sister due to worsening symptoms of psychosis and depression including reporting that he has been telepathically communicating with Tesla and Beverly Sessions as well reporting that they have been telling him to kill himself. Pt also reportedly asked his family for a gun. Pt has recent history of concussion about 2 weeks ago and he has continued to have vertigo from that incident (Head CT was negative for acute intracranial abnormality). Pt was medically stabilized and then transferred to [facility] for additional treatment and stabilization.He was started on trial of abilify and gabapentin, and he initially was refusing those medication. He barricaded himself in his room on 6/15, and he required IM medications to manage his agitation. Second opinion was obtained for forced medications; however, ptwasinitiallytaking abilify. Pt reported ongoing symptoms of AH, and he requested change of medications, and he was started on trial of Invega with potential plan to transition to long-acting injectable form; however, he has been declining to have long-acting injectable form. Pt reported some daytime sedation which was causing him to refuse oral form of Invega, and his dose was reduced. Pt had taken lower dose of Invega once, but then he began to refuse it again. Second opinion for forced medications was requested again. Pt was discontinued from [place], and he was started on trial of zyprexa.  Today upon interview,pt states, "I'm just a anxious still. I tried to go to lunch yesterday and it was too much with all the people." Pt describes having anxiety throughout the day without specific stressor. He denies other specific complaints. He is sleeping well. His appetite is good. He  continues to endorse telepathic communication and identifies that he has been communicating with his wife. Collateral information from pt's mother is that he is not married. Pt was asked more details about his marriage, and he reports that he has been married "not long" and his wife's name is "Maurice [name]." He denies SI/HI/VH. Discussed with patient about treatment options. He notes that zyprexa has been helpful for him with sleep. He also reports that gabapentin was somewhat helpful for his anxiety, and pt was encouraged to take all of his scheduled doses, as he had not yet received his AM dose at time of interview. Discussed with patient about importance of not isolating in his room to address his anxiety symptoms, and he was encouraged to participate in groups and the therapeutic milieu. Pt verbalized good understanding. He had no further questions, comments, or concerns.   Principal Problem: Bipolar I disorder, current or most recent episode manic, with psychotic features (HCC) Diagnosis:   Patient Active Problem List   Diagnosis Date Noted  . Acute psychosis (HCC) [F23]   . Bipolar I disorder, current or most recent episode manic, with psychotic features (HCC) [F31.2] 04/21/2018  . Suicidal ideations [R45.851]   . Polysubstance (including opioids) dependence with physiological dependence (HCC) [F19.20] 04/02/2018  . 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 Delusions (HCC) [F22]    Total Time spent with patient: 30 minutes  Past Psychiatric History: see H&P  Past Medical History:  Past Medical History:  Diagnosis Date  . Anxiety   . Substance abuse (HCC)    History reviewed. No pertinent surgical history. Family History: History reviewed. No  pertinent family history. Family Psychiatric  History: see H&P Social History:  Social History   Substance and Sexual Activity  Alcohol Use Not Currently  . Alcohol/week: 0.0 oz     Social History   Substance and  Sexual Activity  Drug Use No    Social History   Socioeconomic History  . Marital status: Single    Spouse name: Not on file  . Number of children: Not on file  . Years of education: Not on file  . Highest education level: Not on file  Occupational History  . Not on file  Social Needs  . Financial resource strain: Not on file  . Food insecurity:    Worry: Not on file    Inability: Not on file  . Transportation needs:    Medical: Not on file    Non-medical: Not on file  Tobacco Use  . Smoking status: Former Games developer  . Smokeless tobacco: Never Used  Substance and Sexual Activity  . Alcohol use: Not Currently    Alcohol/week: 0.0 oz  . Drug use: No  . Sexual activity: Not on file  Lifestyle  . Physical activity:    Days per week: Not on file    Minutes per session: Not on file  . Stress: Not on file  Relationships  . Social connections:    Talks on phone: Not on file    Gets together: Not on file    Attends religious service: Not on file    Active member of club or organization: Not on file    Attends meetings of clubs or organizations: Not on file    Relationship status: Not on file  Other Topics Concern  . Not on file  Social History Narrative  . Not on file   Additional Social History:                         Sleep: Poor  Appetite:  Good  Current Medications: Current Facility-Administered Medications  Medication Dose Route Frequency Provider Last Rate Last Dose  . acetaminophen (TYLENOL) tablet 650 mg  650 mg Oral Q6H PRN Laveda Abbe, NP      . alum & mag hydroxide-simeth (MAALOX/MYLANTA) 200-200-20 MG/5ML suspension 30 mL  30 mL Oral Q4H PRN Laveda Abbe, NP      . gabapentin (NEURONTIN) capsule 300 mg  300 mg Oral TID Micheal Likens, MD   300 mg at 05/04/18 1717  . hydrOXYzine (ATARAX/VISTARIL) tablet 50 mg  50 mg Oral Q6H PRN Micheal Likens, MD   50 mg at 04/25/18 2159  . ibuprofen (ADVIL,MOTRIN) tablet  600 mg  600 mg Oral Q6H PRN Micheal Likens, MD   600 mg at 04/25/18 2159  . magnesium hydroxide (MILK OF MAGNESIA) suspension 30 mL  30 mL Oral Daily PRN Laveda Abbe, NP      . OLANZapine [facility]) tablet 10 mg  10 mg Oral QHS Micheal Likens, MD   10 mg at 05/04/18 2059   Or  . OLANZapine (ZYPREXA) injection 10 mg  10 mg Intramuscular QHS Jolyne Loa T, MD      . OLANZapine zydis (ZYPREXA) disintegrating tablet 5 mg  5 mg Oral Q8H PRN Micheal Likens, MD   5 mg at 05/04/18 1028   And  . ziprasidone (GEODON) injection 20 mg  20 mg Intramuscular PRN Micheal Likens, MD      . ondansetron [facility]) tablet 4 mg  4 mg Oral Q8H PRN Micheal Likensainville, Benaiah Behan T, MD      . simethicone [facility](MYLICON) chewable tablet 160 mg  160 mg Oral Q6H PRN Nira ConnBerry, Jason A, NP      . traZODone (DESYREL) tablet 100 mg  100 mg Oral QHS PRN,MR X 1 Micheal Likensainville, Mackenzee Becvar T, MD   100 mg at 05/02/18 2121    Lab Results: No results found for this or any previous visit (from the past 48 hour(s)).  Blood Alcohol level:  Lab Results  Component Value Date   ETH <10 04/20/2018    Metabolic Disorder Labs: No results found for: HGBA1C, MPG No results found for: PROLACTIN No results found for: CHOL, TRIG, HDL, CHOLHDL, VLDL, LDLCALC  Physical Findings: AIMS: Facial and Oral Movements Muscles of Facial Expression: None, normal Lips and Perioral Area: None, normal Jaw: None, normal Tongue: None, normal,Extremity Movements Upper (arms, wrists, hands, fingers): None, normal Lower (legs, knees, ankles, toes): None, normal, Trunk Movements Neck, shoulders, hips: None, normal, Overall Severity Severity of abnormal movements (highest score from questions above): None, normal Incapacitation due to abnormal movements: None, normal Patient's awareness of abnormal movements (rate only patient's report): No Awareness, Dental Status Current problems with teeth and/or dentures?:  No Does patient usually wear dentures?: No  CIWA:  CIWA-Ar Total: 1 COWS:  COWS Total Score: 2  Musculoskeletal: Strength & Muscle Tone: within normal limits Gait & Station: normal Patient leans: N/A  Psychiatric Specialty Exam: Physical Exam  Nursing note and vitals reviewed.   Review of Systems  Constitutional: Negative for chills and fever.  Respiratory: Negative for cough and shortness of breath.   Cardiovascular: Negative for chest pain.  Gastrointestinal: Negative for abdominal pain, heartburn, nausea and vomiting.  Psychiatric/Behavioral: Negative for depression, hallucinations and suicidal ideas. The patient is not nervous/anxious and does not have insomnia.     Blood pressure (!) 152/95, pulse (!) 119, temperature 99.2 F (37.3 C), temperature source Oral, resp. rate 16, height 5\' 8"  (1.727 m), weight 68 kg (150 lb).Body mass index is 22.81 kg/m.  General Appearance: Casual and Fairly Groomed  Eye Contact:  Good  Speech:  Clear and Coherent and Normal Rate  Volume:  Normal  Mood:  Anxious and Depressed  Affect:  Blunt and Congruent  Thought Process:  Coherent  Orientation:  Full (Time, Place, and Person)  Thought Content:  Delusions and Hallucinations: Auditory  Suicidal Thoughts:  No  Homicidal Thoughts:  No  Memory:  Immediate;   Fair Recent;   Fair Remote;   Fair  Judgement:  Poor  Insight:  Lacking  Psychomotor Activity:  Normal  Concentration:  Concentration: Fair  Recall:  FiservFair  Fund of Knowledge:  Fair  Language:  Fair  Akathisia:  No  Handed:    AIMS (if indicated):     Assets:  Resilience Social Support  ADL's:  Intact  Cognition:  WNL  Sleep:  Number of Hours: 6.75    Treatment Plan Summary: Daily contact with patient to assess and evaluate symptoms and progress in treatment and Medication management   -Continue inpatient hospitalization  -Bipolar I, current episode manic with psychotic features -Continue zyprexa 10mg  po/IM  qhs  -Anxiety   -Continue vistaril 50mg  po q6h prn anxiety             -Continue gabapentin 300mg  po TID  -Agitation -Continue zydis 5mg  po q8h prn agitation/severe anxiety -Continue geodon 20mg  IM q12h prn severe agitation and unable to take oral medication  -Insomnia  -  Continuetrazodone 100mg  po qhs PRN isomnia (may repeat x1)  -Encourage participation in groups and therapeutic milieu  -Disposition planning will be ongoing  Micheal Likens, MD 05/05/2018, 1:00 PM

## 2018-05-05 NOTE — Progress Notes (Signed)
Recreation Therapy Notes  Date: 6.27.19 Time: 0945 Location: [address] Dayroom  Group Topic: Leisure Education  Goal Area(s) Addresses:  Patient will identify positive leisure activities.  Patient will identify one positive benefit of participation in leisure activities.   Intervention: Music  Activity: Exercise.  LRT and patients participated various exercises in the dayroom to get the heart rate up and get the blood flowing.  Education:  Leisure Education, Building control surveyorDischarge Planning  Education Outcome: Acknowledges education/In group clarification offered/Needs additional education  Clinical Observations/Feedback:  Pt did not attend group.    Caroll RancherMarjette Kendi Defalco, LRT/CTRS         Lillia AbedLindsay, Leif Loflin A 05/05/2018 11:35 AM

## 2018-05-05 NOTE — Plan of Care (Signed)
Patient remains flat, anxious, depressed and cautious. Family does state patient is making small progress.  Patient continues to remain isolative at times. States meals are hard for him to attend as he feels overwhelmed.

## 2018-05-06 NOTE — Progress Notes (Signed)
Pt refuse to take his gabapentin, refusing to get up for meals, reporting to his parents that we are not feeding him. Pt was offered to be taken to the cafeteria ahead of everybody so he can pick his own food and eat before other pt get to the cafeteria, but pt declined. Pt stated he is 32 yr old, he has set his life the the way he wants it and nobody is going to make him change it.

## 2018-05-06 NOTE — Tx Team (Signed)
Interdisciplinary Treatment and Diagnostic Plan Update  05/06/2018 Time of Session: 0932 SENDER Maurice [name] MRN: [id number]  Principal Diagnosis: Bipolar I disorder, current or most recent episode manic, with psychotic features ([place])  Secondary Diagnoses: Principal Problem:   Bipolar I disorder, current or most recent episode manic, with psychotic features ([place]) Active Problems:   Acute psychosis ([place])   Current Medications:  Current Facility-Administered Medications  Medication Dose Route Frequency Provider Last Rate Last Dose  . acetaminophen (TYLENOL) tablet 650 mg  650 mg Oral Q6H PRN Ethelene Hal, NP      . alum & mag hydroxide-simeth (MAALOX/MYLANTA) 200-200-20 MG/5ML suspension 30 mL  30 mL Oral Q4H PRN Ethelene Hal, NP      . gabapentin (NEURONTIN) capsule 300 mg  300 mg Oral TID Pennelope Bracken, MD   300 mg at 05/06/18 0759  . hydrOXYzine (ATARAX/VISTARIL) tablet 50 mg  50 mg Oral Q6H PRN Pennelope Bracken, MD   50 mg at 04/25/18 2159  . ibuprofen (ADVIL,MOTRIN) tablet 600 mg  600 mg Oral Q6H PRN Pennelope Bracken, MD   600 mg at 04/25/18 2159  . magnesium hydroxide (MILK OF MAGNESIA) suspension 30 mL  30 mL Oral Daily PRN Ethelene Hal, NP      . OLANZapine [facility]) tablet 10 mg  10 mg Oral QHS Pennelope Bracken, MD   10 mg at 05/05/18 2114  . OLANZapine zydis (ZYPREXA) disintegrating tablet 5 mg  5 mg Oral Q8H PRN Pennelope Bracken, MD   5 mg at 05/06/18 1030   And  . ziprasidone (GEODON) injection 20 mg  20 mg Intramuscular PRN Pennelope Bracken, MD      . ondansetron (ZOFRAN) tablet 4 mg  4 mg Oral Q8H PRN Pennelope Bracken, MD      . simethicone (MYLICON) chewable tablet 160 mg  160 mg Oral Q6H PRN Lindon Romp A, NP      . traZODone (DESYREL) tablet 100 mg  100 mg Oral QHS PRN,MR X 1 Pennelope Bracken, MD   100 mg at 05/02/18 2121    PTA Medications: Medications Prior to Admission   Medication Sig Dispense Refill Last Dose  . ALPRAZolam (XANAX) 1 MG tablet Take 1 mg by mouth daily as needed.  4 04/20/2018 at Unknown time  . amitriptyline (ELAVIL) 25 MG tablet Take 1 tablet (25 mg total) by mouth at bedtime. (Patient not taking: Reported on 04/20/2018) 10 tablet 0 Not Taking at Unknown time  . amphetamine-dextroamphetamine (ADDERALL) 20 MG tablet Take 20 mg by mouth daily.  0 Past Week at Unknown time  . Buprenorphine HCl-Naloxone HCl 8-2 MG FILM Place 1 tablet under the tongue daily.    04/01/2018 at Unknown time  . ibuprofen (ADVIL,MOTRIN) 800 MG tablet Take 1 tablet (800 mg total) by mouth 3 (three) times daily. 21 tablet 0 Past Week at Unknown time  . Ibuprofen-diphenhydrAMINE Cit (ADVIL PM) 200-38 MG TABS Take 2 tablets by mouth at bedtime as needed (pain).   Past Week at Unknown time  . meclizine (ANTIVERT) 25 MG tablet Take 1 tablet (25 mg total) by mouth 3 (three) times daily as needed for dizziness. 30 tablet 0 Past Week at Unknown time  . mometasone (NASONEX) 50 MCG/ACT nasal spray Place 2 sprays into the nose daily. (Patient not taking: Reported on 04/20/2018) 17 g 1 Not Taking at Unknown time  . ondansetron (ZOFRAN ODT) 4 MG disintegrating tablet Take 1 tablet (4 mg total)  by mouth every 8 (eight) hours as needed for nausea. (Patient not taking: Reported on 04/20/2018) 10 tablet 0 Not Taking at Unknown time  . promethazine (PHENERGAN) 25 MG tablet Take 1 tablet (25 mg total) by mouth every 8 (eight) hours as needed for nausea or vomiting. 20 tablet 0 Past Week at Unknown time    Patient Stressors: Medication change or noncompliance  Patient Strengths: Communication skills  Treatment Modalities: Medication Management, Group therapy, Case management,  1 to 1 session with clinician, Psychoeducation, Recreational therapy.   Physician Treatment Plan for Primary Diagnosis: Bipolar I disorder, current or most recent episode manic, with psychotic features ([place]) Long Term  Goal(s): Improvement in symptoms so as ready for discharge  Short Term Goals: Ability to identify and develop effective coping behaviors will improve Ability to identify changes in lifestyle to reduce recurrence of condition will improve  Medication Management: Evaluate patient's response, side effects, and tolerance of medication regimen.  Therapeutic Interventions: 1 to 1 sessions, Unit Group sessions and Medication administration.  Evaluation of Outcomes: Progressing   6/19: "There's something called the trickster that controls me and makes me say things. He repeats things over and over in my head." When asked to provide an example, pt describes the thoughts as derogatory and ego dystonic, and he provides example, "He says that me and my wife don't know each other." Discussed with patient about changing abilify to a different medication to address AH and paranoia, and he was in agreement to trial of [place]. Discussed with patient that likely recommendation if he has good tolerability and efficacy will be to transition to long-acting injectable form, and pt verbalized understanding.    Physician Treatment Plan for Secondary Diagnosis: Principal Problem:   Bipolar I disorder, current or most recent episode manic, with psychotic features ([place]) Active Problems:   Acute psychosis ([place])   Long Term Goal(s): Improvement in symptoms so as ready for discharge  Short Term Goals: Ability to identify and develop effective coping behaviors will improve Ability to identify changes in lifestyle to reduce recurrence of condition will improve  Medication Management: Evaluate patient's response, side effects, and tolerance of medication regimen.  Therapeutic Interventions: 1 to 1 sessions, Unit Group sessions and Medication administration.  Evaluation of Outcomes: Progressing   RN Treatment Plan for Primary Diagnosis: Bipolar I disorder, current or most recent episode manic, with psychotic features  ([place]) Long Term Goal(s): Knowledge of disease and therapeutic regimen to maintain health will improve  Short Term Goals: Ability to identify and develop effective coping behaviors will improve and Compliance with prescribed medications will improve  Medication Management: RN will administer medications as ordered by provider, will assess and evaluate patient's response and provide education to patient for prescribed medication. RN will report any adverse and/or side effects to prescribing provider.  Therapeutic Interventions: 1 on 1 counseling sessions, Psychoeducation, Medication administration, Evaluate responses to treatment, Monitor vital signs and CBGs as ordered, Perform/monitor CIWA, COWS, AIMS and Fall Risk screenings as ordered, Perform wound care treatments as ordered.  Evaluation of Outcomes: Progressing   LCSW Treatment Plan for Primary Diagnosis: Bipolar I disorder, current or most recent episode manic, with psychotic features ([place]) Long Term Goal(s): Safe transition to appropriate next level of care at discharge, Engage patient in therapeutic group addressing interpersonal concerns.  Short Term Goals: Engage patient in aftercare planning with referrals and resources  Therapeutic Interventions: Assess for all discharge needs, 1 to 1 time with Social worker, Explore available resources and support  systems, Assess for adequacy in community support network, Educate family and significant other(s) on suicide prevention, Complete Psychosocial Assessment, Interpersonal group therapy.  Evaluation of Outcomes: Met  Return home, follow up Dr [facility]   Progress in Treatment: Attending groups: No Participating in groups: No Taking medication as prescribed: Yes Toleration medication: Yes, no side effects reported at this time Family/Significant other contact made: Yes Patient understands diagnosis: No Limited insight Discussing patient identified problems/goals with staff: Yes Medical  problems stabilized or resolved: Yes Denies suicidal/homicidal ideation: Yes Issues/concerns per patient self-inventory: None Other: N/A  New problem(s) identified: None identified at this time.   New Short Term/Long Term Goal(s): "I'm just here to relax."  Discharge Plan or Barriers:   Reason for Continuation of Hospitalization:  Delusions   Medication stabilization   Estimated Length of Stay: 2-4 days  Attendees: Patient: 05/06/2018   Physician: Dr Nancy Fetter, MD 05/06/2018   Nursing: Eulogio Bear, RN 05/06/2018   RN Care Manager: 05/06/2018   Social Worker: Lurline Idol, LCSW 05/06/2018   Recreational Therapist:  05/06/2018   Other:  05/06/2018   Other:  05/06/2018   Other: 05/06/2018              Scribe for Treatment Team:  Lurline Idol, LCSW

## 2018-05-06 NOTE — Progress Notes (Signed)
The focus of this group is to help patients review their daily goal of treatment and discuss progress on daily workbooks. Pt did not attend the evening group, though he was invited.

## 2018-05-06 NOTE — Plan of Care (Signed)
Patient has not engaged in self harm. Denies SI, thoughts to do so.   Patient remains isolative and does not participate in milieu, treatment.

## 2018-05-06 NOTE — Progress Notes (Signed)
[facility].  He does seem better: no comments about telepathic communication.  He does say he is very anxious. Mom has concerns about long term effects of anti psychotics.  She would prefer to try his medication in pill form rather than going straight to the injection.  She also has concerns that Dr Evelene CroonKaur had put him on xanax and adderral due to the abuse potential.   Garner NashGregory Shizuye Rupert, MSW, LCSW Clinical Social Worker 05/06/2018 12:57 PM

## 2018-05-06 NOTE — Progress Notes (Signed)
D: Patient observed isolative to room. Upon approach, patient is observed pacing in room. Patient states, "I'm not okay. It's just family stuff. I'm trying not to share my thoughts. They are inappropriate and it's embarrassing." Did not want to discuss with this writer how family visitation went tonight. Patient would not engage with this Clinical research associatewriter and is paranoid. Avertive eye contact. Restless and resistant. Patient's affect worried, anxious and agitated with congruent mood.  Denies pain, physical complaints.   A: Medicated per orders, no prns requested or required. Medication education provided. Strongly encouraged to take neurontin during the day as his main complaint is anxiety. Level III obs in place for safety. Emotional support offered.  Encouraged to attend and participate in unit programming.    R: Patient verbalizes understanding of POC. He will not discuss his reasons for resistance to medications when he acknowledges considerable anxiety. Patient denies SI/HI/AVH and remains safe on level III obs. Will continue to monitor throughout the night.

## 2018-05-06 NOTE — Progress Notes (Signed)
[facility] MD Progress Note  05/06/2018 4:13 PM Maurice [name]  MRN:  [id number] Subjective:    Maurice [name] is a 32 y/o M with history of treatment for depression, ADHD, and polysubstance abuse who was admitted from [facility] on IVC initiated by his sister due to worsening symptoms of psychosis and depression including reporting that he has been telepathically communicating with Tesla and Beverly Sessions as well reporting that they have been telling him to kill himself. Pt also reportedly asked his family for a gun. Pt has recent history of concussion about 2 weeks ago and he has continued to have vertigo from that incident (Head CT was negative for acute intracranial abnormality). Pt was medically stabilized and then transferred to [facility] for additional treatment and stabilization.He was started on trial of abilify and gabapentin, and he initially was refusing those medication. He barricaded himself in his room on 6/15, and he required IM medications to manage his agitation. Second opinion was obtained for forced medications; however, ptwasinitiallytaking abilify. Pt reported ongoing symptoms of AH, and he requested change of medications, and he was started on trial of Invega with potential plan to transition to long-acting injectable form; however, he has been declining to have long-acting injectable form. Pt reported some daytime sedation which was causing him to refuse oral form of Invega, and his dose was reduced. Pthad taken lower dose of Invega once, but then he began to refuse it again. Pt was discontinued from Eva, and he was started on trial of zyprexa.  Today upon interview,pt states, "Don't even talk to my right now - my anxiety is out of control." Pt abruptly leaves the interview, and he refuses to participate further. He denies SI/HI/AH/VH, but he will not answer any further questions, even when attempting to re-engage patient later in the afternoon. Collateral information from pt's mother via SW  team suggests that patient is improving from perspective of his mother, so we will continue his current regimen without changes. RN staff will continue to encourage pt to take all prescribed medications including gabapentin to manage his anxiety.    Principal Problem: Bipolar I disorder, current or most recent episode manic, with psychotic features (HCC) Diagnosis:   Patient Active Problem List   Diagnosis Date Noted  . Acute psychosis (HCC) [F23]   . Bipolar I disorder, current or most recent episode manic, with psychotic features (HCC) [F31.2] 04/21/2018  . Suicidal ideations [R45.851]   . Polysubstance (including opioids) dependence with physiological dependence (HCC) [F19.20] 04/02/2018  . 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 Delusions (HCC) [F22]    Total Time spent with patient: 30 minutes  Past Psychiatric History: see H&P  Past Medical History:  Past Medical History:  Diagnosis Date  . Anxiety   . Substance abuse (HCC)    History reviewed. No pertinent surgical history. Family History: History reviewed. No pertinent family history. Family Psychiatric  History: see H&p Social History:  Social History   Substance and Sexual Activity  Alcohol Use Not Currently  . Alcohol/week: 0.0 oz     Social History   Substance and Sexual Activity  Drug Use No    Social History   Socioeconomic History  . Marital status: Single    Spouse name: Not on file  . Number of children: Not on file  . Years of education: Not on file  . Highest education level: Not on file  Occupational History  . Not on file  Social Needs  . Financial resource strain:  Not on file  . Food insecurity:    Worry: Not on file    Inability: Not on file  . Transportation needs:    Medical: Not on file    Non-medical: Not on file  Tobacco Use  . Smoking status: Former Games developermoker  . Smokeless tobacco: Never Used  Substance and Sexual Activity  . Alcohol use: Not Currently     Alcohol/week: 0.0 oz  . Drug use: No  . Sexual activity: Not on file  Lifestyle  . Physical activity:    Days per week: Not on file    Minutes per session: Not on file  . Stress: Not on file  Relationships  . Social connections:    Talks on phone: Not on file    Gets together: Not on file    Attends religious service: Not on file    Active member of club or organization: Not on file    Attends meetings of clubs or organizations: Not on file    Relationship status: Not on file  Other Topics Concern  . Not on file  Social History Narrative  . Not on file   Additional Social History:                         Sleep: Good  Appetite:  Good  Current Medications: Current Facility-Administered Medications  Medication Dose Route Frequency Provider Last Rate Last Dose  . acetaminophen (TYLENOL) tablet 650 mg  650 mg Oral Q6H PRN Laveda AbbeParks, Laurie Britton, NP      . alum & mag hydroxide-simeth (MAALOX/MYLANTA) 200-200-20 MG/5ML suspension 30 mL  30 mL Oral Q4H PRN Laveda AbbeParks, Laurie Britton, NP      . gabapentin (NEURONTIN) capsule 300 mg  300 mg Oral TID Micheal Likensainville, Rehmat Murtagh T, MD   300 mg at 05/06/18 0759  . hydrOXYzine (ATARAX/VISTARIL) tablet 50 mg  50 mg Oral Q6H PRN Micheal Likensainville, Cataleyah Colborn T, MD   50 mg at 04/25/18 2159  . ibuprofen (ADVIL,MOTRIN) tablet 600 mg  600 mg Oral Q6H PRN Micheal Likensainville, Mellonie Guess T, MD   600 mg at 04/25/18 2159  . magnesium hydroxide (MILK OF MAGNESIA) suspension 30 mL  30 mL Oral Daily PRN Laveda AbbeParks, Laurie Britton, NP      . OLANZapine [facility](ZYPREXA) tablet 10 mg  10 mg Oral QHS Micheal Likensainville, Rayan Ines T, MD   10 mg at 05/05/18 2114  . OLANZapine zydis (ZYPREXA) disintegrating tablet 5 mg  5 mg Oral Q8H PRN Micheal Likensainville, Sherin Murdoch T, MD   5 mg at 05/06/18 1030   And  . ziprasidone (GEODON) injection 20 mg  20 mg Intramuscular PRN Micheal Likensainville, Bryanne Riquelme T, MD      . ondansetron (ZOFRAN) tablet 4 mg  4 mg Oral Q8H PRN Micheal Likensainville, Nydia Ytuarte T, MD      . simethicone  (MYLICON) chewable tablet 160 mg  160 mg Oral Q6H PRN Nira ConnBerry, Jason A, NP      . traZODone (DESYREL) tablet 100 mg  100 mg Oral QHS PRN,MR X 1 Micheal Likensainville, Red Mandt T, MD   100 mg at 05/02/18 2121    Lab Results: No results found for this or any previous visit (from the past 48 hour(s)).  Blood Alcohol level:  Lab Results  Component Value Date   ETH <10 04/20/2018    Metabolic Disorder Labs: No results found for: HGBA1C, MPG No results found for: PROLACTIN No results found for: CHOL, TRIG, HDL, CHOLHDL, VLDL, LDLCALC  Physical Findings: AIMS: Facial and Oral  Movements Muscles of Facial Expression: None, normal Lips and Perioral Area: None, normal Jaw: None, normal Tongue: None, normal,Extremity Movements Upper (arms, wrists, hands, fingers): None, normal Lower (legs, knees, ankles, toes): None, normal, Trunk Movements Neck, shoulders, hips: None, normal, Overall Severity Severity of abnormal movements (highest score from questions above): None, normal Incapacitation due to abnormal movements: None, normal Patient's awareness of abnormal movements (rate only patient's report): No Awareness, Dental Status Current problems with teeth and/or dentures?: No Does patient usually wear dentures?: No  CIWA:  CIWA-Ar Total: 1 COWS:  COWS Total Score: 2  Musculoskeletal: Strength & Muscle Tone: within normal limits Gait & Station: normal Patient leans: N/A  Psychiatric Specialty Exam: Physical Exam  Nursing note and vitals reviewed.   Review of Systems  Unable to perform ROS: Psychiatric disorder    Blood pressure (!) 152/95, pulse (!) 119, temperature 99.2 F (37.3 C), temperature source Oral, resp. rate 16, height 5\' 8"  (1.727 m), weight 68 kg (150 lb).Body mass index is 22.81 kg/m.  General Appearance: Casual and Fairly Groomed  Eye Contact:  Good  Speech:  Clear and Coherent and Normal Rate  Volume:  Normal  Mood:  Anxious and Irritable  Affect:  Blunt and Congruent   Thought Process:  Coherent and Goal Directed  Orientation:  Full (Time, Place, and Person)  Thought Content:  Logical  Suicidal Thoughts:  No  Homicidal Thoughts:  No  Memory:  Immediate;   Fair Recent;   Fair Remote;   Fair  Judgement:  Poor  Insight:  Lacking  Psychomotor Activity:  Normal  Concentration:  Concentration: Fair  Recall:  [employer] of Knowledge:  Fair  Language:  Fair  Akathisia:  No  Handed:    AIMS (if indicated):     Assets:  Resilience Social Support  ADL's:  Intact  Cognition:  WNL  Sleep:  Number of Hours: 6.75   Treatment Plan Summary: Daily contact with patient to assess and evaluate symptoms and progress in treatment and Medication management   -Continue inpatient hospitalization  -Bipolar I, current episode manic with psychotic features -Continuezyprexa 10mg  poqhs  -Anxiety   -Continue vistaril 50mg  po q6h prn anxiety -Continue gabapentin 300mg  po TID  -Agitation -Continue zydis 5mg  po q8h prn agitation/severe anxiety -Continue geodon 20mg  IM q12h prn severe agitation and unable to take oral medication  -Insomnia  -Continuetrazodone 100mg  po qhs PRN isomnia (may repeat x1)  -Encourage participation in groups and therapeutic milieu  -Disposition planning will be ongoing    Micheal Likens, MD 05/06/2018, 4:13 PM

## 2018-05-06 NOTE — Progress Notes (Signed)
Pt moved from 501 -1 to 407-1. Pt complained of being anxious thereafter,   zyprexa 5 mp po given. Will continue to monitor.

## 2018-05-06 NOTE — Progress Notes (Signed)
Recreation Therapy Notes  Date: 6.28.19 Time: 0930 Location: 300 Hall Dayroom  Group Topic: Stress Management  Goal Area(s) Addresses:  Patient will verbalize importance of using healthy stress management.  Patient will identify positive emotions associated with healthy stress management.   Intervention: Stress Management  Activity :  [employer] Meditation.  LRT introduced the stress management technique of meditation.  LRT read a script on how mountains stay the same in spite of the things that go on around them.  Education:  Stress Management, Discharge Planning.   Education Outcome: Acknowledges edcuation/In group clarification offered/Needs additional education  Clinical Observations/Feedback: Pt did not attend group.    Caroll RancherMarjette Shemuel Harkleroad, LRT/CTRS         Caroll RancherLindsay, Ranada Vigorito A 05/06/2018 12:25 PM

## 2018-05-06 NOTE — Progress Notes (Signed)
DAR NOTE: Pt present with flat affect and depressed mood in the unit. Pt has been isolating himself and has been bed most of the time, pt encouraged to get out of bed attend groups and go for meals, pt stated he has social anxiety, pt also stated " I ma 32 yrs old, I have set my life and that's how I want it to be." Pt want his food to be brought to him in the room.  Pt denies physical pain, took all his meds as scheduled. As per self inventory, pt refused to fill one out. safety ensured with 15 minute and environmental checks. Pt currently denies SI/HI and A/V hallucinations. Pt verbally agrees to seek staff if SI/HI or A/VH occurs and to consult with staff before acting on these thoughts. Will continue POC.

## 2018-05-07 DIAGNOSIS — Z79899 Other long term (current) drug therapy: Secondary | ICD-10-CM

## 2018-05-07 MED ORDER — LORAZEPAM 0.5 MG PO TABS
0.5000 mg | ORAL_TABLET | Freq: Four times a day (QID) | ORAL | Status: DC | PRN
  Administered 2018-05-07 – 2018-05-08 (×4): 0.5 mg via ORAL
  Filled 2018-05-07 (×4): qty 1

## 2018-05-07 MED ORDER — TRAZODONE HCL 100 MG PO TABS
100.0000 mg | ORAL_TABLET | Freq: Every evening | ORAL | Status: DC | PRN
  Administered 2018-05-10 – 2018-05-14 (×5): 100 mg via ORAL
  Filled 2018-05-07 (×4): qty 1

## 2018-05-07 MED ORDER — HYDROXYZINE HCL 25 MG PO TABS: 25.0000 mg | ORAL_TABLET | Freq: Four times a day (QID) | ORAL | Status: DC | PRN

## 2018-05-07 MED ORDER — GABAPENTIN 400 MG PO CAPS
400.0000 mg | ORAL_CAPSULE | Freq: Three times a day (TID) | ORAL | Status: DC
  Administered 2018-05-07 – 2018-05-09 (×6): 400 mg via ORAL
  Filled 2018-05-07 (×8): qty 1

## 2018-05-07 NOTE — Progress Notes (Addendum)
D. Pt remains isolative to his room this am. Pt observed sitting on the floor beside his bed upon initial approach. Pt presents with anxious affect congruent with mood- mildly agitated, restless behavior, reporting that he is hungry, but isn't able to be around people in the cafeteria, stating,"I just can't do it". Pt given yogurt and trail mix  Pt currently denies SI/HI  and AV hallucinations, but endorses communicating with his 'wife' telepathically. A. Labs and vitals monitored. Pt compliant with his medications - Pt supported emotionally and encouraged  to come out of his room.  R. Pt remains safe with 15 minute checks. Will continue POC.

## 2018-05-07 NOTE — Progress Notes (Signed)
[facility] MD Progress Note  05/07/2018 11:43 AM Maurice [name]  MRN:  [id number] Subjective: Patient reports anxiety is his major issue/symptom.  Currently minimizes/denies depression.  Denies suicidal ideations.  Denies medication side effects. Objective: I have reviewed the chart notes and have met with patient. He is a 32 year old male, lives with his parents, has no children. Denies medical illnesses, has a history of concussion-a May 24 head CT scan negative for acute intracranial pathology. He was admitted on June 14 under  commitment initiated by family due to worsening psychosis/depression with delusions of telepathic communication with tesla and Iona Beard, reporting they were telling him to kill himself. At this time patient denies depression, denies suicidal ideations, presents vaguely anxious and endorses anxiety as his major symptom and states he feels he has a long history of social anxiety, due to which she prefers to keep to self.  Denies hallucinations, and does not currently present internally preoccupied, but becomes irritable, guarded when inquiring about hallucinations or concerns as above, stating " I do not want to talk about it". As per chart notes patient was initially managed with Invega but refused it due to side effects, he has been compliant with Zyprexa and Neurontin.  Denies side effects. As per chart notes, has been vaguely agitated/restless at times, limited participation in milieu.       Principal Problem: Bipolar I disorder, current or most recent episode manic, with psychotic features ([place]) Diagnosis:   Patient Active Problem List   Diagnosis Date Noted  . Acute psychosis ([place]) [F23]   . Bipolar I disorder, current or most recent episode manic, with psychotic features ([place]) [F31.2] 04/21/2018  . Suicidal ideations [R45.851]   . Polysubstance (including opioids) dependence with physiological dependence ([place]) [F19.20] 04/02/2018  . Substance-induced anxiety  disorder ([place]) [F19.980] 04/02/2018  . Delusions ([place]) [F22]    Total Time spent with patient: 20 minutes  Past Psychiatric History: see H&P  Past Medical History:  Past Medical History:  Diagnosis Date  . Anxiety   . Substance abuse ([place])    History reviewed. No pertinent surgical history. Family History: History reviewed. No pertinent family history. Family Psychiatric  History: see H&p Social History:  Social History   Substance and Sexual Activity  Alcohol Use Not Currently  . Alcohol/week: 0.0 oz     Social History   Substance and Sexual Activity  Drug Use No    Social History   Socioeconomic History  . Marital status: Single    Spouse name: Not on file  . Number of children: Not on file  . Years of education: Not on file  . Highest education level: Not on file  Occupational History  . Not on file  Social Needs  . Financial resource strain: Not on file  . Food insecurity:    Worry: Not on file    Inability: Not on file  . Transportation needs:    Medical: Not on file    Non-medical: Not on file  Tobacco Use  . Smoking status: Former Research scientist (life sciences)  . Smokeless tobacco: Never Used  Substance and Sexual Activity  . Alcohol use: Not Currently    Alcohol/week: 0.0 oz  . Drug use: No  . Sexual activity: Not on file  Lifestyle  . Physical activity:    Days per week: Not on file    Minutes per session: Not on file  . Stress: Not on file  Relationships  . Social connections:    Talks on phone: Not  on file    Gets together: Not on file    Attends religious service: Not on file    Active member of club or organization: Not on file    Attends meetings of clubs or organizations: Not on file    Relationship status: Not on file  Other Topics Concern  . Not on file  Social History Narrative  . Not on file   Additional Social History:   Sleep: Good  Appetite:  Good  Current Medications: Current Facility-Administered Medications  Medication Dose Route  Frequency Provider Last Rate Last Dose  . acetaminophen (TYLENOL) tablet 650 mg  650 mg Oral Q6H PRN Ethelene Hal, NP      . alum & mag hydroxide-simeth (MAALOX/MYLANTA) 200-200-20 MG/5ML suspension 30 mL  30 mL Oral Q4H PRN Ethelene Hal, NP      . gabapentin (NEURONTIN) capsule 300 mg  300 mg Oral TID Pennelope Bracken, MD   300 mg at 05/07/18 0836  . hydrOXYzine (ATARAX/VISTARIL) tablet 50 mg  50 mg Oral Q6H PRN Pennelope Bracken, MD   50 mg at 04/25/18 2159  . ibuprofen (ADVIL,MOTRIN) tablet 600 mg  600 mg Oral Q6H PRN Pennelope Bracken, MD   600 mg at 04/25/18 2159  . magnesium hydroxide (MILK OF MAGNESIA) suspension 30 mL  30 mL Oral Daily PRN Ethelene Hal, NP      . OLANZapine [facility]) tablet 10 mg  10 mg Oral QHS Pennelope Bracken, MD   10 mg at 05/06/18 2105  . OLANZapine zydis (ZYPREXA) disintegrating tablet 5 mg  5 mg Oral Q8H PRN Pennelope Bracken, MD   5 mg at 05/07/18 8250   And  . ziprasidone (GEODON) injection 20 mg  20 mg Intramuscular PRN Pennelope Bracken, MD      . ondansetron (ZOFRAN) tablet 4 mg  4 mg Oral Q8H PRN Pennelope Bracken, MD      . simethicone (MYLICON) chewable tablet 160 mg  160 mg Oral Q6H PRN Lindon Romp A, NP      . traZODone (DESYREL) tablet 100 mg  100 mg Oral QHS PRN,MR X 1 Pennelope Bracken, MD   100 mg at 05/02/18 2121    Lab Results: No results found for this or any previous visit (from the past 78 hour(s)).  Blood Alcohol level:  Lab Results  Component Value Date   ETH <10 53/97/6734    Metabolic Disorder Labs: No results found for: HGBA1C, MPG No results found for: PROLACTIN No results found for: CHOL, TRIG, HDL, CHOLHDL, VLDL, LDLCALC  Physical Findings: AIMS: Facial and Oral Movements Muscles of Facial Expression: None, normal Lips and Perioral Area: None, normal Jaw: None, normal Tongue: None, normal,Extremity Movements Upper (arms, wrists, hands,  fingers): None, normal Lower (legs, knees, ankles, toes): None, normal, Trunk Movements Neck, shoulders, hips: None, normal, Overall Severity Severity of abnormal movements (highest score from questions above): None, normal Incapacitation due to abnormal movements: None, normal Patient's awareness of abnormal movements (rate only patient's report): No Awareness, Dental Status Current problems with teeth and/or dentures?: No Does patient usually wear dentures?: No  CIWA:  CIWA-Ar Total: 1 COWS:  COWS Total Score: 2  Musculoskeletal: Strength & Muscle Tone: within normal limits Gait & Station: normal Patient leans: N/A  Psychiatric Specialty Exam: Physical Exam  Nursing note and vitals reviewed.   Review of Systems  Unable to perform ROS: Psychiatric disorder  Denies chest pain, no shortness of breath, no  vomiting  Blood pressure (!) 152/95, pulse (!) 119, temperature 99.2 F (37.3 C), temperature source Oral, resp. rate 16, height '5\' 8"'  (1.727 m), weight 68 kg (150 lb).Body mass index is 22.81 kg/m.  General Appearance: Fairly Groomed  Eye Contact:  Fair  Speech:  Normal Rate  Volume:  Normal  Mood:  Minimizes depression, presents anxious  Affect:  Anxious, restricted  Thought Process:  Linear and Descriptions of Associations: Intact  Orientation:  Other:  He is fully alert and attentive and oriented x3  Thought Content:  Denies hallucinations, no delusions expressed at this time, guarded  Suicidal Thoughts:  No-denies suicidal ideations, denies self-injurious ideations  Homicidal Thoughts:  No-denies homicidal ideations  Memory:  Recent and remote grossly intact  Judgement:  Fair  Insight:  Limited  Psychomotor Activity:  Normal  Concentration:  Concentration: Fair and Attention Span: Fair  Recall:  [employer] of Knowledge:  Fair  Language:  Fair  Akathisia:  No  Handed:    AIMS (if indicated):     Assets:  Resilience Social Support  ADL's:  Intact  Cognition:  WNL   Sleep:  Number of Hours: 6.75    Assessment -patient is a 32 year old male, has been diagnosed with Bipolar Disorder, reports a long history of anxiety.  Admitted under commitment due to worsening psychotic symptoms.  Patient endorses anxiety as his major symptom, and describes symptoms of social anxiety with increased anxiety in social situations or when feeling at the center of attention. He reports  some improvement, currently denies depression, does not express delusional ideations today but does appear guarded and vaguely agitated when writer inquires about symptoms. We discussed treatment options, including adding an antidepressant / SSRI to address anxiety symptoms, but states he is not currently interested.  Due to severity of anxiety patient may benefit from short-term/PRN benzodiazepine as needed.   Treatment Plan Summary: Daily contact with patient to assess and evaluate symptoms and progress in treatment and Medication management  Treatment plan reviewed as below today June 29 Encourage group and milieu participation to work on Radiographer, therapeutic and symptom reduction Continue Zyprexa 10 mg nightly for mood, psychotic symptoms, anxiety Increase Neurontin to 400 mg 3 times a day for anxiety Continue Trazodone 100 mg nightly PRN for insomnia as needed Start Ativan 0.5 mgrs Q 6 hours PRN for anxiety as needed Continue Agitation Protocol as needed -Continue zydis 64m po q8h prn agitation/severe anxiety -Continue geodon 221mIM q12h prn severe agitation and unable to take oral medication Treatment plan working on disposition plan Recheck BMP, CBC, check TSH, Lipid Panel, HgbA1C , Prolactin   FeJenne CampusMD 05/07/2018, 11:43 AM   Patient ID: BeYork Cerisemale   DOB: 7/May 19, [id number].   MRN: [id number]

## 2018-05-07 NOTE — Progress Notes (Signed)
D.  Pt in room on approach, pleasant but anxious.  Pt denies complaints at this time.  Pt did not attend evening wrap up group but did come up to get snacks later.  Pt's parents visited this evening and asked to speak to this staff member.  Parents really feel patient would benefit from going home and staying with them so that they can get him an MRI.  Parents feel that this change in his behavior is due to blow to head he experienced.  Father stated "please let the doctor know that will would love for him to discharge home tomorrow".  Pt denies SI/HI/AVH at this time.  A.  Support and encouragement offered, medication given as ordered. R.  Pt remains safe on unit, will continue to monitor.

## 2018-05-07 NOTE — Progress Notes (Signed)
Adult Psychoeducational Group Note  Date:  05/07/2018 Time:  11:17 AM  Group Topic/Focus:  Goals Group:   The focus of this group is to help patients establish daily goals to achieve during treatment and discuss how the patient can incorporate goal setting into their daily lives to aide in recovery.  Participation Level:  Did Not Attend  Additional Comments:  Pt declined attending the group complaining of having anxiety attacks.  Maurice [name], Maurice [name]  MHT/LRT/CTRS 05/07/2018, 11:17 AM

## 2018-05-07 NOTE — Progress Notes (Signed)
Patient's mother, Corrie DandyMary, called this afternoon inquiring about son. Mother reports that she is appreciative of the care he has received but feels that he would do better at home with them. Pt's mother states that she feels that pt "seems to be getting more stable" and would like for him to be discharged so that pt could "follow up with a neurologist".

## 2018-05-08 LAB — CBC WITH DIFFERENTIAL/PLATELET
BASOS ABS: 0 10*3/uL (ref 0.0–0.1)
Basophils Relative: 0 %
EOS PCT: 2 %
Eosinophils Absolute: 0.2 10*3/uL (ref 0.0–0.7)
HEMATOCRIT: 48.7 % (ref 39.0–52.0)
Hemoglobin: 16.7 g/dL (ref 13.0–17.0)
Lymphocytes Relative: 27 %
Lymphs Abs: 2.4 10*3/uL (ref 0.7–4.0)
MCH: 32.6 pg (ref 26.0–34.0)
MCHC: 34.3 g/dL (ref 30.0–36.0)
MCV: 94.9 fL (ref 78.0–100.0)
MONO ABS: 1.2 10*3/uL — AB (ref 0.1–1.0)
Monocytes Relative: 13 %
Neutro Abs: 5.2 10*3/uL (ref 1.7–7.7)
Neutrophils Relative %: 58 %
PLATELETS: 358 10*3/uL (ref 150–400)
RBC: 5.13 MIL/uL (ref 4.22–5.81)
RDW: 11.8 % (ref 11.5–15.5)
WBC: 8.9 10*3/uL (ref 4.0–10.5)

## 2018-05-08 LAB — LIPID PANEL
Cholesterol: 141 mg/dL (ref 0–200)
HDL: 40 mg/dL — AB (ref >40)
LDL CALC: 59 mg/dL (ref 0–99)
TRIGLYCERIDES: 210 mg/dL — AB (ref <150)
Total CHOL/HDL Ratio: 3.5 RATIO
VLDL: 42 mg/dL — AB (ref 0–40)

## 2018-05-08 LAB — HEMOGLOBIN A1C
Hgb A1c MFr Bld: 5.6 % (ref 4.8–5.6)
Mean Plasma Glucose: 114.02 mg/dL

## 2018-05-08 LAB — BASIC METABOLIC PANEL
ANION GAP: 10 (ref 5–15)
BUN: 13 mg/dL (ref 6–20)
CO2: 25 mmol/L (ref 22–32)
Calcium: 9.2 mg/dL (ref 8.9–10.3)
Chloride: 105 mmol/L (ref 98–111)
Creatinine, Ser: 0.95 mg/dL (ref 0.61–1.24)
GFR calc Af Amer: >60 mL/min (ref >60)
GFR calc non Af Amer: >60 mL/min (ref >60)
Glucose, Bld: 110 mg/dL — ABNORMAL HIGH (ref 70–99)
POTASSIUM: 3.8 mmol/L (ref 3.5–5.1)
Sodium: 140 mmol/L (ref 135–145)

## 2018-05-08 LAB — TSH: TSH: 2.916 u[IU]/mL (ref 0.350–4.500)

## 2018-05-08 MED ORDER — OLANZAPINE 7.5 MG PO TABS
15.0000 mg | ORAL_TABLET | Freq: Every day | ORAL | Status: DC
  Administered 2018-05-08 – 2018-05-09 (×2): 15 mg via ORAL
  Filled 2018-05-08 (×4): qty 2

## 2018-05-08 NOTE — [facility] Group Notes (Signed)
Adult Psychoeducational Group Note  Date:  05/08/2018 Time:  4:43 AM  Group Topic/Focus:  Wrap-Up Group:   The focus of this group is to help patients review their daily goal of treatment and discuss progress on daily workbooks.  Participation Level:  Did Not Attend  Participation Quality:  Did not attend  Affect:  did not attend  Cognitive:  did not attend  Insight: None  Engagement in Group:  patient did not attend  Modes of Intervention:  patient did not attend  Additional Comments:  N/A  Lyndee HensenGoins, Jaylianna Tatlock R 05/08/2018, 4:43 AM

## 2018-05-08 NOTE — [facility] Group Notes (Signed)
[facility] Group Notes:  (Nursing)  Date:  05/08/2018  Time:  1:30 PM  Type of Therapy:  Nurse Education  Participation Level:  Active  Participation Quality:  Attentive  Affect:  Appropriate  Cognitive:  Appropriate  Insight:  Appropriate  Engagement in Group:  Appropriate  Modes of Intervention:  Discussion and Education  Summary of Progress/Problems: Group played a non competitive learning/communication board game that fosters listening skills as well as self expression.  [name] Self 05/08/2018, 3:59 PM

## 2018-05-08 NOTE — [facility] Group Notes (Signed)
[facility] LCSW Group Therapy Note  Date/Time:  05/08/2018 10:00-11:00AM  Type of Therapy and Topic:  Group Therapy:  Healthy and Unhealthy Supports  Participation Level:  Did Not Attend   Description of Group:  Patients in this group were introduced to the idea of adding a variety of healthy supports to address the various needs in their lives.Patients discussed what additional healthy supports could be helpful in their recovery and wellness after discharge in order to prevent future hospitalizations.   An emphasis was placed on using counselor, doctor, therapy groups, 12-step groups, and problem-specific support groups to expand supports.  They also worked as a group on developing a specific plan for several patients to deal with unhealthy supports through boundary-setting, psychoeducation with loved ones, and even termination of relationships.   Therapeutic Goals:   1)  discuss importance of adding supports to stay well once out of the hospital  2)  compare healthy versus unhealthy supports and identify some examples of each  3)  generate ideas and descriptions of healthy supports that can be added  4)  offer mutual support about how to address unhealthy supports  5)  encourage active participation in and adherence to discharge plan    Summary of Patient Progress:   Did not attend.  Therapeutic Modalities:   Motivational Interviewing Brief Solution-Focused Therapy  Evorn Gongonnie D Joseangel Nettleton

## 2018-05-08 NOTE — Progress Notes (Signed)
[facility] MD Progress Note  05/08/2018 3:06 PM Maurice [name]  MRN:  [id number] Subjective: Patient states he feels better today, denies feeling depressed and states his mood is "okay".  Denies medication side effects, and states he feels medications, particular Neurontin, are helping.  Denies suicidal ideations. Objective: I have reviewed the chart notes and have met with patient. He is a 32 year old male, lives with his parents, has no children. Denies medical illnesses, has a history of concussion-a May 24 head CT scan negative for acute intracranial pathology. He was admitted on June 14 under  commitment initiated by family due to worsening psychosis/depression with delusions of telepathic communication with Tesla and Iona Beard, reporting they were telling him to kill himself. Currently patient describes feeling better, denies depression.  He denies any suicidal ideations, denies any homicidal ideations, denies hallucinations.  At this time presents with a partially improved eye contact, seems more communicative today, smiles briefly/appropriately during session. He remains vague regarding psychotic symptoms, states he does feel that some people can read his thoughts, but quickly becomes irritable when discussing these specific issues and states "it is private, I really do not want to talk about it".  He does not appear internally preoccupied. Denies medication side effects, currently on Zyprexa, Neurontin, Ativan PRNs for severe anxiety. Nursing notes indicate patient presents with some improvement, better mood, but still isolative which he attributes in part to social anxiety symptoms. 6/30 labs reviewed.       Principal Problem: Bipolar I disorder, current or most recent episode manic, with psychotic features ([place]) Diagnosis:   Patient Active Problem List   Diagnosis Date Noted  . Acute psychosis ([place]) [F23]   . Bipolar I disorder, current or most recent episode manic, with psychotic  features ([place]) [F31.2] 04/21/2018  . Suicidal ideations [R45.851]   . Polysubstance (including opioids) dependence with physiological dependence ([place]) [F19.20] 04/02/2018  . Substance-induced anxiety disorder ([place]) [F19.980] 04/02/2018  . Delusions ([place]) [F22]    Total Time spent with patient: 20 minutes  Past Psychiatric History: see H&P  Past Medical History:  Past Medical History:  Diagnosis Date  . Anxiety   . Substance abuse ([place])    History reviewed. No pertinent surgical history. Family History: History reviewed. No pertinent family history. Family Psychiatric  History: see H&p Social History:  Social History   Substance and Sexual Activity  Alcohol Use Not Currently  . Alcohol/week: 0.0 oz     Social History   Substance and Sexual Activity  Drug Use No    Social History   Socioeconomic History  . Marital status: Single    Spouse name: Not on file  . Number of children: Not on file  . Years of education: Not on file  . Highest education level: Not on file  Occupational History  . Not on file  Social Needs  . Financial resource strain: Not on file  . Food insecurity:    Worry: Not on file    Inability: Not on file  . Transportation needs:    Medical: Not on file    Non-medical: Not on file  Tobacco Use  . Smoking status: Former Research scientist (life sciences)  . Smokeless tobacco: Never Used  Substance and Sexual Activity  . Alcohol use: Not Currently    Alcohol/week: 0.0 oz  . Drug use: No  . Sexual activity: Not on file  Lifestyle  . Physical activity:    Days per week: Not on file    Minutes per session: Not  on file  . Stress: Not on file  Relationships  . Social connections:    Talks on phone: Not on file    Gets together: Not on file    Attends religious service: Not on file    Active member of club or organization: Not on file    Attends meetings of clubs or organizations: Not on file    Relationship status: Not on file  Other Topics Concern  . Not on file   Social History Narrative  . Not on file   Additional Social History:   Sleep: Good  Appetite:  Good  Current Medications: Current Facility-Administered Medications  Medication Dose Route Frequency Provider Last Rate Last Dose  . acetaminophen (TYLENOL) tablet 650 mg  650 mg Oral Q6H PRN Ethelene Hal, NP      . alum & mag hydroxide-simeth (MAALOX/MYLANTA) 200-200-20 MG/5ML suspension 30 mL  30 mL Oral Q4H PRN Ethelene Hal, NP      . gabapentin (NEURONTIN) capsule 400 mg  400 mg Oral TID Azana Kiesler, Myer Peer, MD   400 mg at 05/08/18 1123  . hydrOXYzine (ATARAX/VISTARIL) tablet 25 mg  25 mg Oral Q6H PRN Lavan Imes, Myer Peer, MD      . ibuprofen (ADVIL,MOTRIN) tablet 600 mg  600 mg Oral Q6H PRN Pennelope Bracken, MD   600 mg at 04/25/18 2159  . LORazepam (ATIVAN) tablet 0.5 mg  0.5 mg Oral Q6H PRN Amando Chaput, Myer Peer, MD   0.5 mg at 05/08/18 1342  . magnesium hydroxide (MILK OF MAGNESIA) suspension 30 mL  30 mL Oral Daily PRN Ethelene Hal, NP      . OLANZapine [facility]) tablet 10 mg  10 mg Oral QHS Pennelope Bracken, MD   10 mg at 05/07/18 2110  . OLANZapine zydis (ZYPREXA) disintegrating tablet 5 mg  5 mg Oral Q8H PRN Pennelope Bracken, MD   5 mg at 05/07/18 7824   And  . ziprasidone (GEODON) injection 20 mg  20 mg Intramuscular PRN Pennelope Bracken, MD      . ondansetron (ZOFRAN) tablet 4 mg  4 mg Oral Q8H PRN Pennelope Bracken, MD      . simethicone (MYLICON) chewable tablet 160 mg  160 mg Oral Q6H PRN Rozetta Nunnery, NP      . traZODone (DESYREL) tablet 100 mg  100 mg Oral QHS PRN Sharmarke Cicio, Myer Peer, MD        Lab Results:  Results for orders placed or performed during the hospital encounter of 04/21/18 (from the past 48 hour(s))  Hemoglobin A1c     Status: None   Collection Time: 05/08/18  6:24 AM  Result Value Ref Range   Hgb A1c MFr Bld 5.6 4.8 - 5.6 %    Comment: (NOTE) Pre diabetes:          5.7%-6.4% Diabetes:               >6.4% Glycemic control for   <7.0% adults with diabetes    Mean Plasma Glucose 114.02 mg/dL    Comment: Performed at [facility], [place] [address], [place], [place] [address]  Lipid panel     Status: Abnormal   Collection Time: 05/08/18  6:24 AM  Result Value Ref Range   Cholesterol 141 0 - 200 mg/dL   Triglycerides 210 (H) <150 mg/dL   HDL 40 (L) >40 mg/dL   Total CHOL/HDL Ratio 3.5 RATIO   VLDL 42 (H) 0 -  40 mg/dL   LDL Cholesterol 59 0 - 99 mg/dL    Comment:        Total Cholesterol/HDL:CHD Risk Coronary Heart Disease Risk Table                     Men   Women  1/2 Average Risk   3.4   3.3  Average Risk       5.0   4.4  2 X Average Risk   9.6   7.1  3 X Average Risk  23.4   11.0        Use the calculated Patient Ratio above and the CHD Risk Table to determine the patient's CHD Risk.        ATP III CLASSIFICATION (LDL):  <100     mg/dL   Optimal  100-129  mg/dL   Near or Above                    Optimal  130-159  mg/dL   Borderline  160-189  mg/dL   High  >190     mg/dL   Very High Performed at [place] [address] [place], [place] [address]   CBC with Differential/Platelet     Status: Abnormal   Collection Time: 05/08/18  6:24 AM  Result Value Ref Range   WBC 8.9 4.0 - 10.5 K/uL   RBC 5.13 4.22 - 5.81 MIL/uL   Hemoglobin 16.7 13.0 - 17.0 g/dL   HCT 48.7 39.0 - 52.0 %   MCV 94.9 78.0 - 100.0 fL   MCH 32.6 26.0 - 34.0 pg   MCHC 34.3 30.0 - 36.0 g/dL   RDW 11.8 11.5 - 15.5 %   Platelets 358 150 - 400 K/uL   Neutrophils Relative % 58 %   Neutro Abs 5.2 1.7 - 7.7 K/uL   Lymphocytes Relative 27 %   Lymphs Abs 2.4 0.7 - 4.0 K/uL   Monocytes Relative 13 %   Monocytes Absolute 1.2 (H) 0.1 - 1.0 K/uL   Eosinophils Relative 2 %   Eosinophils Absolute 0.2 0.0 - 0.7 K/uL   Basophils Relative 0 %   Basophils Absolute 0.0 0.0 - 0.1 K/uL    Comment: Performed at [facility] [address], [place] [address], [place], [place]  [address]  Basic metabolic panel     Status: Abnormal   Collection Time: 05/08/18  6:24 AM  Result Value Ref Range   Sodium 140 135 - 145 mmol/L   Potassium 3.8 3.5 - 5.1 mmol/L   Chloride 105 98 - 111 mmol/L    Comment: Please note change in reference range.   CO2 25 22 - 32 mmol/L   Glucose, Bld 110 (H) 70 - 99 mg/dL    Comment: Please note change in reference range.   BUN 13 6 - 20 mg/dL    Comment: Please note change in reference range.   Creatinine, Ser 0.95 0.61 - 1.24 mg/dL   Calcium 9.2 8.9 - 10.3 mg/dL   GFR calc non Af Amer >60 >60 mL/min   GFR calc Af Amer >60 >60 mL/min    Comment: (NOTE) The eGFR has been calculated using the CKD EPI equation. This calculation has not been validated in all clinical situations. eGFR's persistently <60 mL/min signify possible Chronic Kidney Disease.    Anion gap 10 5 - 15    Comment: Performed at [facility], [place] [address], [place], [place] [address]  TSH     Status: None   Collection Time: 05/08/18  6:24 AM  Result Value Ref Range   TSH 2.916 0.350 - 4.500 uIU/mL    Comment: Performed by a 3rd Generation assay with a functional sensitivity of <=0.01 uIU/mL. Performed at [facility], [place] [address] [place], [place] [address]     Blood Alcohol level:  Lab Results  Component Value Date   ETH <10 30/16/0109    Metabolic Disorder Labs: Lab Results  Component Value Date   HGBA1C 5.6 05/08/2018   MPG 114.02 05/08/2018   No results found for: PROLACTIN Lab Results  Component Value Date   CHOL 141 05/08/2018   TRIG 210 (H) 05/08/2018   HDL 40 (L) 05/08/2018   CHOLHDL 3.5 05/08/2018   VLDL 42 (H) 05/08/2018   LDLCALC 59 05/08/2018    Physical Findings: AIMS: Facial and Oral Movements Muscles of Facial Expression: None, normal Lips and Perioral Area: None, normal Jaw: None, normal Tongue: None, normal,Extremity Movements Upper (arms, wrists, hands, fingers): None, normal Lower  (legs, knees, ankles, toes): None, normal, Trunk Movements Neck, shoulders, hips: None, normal, Overall Severity Severity of abnormal movements (highest score from questions above): None, normal Incapacitation due to abnormal movements: None, normal Patient's awareness of abnormal movements (rate only patient's report): No Awareness, Dental Status Current problems with teeth and/or dentures?: No Does patient usually wear dentures?: No  CIWA:  CIWA-Ar Total: 1 COWS:  COWS Total Score: 2  Musculoskeletal: Strength & Muscle Tone: within normal limits Gait & Station: normal Patient leans: N/A  Psychiatric Specialty Exam: Physical Exam  Nursing note and vitals reviewed.   Review of Systems  Unable to perform ROS: Psychiatric disorder  Denies chest pain, no shortness of breath, no vomiting  Blood pressure 139/86, pulse 97, temperature 99.2 F (37.3 C), temperature source Oral, resp. rate 20, height '5\' 8"'  (1.727 m), weight 68 kg (150 lb).Body mass index is 22.81 kg/m.  General Appearance: Improving grooming  Eye Contact:  Fair, but does make some eye contact today  Speech:  Normal Rate  Volume:  Normal  Mood:  States mood is "okay" and denies feeling depressed  Affect:  Remains restricted, anxious, but somewhat more reactive today  Thought Process:  Linear and Descriptions of Associations: Intact  Orientation:  Other:  He is fully alert and attentive and oriented x3  Thought Content:  Denies hallucinations and does not appear internally preoccupied, does not elaborate on delusional ideations but does endorse feeling others can read his thoughts/broadcasting.  Suicidal Thoughts:  No-denies suicidal ideations, denies self-injurious ideations  Homicidal Thoughts:  No-denies homicidal ideations  Memory:  Recent and remote grossly intact  Judgement:  Fair  Insight:  Limited  Psychomotor Activity:  Decreased  Concentration:  Concentration: Fair and Attention Span: Fair  Recall:  [employer] of Knowledge:  Fair  Language:  Fair  Akathisia:  No  Handed:    AIMS (if indicated):     Assets:  Resilience Social Support  ADL's:  Intact  Cognition:  WNL  Sleep:  Number of Hours: 6.75    Assessment -patient is endorsing feeling better and currently denies significant depression or neurovegetative symptoms, denies suicidal ideations.  Acknowledges thoughts that others can read his thoughts, but does not mention or elaborate telepathy focused delusions.  Generally cooperative, polite, but becomes irritable when discussing/exploring psychotic symptoms.  Thus far tolerating Zyprexa/Neurontin well.  He describes anxiety as his major issue and feels that it  has abated partially with medications.  Treatment Plan Summary: Daily contact with patient to assess and evaluate symptoms and progress in treatment and Medication management  Treatment plan reviewed as below today June 30 Encourage group and milieu participation to work on Radiographer, therapeutic and symptom reduction Increase  Zyprexa to 15  mg nightly for mood, psychotic symptoms, anxiety Continue Neurontin  400 mg 3 times a day for anxiety Continue Trazodone 100 mg nightly PRN for insomnia as needed Start Ativan 0.5 mgrs Q 6 hours PRN for anxiety as needed Continue Agitation Protocol as needed -Continue zydis 34m po q8h prn agitation/severe anxiety -Continue geodon 263mIM q12h prn severe agitation and unable to take oral medication Treatment plan working on disposition plan    FeJenne CampusMD 05/08/2018, 3:06 PM   Patient ID: BeYork Cerisemale   DOB: [birth year].   MRN: [id number]

## 2018-05-08 NOTE — Progress Notes (Signed)
D. Pt has been calm and cooperative- mood appears to be improving, but remains isolative to his room for much of the morning- coming out for breakfast and meds. Pt has several books in his room, and when asked if he likes to read, pt responds, "I can't because I read aloud and I'm embarrassed."  Pt reports that his anxiety is improving and that the gabapentin is helping. Pt went to cafeteria for breakfast and has agreed to go for lunch. Per pt's self inventory, pt rates his depression, hopelessness and anxiety a 0/5/5, respectively. Pt states that his goal today is,"to get help with my telepathy with tourettes- if that's a thing".  Pt currently denies SI/HI and AV hallucinations A. Labs and vitals monitored. Pt compliant with medications. Pt supported emotionally and encouraged to express concerns and ask questions.   R. Pt remains safe with 15 minute checks. Will continue POC

## 2018-05-09 LAB — PROLACTIN: Prolactin: 43.7 ng/mL — ABNORMAL HIGH (ref 4.0–15.2)

## 2018-05-09 MED ORDER — GABAPENTIN 300 MG PO CAPS
600.0000 mg | ORAL_CAPSULE | Freq: Three times a day (TID) | ORAL | Status: DC
  Administered 2018-05-09 – 2018-05-11 (×7): 600 mg via ORAL
  Filled 2018-05-09 (×14): qty 2

## 2018-05-09 MED ORDER — HYDROXYZINE HCL 25 MG PO TABS
25.0000 mg | ORAL_TABLET | Freq: Four times a day (QID) | ORAL | Status: DC | PRN
  Filled 2018-05-09 (×3): qty 1

## 2018-05-09 MED ORDER — LORAZEPAM 0.5 MG PO TABS
0.5000 mg | ORAL_TABLET | Freq: Four times a day (QID) | ORAL | Status: DC | PRN
  Administered 2018-05-09 – 2018-05-16 (×9): 0.5 mg via ORAL
  Filled 2018-05-09 (×8): qty 1

## 2018-05-09 NOTE — Progress Notes (Signed)
Recreation Therapy Notes  Date: 7.1.19 Time: 0930 Location: [address] Dayroom  Group Topic: Stress Management  Goal Area(s) Addresses:  Patient will verbalize importance of using healthy stress management.  Patient will identify positive emotions associated with healthy stress management.   Intervention: Stress Management  Activity :  Progressive Muscle Relaxation.  LRT introduced the stress management technique of progressive muscle relaxation.  LRT read script to guide patients through tensing and relaxing each muscle individually.  Patients were to follow along as LRT lead them through the exercise.  Education:  Stress Management, Discharge Planning.   Education Outcome: Acknowledges edcuation/In group clarification offered/Needs additional education  Clinical Observations/Feedback: Pt did not attend group.    Caroll RancherMarjette Mckensi Redinger, LRT/CTRS         Caroll RancherLindsay, Anaelle Dunton A 05/09/2018 11:48 AM

## 2018-05-09 NOTE — Plan of Care (Signed)
  Problem: Safety: Goal: Periods of time without injury will increase Outcome: Progressing   Problem: Health Behavior/Discharge Planning: Goal: Compliance with prescribed medication regimen will improve Outcome: Progressing   Problem: Self-Concept: Goal: Ability to disclose and discuss suicidal ideas will improve Outcome: Progressing  DAR NOTE: Patient presents with anxious affect and depressed mood.  Denies suicidal thoughts, pain, auditory and visual hallucinations.  Rates depression at 0, hopelessness at 0, and anxiety at 0.  Maintained on routine safety checks.  Medications given as prescribed.  Support and encouragement offered as needed.  Attended group and participated.  Patient remained in his room majority of this shift.  Offered no complaint.

## 2018-05-09 NOTE — Progress Notes (Signed)
CSW returned call to pt mother, Alfredia ClientMary Jo, who CSW had spoken to last week about concerns with injectable medications.  CSW updated her on current oral medication.  She is anxious for discharge, saw pt over the weekend and felt like he continues to be improved. Garner NashGregory Samson Ralph, MSW, LCSW Clinical Social Worker 05/09/2018 10:07 AM

## 2018-05-09 NOTE — Progress Notes (Signed)
Writer spoke with patient briefly at medication window. He has been isolative to his room and did not attend group. He was compliant with his medication and requested invega which was not ordered. He appeared anxious and rushed to answer any questions asked by Clinical research associatewriter. He was given ativan along with  gatorade with medications and encouraged to keep himself hydrated. Support given and safety maintained on unit with 15 min checks.

## 2018-05-09 NOTE — Progress Notes (Signed)
D.  Pt pleasant but anxious on approach, did not get up for evening wrap up group.  Pt has remained in room in bed this shift.  Pt denies SI/HI/AVH at this time.  Pt anxious for discharge.  A.  Support and encouragement offered, medication given as ordered  R.  Pt remains safe on the unit, will continue to monitor.

## 2018-05-09 NOTE — Progress Notes (Signed)
[facility] MD Progress Note  05/09/2018 12:16 PM Maurice [name]  MRN:  [id number] Subjective: Patient is seen and examined.  Patient is a 32 year old male with a past psychiatric history of a traumatic brain injury, delusions, hallucinations.  He is seen in follow-up.  This morning he did not want to get out of bed and I gave him some time.  I was able to talk to him on his way to lunch.  He stated that he is been having anxiety and panic attacks for 2 weeks.  I asked him why this is been going on for 2 weeks given he been in the hospital for 18 days.  He stated that he did not want to talk about it "my family does not believe that my music is famous".  He then left the room and went back to his hospital room.  He is currently ordered gabapentin 400 mg p.o. 3 times daily, hydroxyzine 25 mg every 6 hours as needed, lorazepam 0.5 mg p.o. every 6 hours as needed, Zyprexa 15 mg p.o. nightly and trazodone 100 mg p.o. nightly, but checking with nursing it appears as though he is only taking the Neurontin over the last week or so. Principal Problem: Bipolar I disorder, current or most recent episode manic, with psychotic features ([place]) Diagnosis:   Patient Active Problem List   Diagnosis Date Noted  . Acute psychosis ([place]) [F23]   . Bipolar I disorder, current or most recent episode manic, with psychotic features ([place]) [F31.2] 04/21/2018  . Suicidal ideations [R45.851]   . Polysubstance (including opioids) dependence with physiological dependence ([place]) [F19.20] 04/02/2018  . Substance-induced anxiety disorder ([place]) [F19.980] 04/02/2018  . Delusions ([place]) [F22]    Total Time spent with patient: 20 minutes  Past Psychiatric History: See admission H&P  Past Medical History:  Past Medical History:  Diagnosis Date  . Anxiety   . Substance abuse ([place])    History reviewed. No pertinent surgical history. Family History: History reviewed. No pertinent family history. Family Psychiatric  History: See admission  H&P Social History:  Social History   Substance and Sexual Activity  Alcohol Use Not Currently  . Alcohol/week: 0.0 oz     Social History   Substance and Sexual Activity  Drug Use No    Social History   Socioeconomic History  . Marital status: Single    Spouse name: Not on file  . Number of children: Not on file  . Years of education: Not on file  . Highest education level: Not on file  Occupational History  . Not on file  Social Needs  . Financial resource strain: Not on file  . Food insecurity:    Worry: Not on file    Inability: Not on file  . Transportation needs:    Medical: Not on file    Non-medical: Not on file  Tobacco Use  . Smoking status: Former Research scientist (life sciences)  . Smokeless tobacco: Never Used  Substance and Sexual Activity  . Alcohol use: Not Currently    Alcohol/week: 0.0 oz  . Drug use: No  . Sexual activity: Not on file  Lifestyle  . Physical activity:    Days per week: Not on file    Minutes per session: Not on file  . Stress: Not on file  Relationships  . Social connections:    Talks on phone: Not on file    Gets together: Not on file    Attends religious service: Not on file    Active  member of club or organization: Not on file    Attends meetings of clubs or organizations: Not on file    Relationship status: Not on file  Other Topics Concern  . Not on file  Social History Narrative  . Not on file   Additional Social History:                         Sleep: Fair  Appetite:  Fair  Current Medications: Current Facility-Administered Medications  Medication Dose Route Frequency Provider Last Rate Last Dose  . acetaminophen (TYLENOL) tablet 650 mg  650 mg Oral Q6H PRN Ethelene Hal, NP      . alum & mag hydroxide-simeth (MAALOX/MYLANTA) 200-200-20 MG/5ML suspension 30 mL  30 mL Oral Q4H PRN Ethelene Hal, NP      . gabapentin (NEURONTIN) capsule 400 mg  400 mg Oral TID Cobos, Myer Peer, MD   400 mg at 05/09/18 0853   . hydrOXYzine (ATARAX/VISTARIL) tablet 25 mg  25 mg Oral Q6H PRN Pennelope Bracken, MD      . ibuprofen (ADVIL,MOTRIN) tablet 600 mg  600 mg Oral Q6H PRN Pennelope Bracken, MD   600 mg at 04/25/18 2159  . LORazepam (ATIVAN) tablet 0.5 mg  0.5 mg Oral Q6H PRN Pennelope Bracken, MD      . magnesium hydroxide (MILK OF MAGNESIA) suspension 30 mL  30 mL Oral Daily PRN Ethelene Hal, NP      . OLANZapine [facility]) tablet 15 mg  15 mg Oral QHS Cobos, Myer Peer, MD   15 mg at 05/08/18 2129  . OLANZapine zydis (ZYPREXA) disintegrating tablet 5 mg  5 mg Oral Q8H PRN Pennelope Bracken, MD   5 mg at 05/07/18 4008   And  . ziprasidone (GEODON) injection 20 mg  20 mg Intramuscular PRN Pennelope Bracken, MD      . ondansetron (ZOFRAN) tablet 4 mg  4 mg Oral Q8H PRN Pennelope Bracken, MD      . simethicone (MYLICON) chewable tablet 160 mg  160 mg Oral Q6H PRN Lindon Romp A, NP      . traZODone (DESYREL) tablet 100 mg  100 mg Oral QHS PRN Cobos, Myer Peer, MD        Lab Results:  Results for orders placed or performed during the hospital encounter of 04/21/18 (from the past 48 hour(s))  Prolactin     Status: Abnormal   Collection Time: 05/07/18  6:40 PM  Result Value Ref Range   Prolactin 43.7 (H) 4.0 - 15.2 ng/mL    Comment: (NOTE) Performed At: [facility] [place], [place] [id number] Rush Farmer MD OI:[id number] Performed at [facility], [place] [address] [place], [place] [address]   Hemoglobin A1c     Status: None   Collection Time: 05/08/18  6:24 AM  Result Value Ref Range   Hgb A1c MFr Bld 5.6 4.8 - 5.6 %    Comment: (NOTE) Pre diabetes:          5.7%-6.4% Diabetes:              >6.4% Glycemic control for   <7.0% adults with diabetes    Mean Plasma Glucose 114.02 mg/dL    Comment: Performed at [facility] [address], [place], [place] [address]  Lipid panel     Status: Abnormal    Collection Time: 05/08/18  6:24 AM  Result  Value Ref Range   Cholesterol 141 0 - 200 mg/dL   Triglycerides 210 (H) <150 mg/dL   HDL 40 (L) >40 mg/dL   Total CHOL/HDL Ratio 3.5 RATIO   VLDL 42 (H) 0 - 40 mg/dL   LDL Cholesterol 59 0 - 99 mg/dL    Comment:        Total Cholesterol/HDL:CHD Risk Coronary Heart Disease Risk Table                     Men   Women  1/2 Average Risk   3.4   3.3  Average Risk       5.0   4.4  2 X Average Risk   9.6   7.1  3 X Average Risk  23.4   11.0        Use the calculated Patient Ratio above and the CHD Risk Table to determine the patient's CHD Risk.        ATP III CLASSIFICATION (LDL):  <100     mg/dL   Optimal  100-129  mg/dL   Near or Above                    Optimal  130-159  mg/dL   Borderline  160-189  mg/dL   High  >190     mg/dL   Very High Performed at [facility] [address] [place], [place] [address]   CBC with Differential/Platelet     Status: Abnormal   Collection Time: 05/08/18  6:24 AM  Result Value Ref Range   WBC 8.9 4.0 - 10.5 K/uL   RBC 5.13 4.22 - 5.81 MIL/uL   Hemoglobin 16.7 13.0 - 17.0 g/dL   HCT 48.7 39.0 - 52.0 %   MCV 94.9 78.0 - 100.0 fL   MCH 32.6 26.0 - 34.0 pg   MCHC 34.3 30.0 - 36.0 g/dL   RDW 11.8 11.5 - 15.5 %   Platelets 358 150 - 400 K/uL   Neutrophils Relative % 58 %   Neutro Abs 5.2 1.7 - 7.7 K/uL   Lymphocytes Relative 27 %   Lymphs Abs 2.4 0.7 - 4.0 K/uL   Monocytes Relative 13 %   Monocytes Absolute 1.2 (H) 0.1 - 1.0 K/uL   Eosinophils Relative 2 %   Eosinophils Absolute 0.2 0.0 - 0.7 K/uL   Basophils Relative 0 %   Basophils Absolute 0.0 0.0 - 0.1 K/uL    Comment: Performed at [facility], [place] [address] [place], [place] [address]  Basic metabolic panel     Status: Abnormal   Collection Time: 05/08/18  6:24 AM  Result Value Ref Range   Sodium 140 135 - 145 mmol/L   Potassium 3.8 3.5 - 5.1 mmol/L   Chloride 105 98 - 111 mmol/L    Comment: Please  note change in reference range.   CO2 25 22 - 32 mmol/L   Glucose, Bld 110 (H) 70 - 99 mg/dL    Comment: Please note change in reference range.   BUN 13 6 - 20 mg/dL    Comment: Please note change in reference range.   Creatinine, Ser 0.95 0.61 - 1.24 mg/dL   Calcium 9.2 8.9 - 10.3 mg/dL   GFR calc non Af Amer >60 >60 mL/min   GFR calc Af Amer >60 >60 mL/min    Comment: (NOTE) The eGFR has been calculated using the CKD EPI equation. This calculation has not been validated  in all clinical situations. eGFR's persistently <60 mL/min signify possible Chronic Kidney Disease.    Anion gap 10 5 - 15    Comment: Performed at [facility], [place] [address], [place], [place] [address]  TSH     Status: None   Collection Time: 05/08/18  6:24 AM  Result Value Ref Range   TSH 2.916 0.350 - 4.500 uIU/mL    Comment: Performed by a 3rd Generation assay with a functional sensitivity of <=0.01 uIU/mL. Performed at [facility], [place] [address] [place], [place] [address]     Blood Alcohol level:  Lab Results  Component Value Date   ETH <10 42/59/5638    Metabolic Disorder Labs: Lab Results  Component Value Date   HGBA1C 5.6 05/08/2018   MPG 114.02 05/08/2018   Lab Results  Component Value Date   PROLACTIN 43.7 (H) 05/07/2018   Lab Results  Component Value Date   CHOL 141 05/08/2018   TRIG 210 (H) 05/08/2018   HDL 40 (L) 05/08/2018   CHOLHDL 3.5 05/08/2018   VLDL 42 (H) 05/08/2018   LDLCALC 59 05/08/2018    Physical Findings: AIMS: Facial and Oral Movements Muscles of Facial Expression: None, normal Lips and Perioral Area: None, normal Jaw: None, normal Tongue: None, normal,Extremity Movements Upper (arms, wrists, hands, fingers): None, normal Lower (legs, knees, ankles, toes): None, normal, Trunk Movements Neck, shoulders, hips: None, normal, Overall Severity Severity of abnormal movements (highest score from questions above): None,  normal Incapacitation due to abnormal movements: None, normal Patient's awareness of abnormal movements (rate only patient's report): No Awareness, Dental Status Current problems with teeth and/or dentures?: No Does patient usually wear dentures?: No  CIWA:  CIWA-Ar Total: 1 COWS:  COWS Total Score: 2  Musculoskeletal: Strength & Muscle Tone: within normal limits Gait & Station: normal Patient leans: N/A  Psychiatric Specialty Exam: Physical Exam  Nursing note and vitals reviewed. Constitutional: He is oriented to person, place, and time. He appears well-developed and well-nourished.  HENT:  Head: Normocephalic and atraumatic.  Respiratory: Effort normal.  Neurological: He is alert and oriented to person, place, and time.    ROS  Blood pressure (!) 129/93, pulse (!) 110, temperature 99.2 F (37.3 C), temperature source Oral, resp. rate 20, height _0  (1.727 m), weight 68 kg (150 lb).Body mass index is 22.81 kg/m.  General Appearance: Disheveled  Eye Contact:  Poor  Speech:  Normal Rate  Volume:  Decreased  Mood:  Anxious and Irritable  Affect:  Congruent  Thought Process:  Coherent  Orientation:  Full (Time, Place, and Person)  Thought Content:  Delusions  Suicidal Thoughts:  No  Homicidal Thoughts:  No  Memory:  Immediate;   Poor Recent;   Poor Remote;   Poor  Judgement:  Impaired  Insight:  Lacking  Psychomotor Activity:  Increased  Concentration:  Concentration: Poor and Attention Span: Poor  Recall:  Poor  Fund of Knowledge:  Poor  Language:  Fair  Akathisia:  Negative  Handed:  Right  AIMS (if indicated):     Assets:  Social Support  ADL's:  Intact  Cognition:  WNL  Sleep:  Number of Hours: 5.5     Treatment Plan Summary: Daily contact with patient to assess and evaluate symptoms and progress in treatment, Medication management and Plan Patient is seen and examined.  Patient is a 32 year old male with a past psychiatric history significant for possible  bipolar disorder with psychotic features versus schizophrenia versus  schizoaffective disorder who is seen in follow-up.  Apparently the only medication he is been taking over the last week or so is his Neurontin.  He continues to complain of significant anxiety.  I will increase his gabapentin to 600 mg p.o. 3 times daily.  I have tried to talk to him about taking oral medications for his delusions, but he is unwilling to do that.  Apparently his parents are not interested in him being on long-acting injectable antipsychotic medications.  We will do what we can to try and convince him to take these medications.  Otherwise no other changes in his treatment at this point.  Sharma Covert, MD 05/09/2018, 12:16 PM

## 2018-05-10 MED ORDER — PALIPERIDONE ER 3 MG PO TB24
3.0000 mg | ORAL_TABLET | Freq: Every day | ORAL | Status: DC
  Administered 2018-05-10: 3 mg via ORAL
  Filled 2018-05-10 (×2): qty 1

## 2018-05-10 MED ORDER — PALIPERIDONE PALMITATE ER 234 MG/1.5ML IM SUSY
234.0000 mg | PREFILLED_SYRINGE | Freq: Once | INTRAMUSCULAR | Status: AC
  Administered 2018-05-10: 234 mg via INTRAMUSCULAR

## 2018-05-10 NOTE — Progress Notes (Signed)
Dar Note: Patient placed on forced medication order due to medication non compliance.  Hinda GlatterInvega Sustenna Injection prescribed which patient refused.  Medication was given by manual hold.  [facility] and patient assessed for safety.  Routine safety checks maintained every 15 minutes.  Food and fluid intake offered and encouraged.  Patient remained in his room with calm affect and mood.  Patient remained safe on the unit.  No complaint offered.

## 2018-05-10 NOTE — Progress Notes (Addendum)
[facility]  05/10/2018 10:12 AM Staci AcostaBenjamin V Magowan  MRN:  [id number] Subjective: Patient is seen and examined.  Patient is a 32 year old male with a past psychiatric history significant for traumatic brain injury, delusions, hallucinations.  His differential diagnosis is bipolar disorder with psychotic features versus schizophrenia.  He is seen in follow-up.  Last night he told the nurses that he wanted to take Invega at bedtime.  He stated this morning he just wants to be knocked out.  He has agreed to take the Invega at bedtime.  He stated he feels essentially unchanged from yesterday.  We took out the involuntary commitment paperwork today to be able to force medications on him, and this will continue to proceed.  His delusions continue. Principal Problem: Bipolar I disorder, current or most recent episode manic, with psychotic features ([facility]) Diagnosis:   Patient Active Problem List   Diagnosis Date Noted  . Acute psychosis ([facility]) [F23]   . Bipolar I disorder, current or most recent episode manic, with psychotic features ([facility]) [F31.2] 04/21/2018  . Suicidal ideations [R45.851]   . Polysubstance (including opioids) dependence with physiological dependence ([facility]) [F19.20] 04/02/2018  . 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 Delusions ([facility]) [F22]    Total Time spent with patient: 20 minutes  Past Psychiatric History: See admission H&P  Past Medical History:  Past Medical History:  Diagnosis Date  . Anxiety   . Substance abuse ([facility])    History reviewed. No pertinent surgical history. Family History: History reviewed. No pertinent family history. Family Psychiatric  History: See admission H&P Social History:  Social History   Substance and Sexual Activity  Alcohol Use Not Currently  . Alcohol/week: 0.0 oz     Social History   Substance and Sexual Activity  Drug Use No    Social History   Socioeconomic History  . Marital status: Single    Spouse  name: Not on file  . Number of children: Not on file  . Years of education: Not on file  . Highest education level: Not on file  Occupational History  . Not on file  Social Needs  . Financial resource strain: Not on file  . Food insecurity:    Worry: Not on file    Inability: Not on file  . Transportation needs:    Medical: Not on file    Non-medical: Not on file  Tobacco Use  . Smoking status: Former Games developermoker  . Smokeless tobacco: Never Used  Substance and Sexual Activity  . Alcohol use: Not Currently    Alcohol/week: 0.0 oz  . Drug use: No  . Sexual activity: Not on file  Lifestyle  . Physical activity:    Days per week: Not on file    Minutes per session: Not on file  . Stress: Not on file  Relationships  . Social connections:    Talks on phone: Not on file    Gets together: Not on file    Attends religious service: Not on file    Active member of club or organization: Not on file    Attends meetings of clubs or organizations: Not on file    Relationship status: Not on file  Other Topics Concern  . Not on file  Social History Narrative  . Not on file   Additional Social History:                         Sleep: Poor  Appetite:  Fair  Current Medications: Current Facility-Administered Medications  Medication Dose Route Frequency Provider Last Rate Last Dose  . acetaminophen (TYLENOL) tablet 650 mg  650 mg Oral Q6H PRN Laveda Abbe, NP      . alum & mag hydroxide-simeth (MAALOX/MYLANTA) 200-200-20 MG/5ML suspension 30 mL  30 mL Oral Q4H PRN Laveda Abbe, NP      . gabapentin (NEURONTIN) capsule 600 mg  600 mg Oral TID Antonieta Pert, MD   600 mg at 05/10/18 0851  . hydrOXYzine (ATARAX/VISTARIL) tablet 25 mg  25 mg Oral Q6H PRN Micheal Likens, MD      . ibuprofen (ADVIL,MOTRIN) tablet 600 mg  600 mg Oral Q6H PRN Micheal Likens, MD   600 mg at 04/25/18 2159  . LORazepam (ATIVAN) tablet 0.5 mg  0.5 mg Oral Q6H PRN  Micheal Likens, MD   0.5 mg at 05/09/18 2141  . magnesium hydroxide (MILK OF MAGNESIA) suspension 30 mL  30 mL Oral Daily PRN Laveda Abbe, NP      . OLANZapine [facility]) tablet 15 mg  15 mg Oral QHS Cobos, Rockey Situ, MD   15 mg at 05/09/18 2140  . OLANZapine zydis (ZYPREXA) disintegrating tablet 5 mg  5 mg Oral Q8H PRN Micheal Likens, MD   5 mg at 05/07/18 8469   And  . ziprasidone (GEODON) injection 20 mg  20 mg Intramuscular PRN Micheal Likens, MD      . ondansetron (ZOFRAN) tablet 4 mg  4 mg Oral Q8H PRN Micheal Likens, MD      . simethicone (MYLICON) chewable tablet 160 mg  160 mg Oral Q6H PRN Nira Conn A, NP      . traZODone (DESYREL) tablet 100 mg  100 mg Oral QHS PRN Cobos, Rockey Situ, MD        Lab Results: No results found for this or any previous visit (from the past 48 hour(s)).  Blood Alcohol level:  Lab Results  Component Value Date   ETH <10 04/20/2018    Metabolic Disorder Labs: Lab Results  Component Value Date   HGBA1C 5.6 05/08/2018   MPG 114.02 05/08/2018   Lab Results  Component Value Date   PROLACTIN 43.7 (H) 05/07/2018   Lab Results  Component Value Date   CHOL 141 05/08/2018   TRIG 210 (H) 05/08/2018   HDL 40 (L) 05/08/2018   CHOLHDL 3.5 05/08/2018   VLDL 42 (H) 05/08/2018   LDLCALC 59 05/08/2018    Physical Findings: AIMS: Facial and Oral Movements Muscles of Facial Expression: None, normal Lips and Perioral Area: None, normal Jaw: None, normal Tongue: None, normal,Extremity Movements Upper (arms, wrists, hands, fingers): None, normal Lower (legs, knees, ankles, toes): None, normal, Trunk Movements Neck, shoulders, hips: None, normal, Overall Severity Severity of abnormal movements (highest score from questions above): None, normal Incapacitation due to abnormal movements: None, normal Patient's awareness of abnormal movements (rate only patient's report): No Awareness, Dental  Status Current problems with teeth and/or dentures?: No Does patient usually wear dentures?: No  CIWA:  CIWA-Ar Total: 1 COWS:  COWS Total Score: 2  Musculoskeletal: Strength & Muscle Tone: within normal limits Gait & Station: normal Patient leans: N/A  Psychiatric Specialty Exam: Physical Exam  Constitutional: He is oriented to person, place, and time. He appears well-developed and well-nourished.  HENT:  Head: Normocephalic and atraumatic.  Respiratory: Effort normal.  Neurological: He is alert and oriented to person, place, and time.  ROS  Blood pressure (!) 129/93, pulse (!) 110, temperature 99.2 F (37.3 C), temperature source Oral, resp. rate 20, height 5\' 8"  (1.727 m), weight 68 kg (150 lb).Body mass index is 22.81 kg/m.  General Appearance: Disheveled  Eye Contact:  Poor  Speech:  Slow  Volume:  Decreased  Mood:  Dysphoric  Affect:  Congruent  Thought Process:  Coherent  Orientation:  Full (Time, Place, and Person)  Thought Content:  Delusions  Suicidal Thoughts:  No  Homicidal Thoughts:  No  Memory:  Immediate;   Fair Recent;   Fair Remote;   Fair  Judgement:  Impaired  Insight:  Lacking  Psychomotor Activity:  Decreased  Concentration:  Concentration: Fair and Attention Span: Fair  Recall:  [employer] of Knowledge:  Fair  Language:  Fair  Akathisia:  Negative  Handed:  Right  AIMS (if indicated):     Assets:  Housing Social Support  ADL's:  Intact  Cognition:  WNL  Sleep:  Number of Hours: 5.75     Treatment Plan Summary: Daily contact with patient to assess and evaluate symptoms and progress in treatment, Medication management and Plan Patient is seen and examined.  Patient is a 32 year old male with the above-stated past psychiatric history seen in follow-up.  At least this morning he stated he is willing to take the invega at bedtime.  We will start at 3 mg p.o. nightly.  We will go on and proceed with involuntary commitment paperwork so that we  can force medications of the long-acting injectable medication.  The invega will probably be our choice.  He stated he felt like the increase Neurontin was helpful, and we will continue that for now.  No other changes in his current medications.  Antonieta Pert, MD  05/10/2018, 10:12 AM   Addendum  Patient is seen and examined.  We have decided to go on and force meds on him today.  The involuntary commitment paperwork will be updated, and Dr. Sharman Crate has seen the patient and agrees with force medications.  I have spoken with the patient's mother and informed her that we would give Invega sustenna 137 mg today.  He will have a second injection in 7 days.  I will go on and continue the oral inVega the night if he is willing to take it.

## 2018-05-10 NOTE — Plan of Care (Signed)
  Problem: Activity: Goal: Interest or engagement in activities will improve Outcome: Not Progressing   Problem: Health Behavior/Discharge Planning: Goal: Compliance with treatment plan for underlying cause of condition will improve Outcome: Not Progressing   Problem: Safety: Goal: Periods of time without injury will increase Outcome: Progressing   Problem: Coping: Goal: Coping ability will improve Outcome: Not Progressing  DAR NOTE: Patient presents with anxious affect and mood.  Denies suicidal thoughts, pain, auditory and visual hallucinations.  Remained withdrawn and isolates to his room.  Refused to come out for meals.  Maintained on routine safety checks.  Medications given as prescribed.  Support and encouragement offered as needed.  Patient is safe on the unit.

## 2018-05-10 NOTE — Progress Notes (Addendum)
Pt was observed in the room, seen pacing. Pt did not attend wrap-up group. Pt appears irritable/flat in affect and mood. Pt denies SI/HI/AVH at this time. Pt rates pain 7/10; shoulder. Pt states "Just leave me alone right now". Bedtime meds requested early. Pt remains isolative in milieu.PRN ativan, ibuprofen, and trazodone requested and given. Support and encouragement provided. Pt was cooperative.Will continue with POC.

## 2018-05-10 NOTE — [facility] Group Notes (Signed)
LCSW Group Therapy Note 05/10/2018 3:32 PM  Type of Therapy and Topic: Group Therapy: Overcoming Obstacles  Participation Level: Did Not Attend  Description of Group:  In this group patients will be encouraged to explore what they see as obstacles to their own wellness and recovery. They will be guided to discuss their thoughts, feelings, and behaviors related to these obstacles. The group will process together ways to cope with barriers, with attention given to specific choices patients can make. Each patient will be challenged to identify changes they are motivated to make in order to overcome their obstacles. This group will be process-oriented, with patients participating in exploration of their own experiences as well as giving and receiving support and challenge from other group members.  Therapeutic Goals: 1. Patient will identify personal and current obstacles as they relate to admission. 2. Patient will identify barriers that currently interfere with their wellness or overcoming obstacles.  3. Patient will identify feelings, thought process and behaviors related to these barriers. 4. Patient will identify two changes they are willing to make to overcome these obstacles:   Summary of Patient Progress  Invited, chose not to attend.    Therapeutic Modalities:  Cognitive Behavioral Therapy Solution Focused Therapy Motivational Interviewing Relapse Prevention Therapy   Alcario DroughtJolan Alejo Beamer LCSWA Clinical Social Worker

## 2018-05-10 NOTE — Progress Notes (Signed)
Recreation Therapy Notes  Animal-Assisted Activity (AAA) Program Checklist/Progress Notes Patient Eligibility Criteria Checklist & Daily Group note for Rec Tx Intervention  Date: 7.2.19 Time: 1430 [address]  AAA/T Program Assumption of Risk Form signed by Engineer, productionatient/ or Parent Legal Guardian YES  Patient is free of allergies or sever asthma YES   Patient reports no fear of animals YES   Patient reports no history of cruelty to animals YES   Patient understands his/her participation is voluntary YES   Patient washes hands before animal contact YES   Patient washes hands after animal contact YES   Behavioral Response: Engaged  Education: Charity fundraiserHand Washing, Appropriate Animal Interaction   Education Outcome: Acknowledges understanding/In group clarification offered/Needs additional education.   Clinical Observations/Feedback: Pt attended and participated in activity.    Caroll RancherMarjette Jamirra Curnow, LRT/CTRS         Caroll RancherLindsay, Nylah Butkus A 05/10/2018 3:40 PM

## 2018-05-10 NOTE — BH Specialist Note (Signed)
[facility] Progress Note for Medication Administration to Non-consenting Patients (For Involuntarily Committed Patients)  Patient: Maurice [name] Date of Birth: [id number] 09, 1987 MRN: [id number]  Reason for the Medication: The patient, without the benefit of the specific treatment measure, is incapable of participating in any available treatment plan that will give the patient a realistic opportunity of improving the patient's condition.  Consideration of Side Effects: Consideration of the side effects related to the medication plan has been given.  Rationale for Medication Administration: Patient has had prolonged psychiatric admission with acute psychosis and delusional disorder which has prevented him from benefiting from treatment. He has been isolating self to room and inconsistently taking medications to manage his psychosis and paranoia. He states that he can not adhere to treatment team meeting and groups due to severe social anxiety.  Can not eat meat due to being bitten by a lone star tic in the past, and can not take injections due to being 1[place]8 [place].  He would benefit from consistent medication which can be attained with a long acting injectable antipsychotic. He has tolerated Invega in the past, and I am in agreement that he should receive Invega Sustenna 156 mg IM.     Mariel CraftSHEILA M Luanna Weesner, MD 05/10/18  12:57 PM   This documentation is good for (7) seven days from the date of the MD signature. New documentation must be completed every seven (7) days with detailed justification in the medical record if the patient requires continued non-emergent administration of psychotropic medications.

## 2018-05-10 NOTE — Progress Notes (Signed)
CSW spoke to pt mother Alfredia ClientMary Jo and gave her update that pt has not been taking his oral medication.  Mother states she asked specifically about whether he was taking meds over the weekend and was told by the RN that he was taking them.  Mother upset about mixed messages, said pt has not had sheets changed, has not been given meals on the unit when he was too anxious to go to cafeteria.  She would like to speak to Dr Jola Babinskilary about what is happening and what the plan is. Garner NashGregory Nana Vastine, MSW, LCSW Clinical Social Worker 05/10/2018 10:47 AM

## 2018-05-11 MED ORDER — PALIPERIDONE ER 6 MG PO TB24
6.0000 mg | ORAL_TABLET | Freq: Every day | ORAL | Status: DC
  Administered 2018-05-11 – 2018-05-14 (×4): 6 mg via ORAL
  Filled 2018-05-11 (×7): qty 1

## 2018-05-11 NOTE — Tx Team (Signed)
Interdisciplinary Treatment and Diagnostic Plan Update  05/11/2018 Time of Session: [place] MRN: [id number]  Principal Diagnosis: Bipolar I disorder, current or most recent episode manic, with psychotic features ([place])  Secondary Diagnoses: Principal Problem:   Bipolar I disorder, current or most recent episode manic, with psychotic features ([place]) Active Problems:   Acute psychosis ([place])   Current Medications:  Current Facility-Administered Medications  Medication Dose Route Frequency Provider Last Rate Last Dose  . acetaminophen (TYLENOL) tablet 650 mg  650 mg Oral Q6H PRN Ethelene Hal, NP      . alum & mag hydroxide-simeth (MAALOX/MYLANTA) 200-200-20 MG/5ML suspension 30 mL  30 mL Oral Q4H PRN Ethelene Hal, NP      . gabapentin (NEURONTIN) capsule 600 mg  600 mg Oral TID Sharma Covert, MD   600 mg at 05/11/18 1304  . hydrOXYzine (ATARAX/VISTARIL) tablet 25 mg  25 mg Oral Q6H PRN Pennelope Bracken, MD      . ibuprofen (ADVIL,MOTRIN) tablet 600 mg  600 mg Oral Q6H PRN Pennelope Bracken, MD   600 mg at 05/10/18 2001  . LORazepam (ATIVAN) tablet 0.5 mg  0.5 mg Oral Q6H PRN Pennelope Bracken, MD   0.5 mg at 05/10/18 2001  . magnesium hydroxide (MILK OF MAGNESIA) suspension 30 mL  30 mL Oral Daily PRN Ethelene Hal, NP      . OLANZapine zydis (ZYPREXA) disintegrating tablet 5 mg  5 mg Oral Q8H PRN Pennelope Bracken, MD   5 mg at 05/07/18 9147   And  . ziprasidone (GEODON) injection 20 mg  20 mg Intramuscular PRN Pennelope Bracken, MD      . ondansetron (ZOFRAN) tablet 4 mg  4 mg Oral Q8H PRN Pennelope Bracken, MD      . paliperidone (INVEGA) 24 hr tablet 6 mg  6 mg Oral QHS Sharma Covert, MD      . simethicone [facility] An) chewable tablet 160 mg  160 mg Oral Q6H PRN Lindon Romp A, NP      . traZODone (DESYREL) tablet 100 mg  100 mg Oral QHS PRN Cobos, Myer Peer, MD   100 mg at 05/10/18 2001    PTA  Medications: Medications Prior to Admission  Medication Sig Dispense Refill Last Dose  . ALPRAZolam (XANAX) 1 MG tablet Take 1 mg by mouth daily as needed.  4 04/20/2018 at Unknown time  . amitriptyline (ELAVIL) 25 MG tablet Take 1 tablet (25 mg total) by mouth at bedtime. (Patient not taking: Reported on 04/20/2018) 10 tablet 0 Not Taking at Unknown time  . amphetamine-dextroamphetamine (ADDERALL) 20 MG tablet Take 20 mg by mouth daily.  0 Past Week at Unknown time  . Buprenorphine HCl-Naloxone HCl 8-2 MG FILM Place 1 tablet under the tongue daily.    04/01/2018 at Unknown time  . ibuprofen (ADVIL,MOTRIN) 800 MG tablet Take 1 tablet (800 mg total) by mouth 3 (three) times daily. 21 tablet 0 Past Week at Unknown time  . Ibuprofen-diphenhydrAMINE Cit (ADVIL PM) 200-38 MG TABS Take 2 tablets by mouth at bedtime as needed (pain).   Past Week at Unknown time  . meclizine (ANTIVERT) 25 MG tablet Take 1 tablet (25 mg total) by mouth 3 (three) times daily as needed for dizziness. 30 tablet 0 Past Week at Unknown time  . mometasone (NASONEX) 50 MCG/ACT nasal spray Place 2 sprays into the nose daily. (Patient not taking: Reported on 04/20/2018) 17 g 1 Not Taking  at Unknown time  . ondansetron (ZOFRAN ODT) 4 MG disintegrating tablet Take 1 tablet (4 mg total) by mouth every 8 (eight) hours as needed for nausea. (Patient not taking: Reported on 04/20/2018) 10 tablet 0 Not Taking at Unknown time  . promethazine (PHENERGAN) 25 MG tablet Take 1 tablet (25 mg total) by mouth every 8 (eight) hours as needed for nausea or vomiting. 20 tablet 0 Past Week at Unknown time    Patient Stressors: Medication change or noncompliance  Patient Strengths: Communication skills  Treatment Modalities: Medication Management, Group therapy, Case management,  1 to 1 session with clinician, Psychoeducation, Recreational therapy.   Physician Treatment Plan for Primary Diagnosis: Bipolar I disorder, current or most recent episode  manic, with psychotic features ([place]) Long Term Goal(s): Improvement in symptoms so as ready for discharge  Short Term Goals: Ability to identify and develop effective coping behaviors will improve Ability to identify changes in lifestyle to reduce recurrence of condition will improve  Medication Management: Evaluate patient's response, side effects, and tolerance of medication regimen.  Therapeutic Interventions: 1 to 1 sessions, Unit Group sessions and Medication administration.  Evaluation of Outcomes: Progressing   6/19: "There's something called the trickster that controls me and makes me say things. He repeats things over and over in my head." When asked to provide an example, pt describes the thoughts as derogatory and ego dystonic, and he provides example, "He says that me and my wife don't know each other." Discussed with patient about changing abilify to a different medication to address AH and paranoia, and he was in agreement to trial of [place]. Discussed with patient that likely recommendation if he has good tolerability and efficacy will be to transition to long-acting injectable form, and pt verbalized understanding.    Physician Treatment Plan for Secondary Diagnosis: Principal Problem:   Bipolar I disorder, current or most recent episode manic, with psychotic features ([place]) Active Problems:   Acute psychosis ([place])   Long Term Goal(s): Improvement in symptoms so as ready for discharge  Short Term Goals: Ability to identify and develop effective coping behaviors will improve Ability to identify changes in lifestyle to reduce recurrence of condition will improve  Medication Management: Evaluate patient's response, side effects, and tolerance of medication regimen.  Therapeutic Interventions: 1 to 1 sessions, Unit Group sessions and Medication administration.  Evaluation of Outcomes: Progressing   RN Treatment Plan for Primary Diagnosis: Bipolar I disorder, current or  most recent episode manic, with psychotic features ([place]) Long Term Goal(s): Knowledge of disease and therapeutic regimen to maintain health will improve  Short Term Goals: Ability to identify and develop effective coping behaviors will improve and Compliance with prescribed medications will improve  Medication Management: RN will administer medications as ordered by provider, will assess and evaluate patient's response and provide education to patient for prescribed medication. RN will report any adverse and/or side effects to prescribing provider.  Therapeutic Interventions: 1 on 1 counseling sessions, Psychoeducation, Medication administration, Evaluate responses to treatment, Monitor vital signs and CBGs as ordered, Perform/monitor CIWA, COWS, AIMS and Fall Risk screenings as ordered, Perform wound care treatments as ordered.  Evaluation of Outcomes: Progressing   LCSW Treatment Plan for Primary Diagnosis: Bipolar I disorder, current or most recent episode manic, with psychotic features ([place]) Long Term Goal(s): Safe transition to appropriate next level of care at discharge, Engage patient in therapeutic group addressing interpersonal concerns.  Short Term Goals: Engage patient in aftercare planning with referrals and resources  Therapeutic  Interventions: Assess for all discharge needs, 1 to 1 time with Education officer, museum, Explore available resources and support systems, Assess for adequacy in community support network, Educate family and significant other(s) on suicide prevention, Complete Psychosocial Assessment, Interpersonal group therapy.  Evaluation of Outcomes: Met  Return home, follow up Dr Toy Care   Progress in Treatment: Attending groups: No Participating in groups: No Taking medication as prescribed: Yes Toleration medication: Yes, no side effects reported at this time Family/Significant other contact made: Yes Patient understands diagnosis: No Limited insight Discussing patient  identified problems/goals with staff: Yes Medical problems stabilized or resolved: Yes Denies suicidal/homicidal ideation: Yes Issues/concerns per patient self-inventory: None Other: N/A  New problem(s) identified: None identified at this time.   New Short Term/Long Term Goal(s): "I'm just here to relax."  Discharge Plan or Barriers:   Reason for Continuation of Hospitalization:  Delusions   Medication stabilization   Estimated Length of Stay: 2-4 days  Attendees: Patient: 05/11/2018   Physician: Dr Mallie Darting, MD 05/11/2018   Nursing: Elesa Massed, RN 05/11/2018   RN Care Manager: 05/11/2018   Social Worker: Lurline Idol, LCSW 05/11/2018   Recreational Therapist:  05/11/2018   Other:  05/11/2018   Other:  05/11/2018   Other: 05/11/2018                 Scribe for Treatment Team:  Lurline Idol, LCSW

## 2018-05-11 NOTE — Plan of Care (Signed)
  Problem: Safety: Goal: Periods of time without injury will increase Outcome: Progressing   Problem: Health Behavior/Discharge Planning: Goal: Compliance with prescribed medication regimen will improve Outcome: Progressing   Problem: Self-Care: Goal: Ability to participate in self-care as condition permits will improve Outcome: Not Progressing  DAR NOTE: Patient presents with anxious affect and depressed mood.  Denies suicidal thoughts, pain, auditory and visual hallucinations.  Rates depression at 0 hopelessness at 0, and anxiety at 0.  Maintained on routine safety checks.  Medications given as prescribed.  Support and encouragement offered as needed.  Attended group but left after 5 minutes.  Patient remained withdrawn and isolates to his room most of this shift.  Offered no complaint.

## 2018-05-11 NOTE — Progress Notes (Signed)
Did not attend group 

## 2018-05-11 NOTE — Progress Notes (Signed)
Pt was observed in the room, seen resting in bed with eyes open. Pt did not attend wrap-up group. Pt's father came to visit this evening and appears supportive.Pt appears irritable/flat in affect and mood. Pt denies SI/HI/AVH at this time. Pt rates pain 5/10; shoulder. Pt states he did not had a good day. Pt endorses that he can't sleep due to having a roommate present. Bedtime meds requested early. Pt remains isolative in milieu.PRN ativan, ibuprofen, and trazodone requested and given. Invega PO was increase to 6 mg this evening. Support and encouragement provided. Pt was cooperative.Will continue with POC.

## 2018-05-11 NOTE — [facility] Group Notes (Signed)
[facility] LCSW Group Therapy Note  Date/Time: 05/11/18, 1315  Type of Therapy and Topic:  Group Therapy:  Overcoming Obstacles  Participation Level:  Did not attend  Description of Group:    In this group patients will be encouraged to explore what they see as obstacles to their own wellness and recovery. They will be guided to discuss their thoughts, feelings, and behaviors related to these obstacles. The group will process together ways to cope with barriers, with attention given to specific choices patients can make. Each patient will be challenged to identify changes they are motivated to make in order to overcome their obstacles. This group will be process-oriented, with patients participating in exploration of their own experiences as well as giving and receiving support and challenge from other group members.  Therapeutic Goals: 1. Patient will identify personal and current obstacles as they relate to admission. 2. Patient will identify barriers that currently interfere with their wellness or overcoming obstacles.  3. Patient will identify feelings, thought process and behaviors related to these barriers. 4. Patient will identify two changes they are willing to make to overcome these obstacles:    Summary of Patient Progress: Pt came into group at the beginning but got up and left as soon as CSW started group.      Therapeutic Modalities:   Cognitive Behavioral Therapy Solution Focused Therapy Motivational Interviewing Relapse Prevention Therapy  Daleen SquibbGreg Demarr Kluever, LCSW

## 2018-05-11 NOTE — Progress Notes (Signed)
[facility] MD Progress Note  05/11/2018 1:35 PM Maurice [name]  MRN:  [id number] Subjective: Patient is seen and examined.  Patient is a 32 year old male with a past psychiatric history significant for psychosis, delusions, hallucinations and a traumatic brain injury.  His differential diagnosis is bipolar disorder with psychotic features versus schizophrenia.  He is seen in follow-up.  He received force medications yesterday.  He was given 234 mg of Invega Sustenna IM.  Because of his agitation he was transferred back to the 500 hall.  He did take the oral inVega last night, and he reports that he did sleep well.  He stated he knew he would not sleep well tonight because he had a roommate.  I told him that we would be unable to move him into a single room because the fact that the hospital was full.  He stated "I have nothing else to say to you then". Principal Problem: Bipolar I disorder, current or most recent episode manic, with psychotic features (HCC) Diagnosis:   Patient Active Problem List   Diagnosis Date Noted  . Acute psychosis (HCC) [F23]   . Bipolar I disorder, current or most recent episode manic, with psychotic features (HCC) [F31.2] 04/21/2018  . Suicidal ideations [R45.851]   . Polysubstance (including opioids) dependence with physiological dependence (HCC) [F19.20] 04/02/2018  . 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 Delusions (HCC) [F22]    Total Time spent with patient: 15 minutes  Past Psychiatric History: See admission H&P  Past Medical History:  Past Medical History:  Diagnosis Date  . Anxiety   . Substance abuse (HCC)    History reviewed. No pertinent surgical history. Family History: History reviewed. No pertinent family history. Family Psychiatric  History: See admission H&P Social History:  Social History   Substance and Sexual Activity  Alcohol Use Not Currently  . Alcohol/week: 0.0 oz     Social History   Substance and Sexual  Activity  Drug Use No    Social History   Socioeconomic History  . Marital status: Single    Spouse name: Not on file  . Number of children: Not on file  . Years of education: Not on file  . Highest education level: Not on file  Occupational History  . Not on file  Social Needs  . Financial resource strain: Not on file  . Food insecurity:    Worry: Not on file    Inability: Not on file  . Transportation needs:    Medical: Not on file    Non-medical: Not on file  Tobacco Use  . Smoking status: Former Games developer  . Smokeless tobacco: Never Used  Substance and Sexual Activity  . Alcohol use: Not Currently    Alcohol/week: 0.0 oz  . Drug use: No  . Sexual activity: Not on file  Lifestyle  . Physical activity:    Days per week: Not on file    Minutes per session: Not on file  . Stress: Not on file  Relationships  . Social connections:    Talks on phone: Not on file    Gets together: Not on file    Attends religious service: Not on file    Active member of club or organization: Not on file    Attends meetings of clubs or organizations: Not on file    Relationship status: Not on file  Other Topics Concern  . Not on file  Social History Narrative  . Not on file   Additional Social  History:                         Sleep: Fair  Appetite:  Fair  Current Medications: Current Facility-Administered Medications  Medication Dose Route Frequency Provider Last Rate Last Dose  . acetaminophen (TYLENOL) tablet 650 mg  650 mg Oral Q6H PRN Laveda Abbe, NP      . alum & mag hydroxide-simeth (MAALOX/MYLANTA) 200-200-20 MG/5ML suspension 30 mL  30 mL Oral Q4H PRN Laveda Abbe, NP      . gabapentin (NEURONTIN) capsule 600 mg  600 mg Oral TID Antonieta Pert, MD   600 mg at 05/11/18 1304  . hydrOXYzine (ATARAX/VISTARIL) tablet 25 mg  25 mg Oral Q6H PRN Micheal Likens, MD      . ibuprofen (ADVIL,MOTRIN) tablet 600 mg  600 mg Oral Q6H PRN  Micheal Likens, MD   600 mg at 05/10/18 2001  . LORazepam (ATIVAN) tablet 0.5 mg  0.5 mg Oral Q6H PRN Micheal Likens, MD   0.5 mg at 05/10/18 2001  . magnesium hydroxide (MILK OF MAGNESIA) suspension 30 mL  30 mL Oral Daily PRN Laveda Abbe, NP      . OLANZapine zydis (ZYPREXA) disintegrating tablet 5 mg  5 mg Oral Q8H PRN Micheal Likens, MD   5 mg at 05/07/18 1610   And  . ziprasidone (GEODON) injection 20 mg  20 mg Intramuscular PRN Micheal Likens, MD      . ondansetron (ZOFRAN) tablet 4 mg  4 mg Oral Q8H PRN Micheal Likens, MD      . paliperidone (INVEGA) 24 hr tablet 3 mg  3 mg Oral QHS Antonieta Pert, MD   3 mg at 05/10/18 2001  . simethicone (MYLICON) chewable tablet 160 mg  160 mg Oral Q6H PRN Nira Conn A, NP      . traZODone (DESYREL) tablet 100 mg  100 mg Oral QHS PRN Cobos, Rockey Situ, MD   100 mg at 05/10/18 2001    Lab Results: No results found for this or any previous visit (from the past 48 hour(s)).  Blood Alcohol level:  Lab Results  Component Value Date   ETH <10 04/20/2018    Metabolic Disorder Labs: Lab Results  Component Value Date   HGBA1C 5.6 05/08/2018   MPG 114.02 05/08/2018   Lab Results  Component Value Date   PROLACTIN 43.7 (H) 05/07/2018   Lab Results  Component Value Date   CHOL 141 05/08/2018   TRIG 210 (H) 05/08/2018   HDL 40 (L) 05/08/2018   CHOLHDL 3.5 05/08/2018   VLDL 42 (H) 05/08/2018   LDLCALC 59 05/08/2018    Physical Findings: AIMS: Facial and Oral Movements Muscles of Facial Expression: None, normal Lips and Perioral Area: None, normal Jaw: None, normal Tongue: None, normal,Extremity Movements Upper (arms, wrists, hands, fingers): None, normal Lower (legs, knees, ankles, toes): None, normal, Trunk Movements Neck, shoulders, hips: None, normal, Overall Severity Severity of abnormal movements (highest score from questions above): None, normal Incapacitation due to  abnormal movements: None, normal Patient's awareness of abnormal movements (rate only patient's report): No Awareness, Dental Status Current problems with teeth and/or dentures?: No Does patient usually wear dentures?: No  CIWA:  CIWA-Ar Total: 1 COWS:  COWS Total Score: 2  Musculoskeletal: Strength & Muscle Tone: within normal limits Gait & Station: normal Patient leans: N/A  Psychiatric Specialty Exam: Physical Exam  Constitutional: He is oriented  to person, place, and time. He appears well-developed and well-nourished.  HENT:  Head: Normocephalic and atraumatic.  Respiratory: Effort normal.  Neurological: He is alert and oriented to person, place, and time.    ROS  Blood pressure (!) 129/93, pulse (!) 110, temperature 99.2 F (37.3 C), temperature source Oral, resp. rate 20, height 5\' 8"  (1.727 m), weight 68 kg (150 lb).Body mass index is 22.81 kg/m.  General Appearance: Disheveled  Eye Contact:  Minimal  Speech:  Normal Rate  Volume:  Normal  Mood:  Irritable  Affect:  Congruent  Thought Process:  Linear  Orientation:  Full (Time, Place, and Person)  Thought Content:  Delusions  Suicidal Thoughts:  No  Homicidal Thoughts:  No  Memory:  Immediate;   Poor Recent;   Poor Remote;   Poor  Judgement:  Impaired  Insight:  Lacking  Psychomotor Activity:  Normal  Concentration:  Concentration: Unable to fully assess and Attention Span: Unable to fully assess  Recall:  Poor  Fund of Knowledge:  Fair  Language:  Fair  Akathisia:  Negative  Handed:  Right  AIMS (if indicated):     Assets:  Physical Health  ADL's:  Intact  Cognition:  WNL  Sleep:  Number of Hours: 5.75     Treatment Plan Summary: Daily contact with patient to assess and evaluate symptoms and progress in treatment, Medication management and Plan Patient is seen and examined.  Patient is a 32 year old male with the above-stated past psychiatric history is seen in follow-up.  He received the long-acting  and inVega sustanna an injection yesterday.,  And hopefully that will kick in soon.  He slept about 6 hours last night.  I am going to increase the oral in Vega to 6 mg p.o. nightly.  Hopefully he will take that.  No other changes in his current medications.  Antonieta PertGreg Lawson Louden Houseworth, MD 05/11/2018, 1:35 PM

## 2018-05-11 NOTE — Progress Notes (Signed)
Recreation Therapy Notes  Date: 7.3.19 Time: 1000 Location: 500 Hall Dayroom  Group Topic: Communication  Goal Area(s) Addresses:  Patient will effectively communicate with peers in group.  Patient will verbalize benefit of healthy communication. Patient will verbalize positive effect of healthy communication on post d/c goals.  Patient will identify communication techniques that made activity effective for group.   Intervention: Drawings  Activity:  Merchandiser, retailGeometrical Drawings.  Patients were paired up and sitting back to back.  Each patient was given the opportunity to listen and be the speaker.  The person speaking had to describe the picture as it appears to their partner.  The only question the listener could ask was for the speaker to repeat themselves.  Education: Communication, Discharge Planning  Education Outcome: Acknowledges understanding/In group clarification offered/Needs additional education.   Clinical Observations/Feedback:  Pt did not attend group.    Caroll RancherMarjette Tura Roller, LRT/CTRS         Lillia AbedLindsay, Marshun Duva A 05/11/2018 11:55 AM

## 2018-05-12 MED ORDER — GABAPENTIN 400 MG PO CAPS
400.0000 mg | ORAL_CAPSULE | Freq: Three times a day (TID) | ORAL | Status: DC
  Filled 2018-05-12 (×6): qty 1

## 2018-05-12 MED ORDER — BUSPIRONE HCL 5 MG PO TABS
5.0000 mg | ORAL_TABLET | Freq: Two times a day (BID) | ORAL | Status: DC
  Administered 2018-05-12 – 2018-05-16 (×8): 5 mg via ORAL
  Filled 2018-05-12 (×13): qty 1

## 2018-05-12 NOTE — Progress Notes (Signed)
Nursing Progress Note: 7p-7a D: Pt currently presents with a anxious/irritable/disheveled/concrete/guraded/minimal/restless/paranoid affect and behavior. Pt states "I am not doing well. I do not want to talk about it." Not interacting with the milieu. Pt reports fair sleep during the previous night with current medication regimen. Pt did not attend wrap-up group.  A: Pt provided with medications per providers orders. Pt's labs and vitals were monitored throughout the night. Pt supported emotionally and encouraged to express concerns and questions. Pt educated on medications.  R: Pt's safety ensured with 15 minute and environmental checks. Pt currently denies SI, HI, and AVH. Pt verbally contracts to seek staff if SI,HI, or AVH occurs and to consult with staff before acting on any harmful thoughts. Will continue to monitor.

## 2018-05-12 NOTE — Progress Notes (Signed)
[facility] MD Progress Note  05/12/2018 2:30 PM Maurice [name]  MRN:  [id number] Subjective: Patient is seen and examined.  Patient is a 32 year old male with a past psychiatric history significant for psychosis, delusions, hallucinations and a traumatic brain injury.  Diagnosis is bipolar disorder with psychotic features versus schizophrenia.  He is seen in follow-up.  He has continued to complain of anxiety, and now thinks that the gabapentin is what is causing his heart to flutter.  He told the staff yesterday in treatment team that he thought he was being controlled by something called "the Trickster".  TheTtrickster makes him say things that he does not want to say.  He denied any auditory or visual hallucinations today.  His delusions appear to be less disturbing to him today.  He did take 6 mg of Invega by mouth last night and he stated did help with sleep. Principal Problem: Bipolar I disorder, current or most recent episode manic, with psychotic features (HCC) Diagnosis:   Patient Active Problem List   Diagnosis Date Noted  . Acute psychosis (HCC) [F23]   . Bipolar I disorder, current or most recent episode manic, with psychotic features (HCC) [F31.2] 04/21/2018  . Suicidal ideations [R45.851]   . Polysubstance (including opioids) dependence with physiological dependence (HCC) [F19.20] 04/02/2018  . 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 Delusions (HCC) [F22]    Total Time spent with patient: 20 minutes  Past Psychiatric History: See admission H&P  Past Medical History:  Past Medical History:  Diagnosis Date  . Anxiety   . Substance abuse (HCC)    History reviewed. No pertinent surgical history. Family History: History reviewed. No pertinent family history. Family Psychiatric  History: See admission H&P Social History:  Social History   Substance and Sexual Activity  Alcohol Use Not Currently  . Alcohol/week: 0.0 oz     Social History   Substance and  Sexual Activity  Drug Use No    Social History   Socioeconomic History  . Marital status: Single    Spouse name: Not on file  . Number of children: Not on file  . Years of education: Not on file  . Highest education level: Not on file  Occupational History  . Not on file  Social Needs  . Financial resource strain: Not on file  . Food insecurity:    Worry: Not on file    Inability: Not on file  . Transportation needs:    Medical: Not on file    Non-medical: Not on file  Tobacco Use  . Smoking status: Former Games developer  . Smokeless tobacco: Never Used  Substance and Sexual Activity  . Alcohol use: Not Currently    Alcohol/week: 0.0 oz  . Drug use: No  . Sexual activity: Not on file  Lifestyle  . Physical activity:    Days per week: Not on file    Minutes per session: Not on file  . Stress: Not on file  Relationships  . Social connections:    Talks on phone: Not on file    Gets together: Not on file    Attends religious service: Not on file    Active member of club or organization: Not on file    Attends meetings of clubs or organizations: Not on file    Relationship status: Not on file  Other Topics Concern  . Not on file  Social History Narrative  . Not on file   Additional Social History:  Sleep: Good  Appetite:  Good  Current Medications: Current Facility-Administered Medications  Medication Dose Route Frequency Provider Last Rate Last Dose  . acetaminophen (TYLENOL) tablet 650 mg  650 mg Oral Q6H PRN Laveda AbbeParks, Laurie Britton, NP      . alum & mag hydroxide-simeth (MAALOX/MYLANTA) 200-200-20 MG/5ML suspension 30 mL  30 mL Oral Q4H PRN Laveda AbbeParks, Laurie Britton, NP      . gabapentin (NEURONTIN) capsule 600 mg  600 mg Oral TID Antonieta Pertlary, Delpha Perko Lawson, MD   600 mg at 05/11/18 1850  . hydrOXYzine (ATARAX/VISTARIL) tablet 25 mg  25 mg Oral Q6H PRN Micheal Likensainville, Christopher T, MD      . ibuprofen (ADVIL,MOTRIN) tablet 600 mg  600 mg Oral Q6H PRN  Micheal Likensainville, Christopher T, MD   600 mg at 05/11/18 2037  . LORazepam (ATIVAN) tablet 0.5 mg  0.5 mg Oral Q6H PRN Micheal Likensainville, Christopher T, MD   0.5 mg at 05/11/18 2036  . magnesium hydroxide (MILK OF MAGNESIA) suspension 30 mL  30 mL Oral Daily PRN Laveda AbbeParks, Laurie Britton, NP      . OLANZapine zydis (ZYPREXA) disintegrating tablet 5 mg  5 mg Oral Q8H PRN Micheal Likensainville, Christopher T, MD   5 mg at 05/12/18 [id number]   And  . ziprasidone (GEODON) injection 20 mg  20 mg Intramuscular PRN Micheal Likensainville, Christopher T, MD      . ondansetron (ZOFRAN) tablet 4 mg  4 mg Oral Q8H PRN Micheal Likensainville, Christopher T, MD      . paliperidone (INVEGA) 24 hr tablet 6 mg  6 mg Oral QHS Antonieta Pertlary, Isaiyah Feldhaus Lawson, MD   6 mg at 05/11/18 2035  . simethicone (MYLICON) chewable tablet 160 mg  160 mg Oral Q6H PRN Nira ConnBerry, Jason A, NP      . traZODone (DESYREL) tablet 100 mg  100 mg Oral QHS PRN Cobos, Rockey SituFernando A, MD   100 mg at 05/11/18 2036    Lab Results: No results found for this or any previous visit (from the past 48 hour(s)).  Blood Alcohol level:  Lab Results  Component Value Date   ETH <10 04/20/2018    Metabolic Disorder Labs: Lab Results  Component Value Date   HGBA1C 5.6 05/08/2018   MPG 114.02 05/08/2018   Lab Results  Component Value Date   PROLACTIN 43.7 (H) 05/07/2018   Lab Results  Component Value Date   CHOL 141 05/08/2018   TRIG 210 (H) 05/08/2018   HDL 40 (L) 05/08/2018   CHOLHDL 3.5 05/08/2018   VLDL 42 (H) 05/08/2018   LDLCALC 59 05/08/2018    Physical Findings: AIMS: Facial and Oral Movements Muscles of Facial Expression: None, normal Lips and Perioral Area: None, normal Jaw: None, normal Tongue: None, normal,Extremity Movements Upper (arms, wrists, hands, fingers): None, normal Lower (legs, knees, ankles, toes): None, normal, Trunk Movements Neck, shoulders, hips: None, normal, Overall Severity Severity of abnormal movements (highest score from questions above): None, normal Incapacitation due to  abnormal movements: None, normal Patient's awareness of abnormal movements (rate only patient's report): No Awareness, Dental Status Current problems with teeth and/or dentures?: No Does patient usually wear dentures?: No  CIWA:  CIWA-Ar Total: 1 COWS:  COWS Total Score: 2  Musculoskeletal: Strength & Muscle Tone: within normal limits Gait & Station: normal Patient leans: N/A  Psychiatric Specialty Exam: Physical Exam  Nursing note and vitals reviewed. Constitutional: He is oriented to person, place, and time. He appears well-developed and well-nourished.  HENT:  Head: Normocephalic and atraumatic.  Respiratory: Effort  normal.  Neurological: He is alert and oriented to person, place, and time.    ROS  Blood pressure 135/84, pulse 99, temperature 98.7 F (37.1 C), temperature source Oral, resp. rate 16, height 5\' 8"  (1.727 m), weight 68 kg (150 lb).Body mass index is 22.81 kg/m.  General Appearance: Disheveled  Eye Contact:  Fair  Speech:  Normal Rate  Volume:  Normal  Mood:  Anxious  Affect:  Congruent  Thought Process:  Coherent  Orientation:  Full (Time, Place, and Person)  Thought Content:  Delusions  Suicidal Thoughts:  No  Homicidal Thoughts:  No  Memory:  Immediate;   Fair Recent;   Fair Remote;   Fair  Judgement:  Impaired  Insight:  Lacking  Psychomotor Activity:  Decreased  Concentration:  Concentration: Fair and Attention Span: Fair  Recall:  Poor  Fund of Knowledge:  Fair  Language:  Good  Akathisia:  Negative  Handed:  Right  AIMS (if indicated):     Assets:  Desire for Improvement Resilience Social Support  ADL's:  Intact  Cognition:  WNL  Sleep:  Number of Hours: 6.75     Treatment Plan Summary: Daily contact with patient to assess and evaluate symptoms and progress in treatment, Medication management and Plan Patient is seen and examined.  Patient is a 32 year old male with the above-stated past psychiatric history seen in follow-up.  I am  going to leave the paliperidone at 6 mg p.o. nightly.  We are awaiting for the injection to finally kick in.  As soon as possible we will reduce the oral paliperidone and hopefully eliminate it.  He now believes that the gabapentin is making his anxiety worse.  I will reduce that dosage today.  He does seem somewhat improved today.  His affect is a little bit more pleasant, less irritable, but still having delusional thinking.  No other changes today.  Antonieta Pert, MD 05/12/2018, 2:30 PM

## 2018-05-12 NOTE — Progress Notes (Signed)
Patient ID: Maurice AcostaBenjamin V [name], male   DOB: 27-Oct-1986, 32 y.o.   MRN: [id number]  Nursing Progress Note [id number]  Data: Patient presents with flat affect and depressed mood. Patient is seen isolating to his room but did go to the cafeteria for both lunch and dinner. Patient reported adverse reactions to Gabapentin; MD notified. Patient took his scheduled Buspar and was educated about new medications. Patient provided but declined to complete their self-inventory sheet. Patient currently denies SI/HI/AVH. Patient continues to report social anxiety and discomfort when attending meals.  Action: Patient educated about and provided medication per provider's orders. Patient safety maintained with q15 min safety checks and frequent rounding. Low fall risk precautions in place. Emotional support given. 1:1 interaction and active listening provided. Patient encouraged to attend meals and groups. Patient encouraged to work on treatment plan and goals. Labs, vital signs and patient behavior monitored throughout shift.   Response: Patient agrees to come to staff if any thoughts of SI/HI develop or if patient develops intention of acting on thoughts. Patient remains safe on the unit at this time. Patient is interacting with peers appropriately on the unit. Will continue to support and monitor.

## 2018-05-13 MED ORDER — PALIPERIDONE PALMITATE ER 156 MG/ML IM SUSY
156.0000 mg | PREFILLED_SYRINGE | Freq: Once | INTRAMUSCULAR | Status: DC
  Filled 2018-05-13: qty 1

## 2018-05-13 MED ORDER — GABAPENTIN 400 MG PO CAPS
400.0000 mg | ORAL_CAPSULE | Freq: Three times a day (TID) | ORAL | Status: DC | PRN
Start: 2018-05-13 — End: 2018-05-17
  Administered 2018-05-13 – 2018-05-14 (×2): 400 mg via ORAL
  Filled 2018-05-13: qty 1

## 2018-05-13 MED ORDER — FLUOXETINE HCL 20 MG PO CAPS
20.0000 mg | ORAL_CAPSULE | Freq: Every day | ORAL | Status: DC
  Administered 2018-05-13 – 2018-05-17 (×5): 20 mg via ORAL
  Filled 2018-05-13 (×8): qty 1

## 2018-05-13 NOTE — Progress Notes (Addendum)
[facility] MD Progress Note  05/13/2018 3:44 PM Maurice [name]  MRN:  [id number] Subjective: Patient is seen and examined.  Patient is a 32 year old male with a past psychiatric history significant for psychosis, delusions, hallucinations and a traumatic brain injury.  Diagnosis is bipolar disorder with psychotic features versus schizophrenia.  He is seen in follow-up.  He has continued to complain of anxiety, and now thinks that the gabapentin is what is causing his heart to flutter and worsen his anxiety.  He refused gabapentin today. He is interested in taking a once a day medication for anxiety and mood, and is agreeable to Prozac 20 mg.  He denied any auditory or visual hallucinations and does not appear to be responding to any internal stimuli.  today.  His delusions appear to be less disturbing to him today.  He did take 6 mg of Invega by mouth last night and he stated did help with sleep. [name] reports a good appetite and states he is resting well.Patient is visible on the milieu.  Patient seen attending group sessions with active and engaged participation. He was able to go off the unit for recreational therapy to the gym and to the cafeteria.  Angelica Wix Herrera has agreed to continue his current plan of care already in progress. He denies any other issues or concerns. Support encouragement reassurance was provided.  Spoke with mother by phone x 60 minutes to review medications, plan of care and pending discharge.  All questions answered and reassurance provided.   Principal Problem: Bipolar I disorder, current or most recent episode manic, with psychotic features (HCC) Diagnosis:   Patient Active Problem List   Diagnosis Date Noted  . Acute psychosis (HCC) [F23]   . Bipolar I disorder, current or most recent episode manic, with psychotic features (HCC) [F31.2] 04/21/2018  . Suicidal ideations [R45.851]   . Polysubstance (including opioids) dependence with physiological dependence (HCC) [F19.20]  04/02/2018  . 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 Delusions (HCC) [F22]    Total Time spent with patient: 1 hour  Past Psychiatric History: See admission H&P  Past Medical History:  Past Medical History:  Diagnosis Date  . Anxiety   . Substance abuse (HCC)    History reviewed. No pertinent surgical history. Family History: History reviewed. No pertinent family history. Family Psychiatric  History: See admission H&P Social History:  Social History   Substance and Sexual Activity  Alcohol Use Not Currently  . Alcohol/week: 0.0 oz     Social History   Substance and Sexual Activity  Drug Use No    Social History   Socioeconomic History  . Marital status: Single    Spouse name: Not on file  . Number of children: Not on file  . Years of education: Not on file  . Highest education level: Not on file  Occupational History  . Not on file  Social Needs  . Financial resource strain: Not on file  . Food insecurity:    Worry: Not on file    Inability: Not on file  . Transportation needs:    Medical: Not on file    Non-medical: Not on file  Tobacco Use  . Smoking status: Former Games developer  . Smokeless tobacco: Never Used  Substance and Sexual Activity  . Alcohol use: Not Currently    Alcohol/week: 0.0 oz  . Drug use: No  . Sexual activity: Not on file  Lifestyle  . Physical activity:    Days per week: Not  on file    Minutes per session: Not on file  . Stress: Not on file  Relationships  . Social connections:    Talks on phone: Not on file    Gets together: Not on file    Attends religious service: Not on file    Active member of club or organization: Not on file    Attends meetings of clubs or organizations: Not on file    Relationship status: Not on file  Other Topics Concern  . Not on file  Social History Narrative  . Not on file   Additional Social History:                         Sleep: Good  Appetite:   Good  Current Medications: Current Facility-Administered Medications  Medication Dose Route Frequency Provider Last Rate Last Dose  . acetaminophen (TYLENOL) tablet 650 mg  650 mg Oral Q6H PRN Laveda AbbeParks, Laurie Britton, NP      . alum & mag hydroxide-simeth (MAALOX/MYLANTA) 200-200-20 MG/5ML suspension 30 mL  30 mL Oral Q4H PRN Laveda AbbeParks, Laurie Britton, NP      . busPIRone (BUSPAR) tablet 5 mg  5 mg Oral BID Antonieta Pertlary, Greg Lawson, MD   5 mg at 05/13/18 0757  . FLUoxetine (PROZAC) capsule 20 mg  20 mg Oral Daily Mariel CraftMaurer, Sheila M, MD   20 mg at 05/13/18 1053  . gabapentin (NEURONTIN) capsule 400 mg  400 mg Oral TID PRN Mariel CraftMaurer, Sheila M, MD   400 mg at 05/13/18 1054  . hydrOXYzine (ATARAX/VISTARIL) tablet 25 mg  25 mg Oral Q6H PRN Micheal Likensainville, Christopher T, MD      . ibuprofen (ADVIL,MOTRIN) tablet 600 mg  600 mg Oral Q6H PRN Micheal Likensainville, Christopher T, MD   600 mg at 05/11/18 2037  . LORazepam (ATIVAN) tablet 0.5 mg  0.5 mg Oral Q6H PRN Micheal Likensainville, Christopher T, MD   0.5 mg at 05/12/18 2110  . magnesium hydroxide (MILK OF MAGNESIA) suspension 30 mL  30 mL Oral Daily PRN Laveda AbbeParks, Laurie Britton, NP      . OLANZapine zydis (ZYPREXA) disintegrating tablet 5 mg  5 mg Oral Q8H PRN Micheal Likensainville, Christopher T, MD   5 mg at 05/12/18 [id number]   And  . ziprasidone (GEODON) injection 20 mg  20 mg Intramuscular PRN Micheal Likensainville, Christopher T, MD      . ondansetron (ZOFRAN) tablet 4 mg  4 mg Oral Q8H PRN Micheal Likensainville, Christopher T, MD      . Melene Muller[START ON 05/16/2018] paliperidone (INVEGA SUSTENNA) injection 156 mg  156 mg Intramuscular Once Mariel CraftMaurer, Sheila M, MD      . paliperidone (INVEGA) 24 hr tablet 6 mg  6 mg Oral QHS Antonieta Pertlary, Greg Lawson, MD   6 mg at 05/12/18 2110  . simethicone (MYLICON) chewable tablet 160 mg  160 mg Oral Q6H PRN Nira ConnBerry, Jason A, NP      . traZODone (DESYREL) tablet 100 mg  100 mg Oral QHS PRN Cobos, Rockey SituFernando A, MD   100 mg at 05/12/18 2110    Lab Results: No results found for this or any previous visit (from the  past 48 hour(s)).  Blood Alcohol level:  Lab Results  Component Value Date   ETH <10 04/20/2018    Metabolic Disorder Labs: Lab Results  Component Value Date   HGBA1C 5.6 05/08/2018   MPG 114.02 05/08/2018   Lab Results  Component Value Date   PROLACTIN 43.7 (H) 05/07/2018  Lab Results  Component Value Date   CHOL 141 05/08/2018   TRIG 210 (H) 05/08/2018   HDL 40 (L) 05/08/2018   CHOLHDL 3.5 05/08/2018   VLDL 42 (H) 05/08/2018   LDLCALC 59 05/08/2018    Physical Findings: AIMS: Facial and Oral Movements Muscles of Facial Expression: None, normal Lips and Perioral Area: None, normal Jaw: None, normal Tongue: None, normal,Extremity Movements Upper (arms, wrists, hands, fingers): None, normal Lower (legs, knees, ankles, toes): None, normal, Trunk Movements Neck, shoulders, hips: None, normal, Overall Severity Severity of abnormal movements (highest score from questions above): None, normal Incapacitation due to abnormal movements: None, normal Patient's awareness of abnormal movements (rate only patient's report): No Awareness, Dental Status Current problems with teeth and/or dentures?: No Does patient usually wear dentures?: No  CIWA:  CIWA-Ar Total: 1 COWS:  COWS Total Score: 2  Musculoskeletal: Strength & Muscle Tone: within normal limits Gait & Station: normal Patient leans: N/A  Psychiatric Specialty Exam: Physical Exam  Nursing note and vitals reviewed. Constitutional: He is oriented to person, place, and time. He appears well-developed and well-nourished.  HENT:  Head: Normocephalic and atraumatic.  Respiratory: Effort normal.  Neurological: He is alert and oriented to person, place, and time.    ROS  Blood pressure (!) 139/94, pulse (!) 120, temperature 97.6 F (36.4 C), temperature source Oral, resp. rate 16, height 5\' 8"  (1.727 m), weight 68 kg (150 lb).Body mass index is 22.81 kg/m.  General Appearance: Disheveled  Eye Contact:  Fair  Speech:   Normal Rate  Volume:  Normal  Mood:  Anxious  Affect:  Congruent  Thought Process:  Coherent  Orientation:  Full (Time, Place, and Person)  Thought Content:  Delusions  Suicidal Thoughts:  No  Homicidal Thoughts:  No  Memory:  Immediate;   Fair Recent;   Fair Remote;   Fair  Judgement:  Impaired  Insight:  Lacking  Psychomotor Activity:  Decreased  Concentration:  Concentration: Fair and Attention Span: Fair  Recall:  Poor  Fund of Knowledge:  Fair  Language:  Good  Akathisia:  Negative  Handed:  Right  AIMS (if indicated):     Assets:  Desire for Improvement Resilience Social Support  ADL's:  Intact  Cognition:  WNL  Sleep:  Number of Hours: 4     Treatment Plan Summary: Daily contact with patient to assess and evaluate symptoms and progress in treatment, Medication management and Plan Patient is seen and examined.  Patient is a 32 year old male with the above-stated past psychiatric history seen in follow-up.  I am going to leave the paliperidone at 6 mg p.o. nightly.  We are awaiting for the injection to finally kick in, and second dose has been ordered to be given on 05/16/2018.  As soon as possible we will reduce the oral paliperidone and hopefully eliminate it.  He now believes that the gabapentin is making his anxiety worse. I have changed that to PRN.  He was agreeable to starting Prozac 20 mg PO for anxiety and mood, and first dose was provided today.  He does seem somewhat improved today.  His affect is a little bit more pleasant, less irritable, with less delusional thinking. He has been able to attend groups on and off the unit.  No other changes today.  Mariel Craft, MD 05/13/2018, 3:44 PM

## 2018-05-13 NOTE — Progress Notes (Signed)
Patient's mom called the unit extremely concerned about patient. Patient's mom was hysterically crying and upset. MD then called patient's mom and explained the plan of care. Patient's mom was satisfied.

## 2018-05-13 NOTE — [facility] Group Notes (Signed)
Patient did not attend Orientation and Goals group. 

## 2018-05-13 NOTE — Progress Notes (Signed)
Patient claimed he slept "ok" last night. Patient refused Gabapentin this morning because he stated "My heart is racing too fast, I'm going to pass." Patient then talked with MD  And took his new medication, Prozac, along with Gabapentin. Patient stated he was feeling anxious. Patient did not go to breakfast this morning, but did go downstairs to the gym for rec. therapy.  Safety maintained with 15 minute checks. Will continue to monitor. Support and encouragement given.

## 2018-05-13 NOTE — Plan of Care (Signed)
  Problem: Education: Goal: Knowledge of [facility] Education information/materials will improve Outcome: Progressing Goal: Verbalization of understanding the information provided will improve Outcome: Progressing   Problem: Activity: Goal: Interest or engagement in activities will improve Outcome: Progressing Goal: Sleeping patterns will improve Outcome: Progressing   Problem: Coping: Goal: Ability to demonstrate self-control will improve Outcome: Progressing   Problem: Health Behavior/Discharge Planning: Goal: Identification of resources available to assist in meeting health care needs will improve Outcome: Progressing Goal: Compliance with treatment plan for underlying cause of condition will improve Outcome: Progressing   Problem: Physical Regulation: Goal: Ability to maintain clinical measurements within normal limits will improve Outcome: Progressing   Problem: Safety: Goal: Periods of time without injury will increase Outcome: Progressing   Problem: Activity: Goal: Will verbalize the importance of balancing activity with adequate rest periods Outcome: Progressing   Problem: Education: Goal: Knowledge of the prescribed therapeutic regimen will improve Outcome: Progressing   Problem: Coping: Goal: Coping ability will improve Outcome: Progressing   Problem: Health Behavior/Discharge Planning: Goal: Compliance with prescribed medication regimen will improve Outcome: Progressing   Problem: Role Relationship: Goal: Ability to communicate needs accurately will improve Outcome: Progressing Goal: Ability to interact with others will improve Outcome: Progressing   Problem: Safety: Goal: Ability to redirect hostility and anger into socially appropriate behaviors will improve Outcome: Progressing Goal: Ability to remain free from injury will improve Outcome: Progressing   Problem: Self-Concept: Goal: Ability to disclose and discuss suicidal ideas will  improve Outcome: Progressing Goal: Will verbalize positive feelings about self Outcome: Progressing   Problem: Education: Goal: Ability to verbalize precipitating factors for violent behavior will improve Outcome: Progressing   Problem: Coping: Goal: Ability to verbalize frustrations and anger appropriately will improve Outcome: Progressing   Problem: Health Behavior/Discharge Planning: Goal: Ability to implement measures to prevent violent behavior in the future will improve Outcome: Progressing   Problem: Safety: Goal: Ability to demonstrate self-control will improve Outcome: Progressing

## 2018-05-13 NOTE — Progress Notes (Signed)
Recreation Therapy Notes  Date: 7.5.19 Time: 1000 Location: 500 Hall Dayroom  Group Topic: Stress Management  Goal Area(s) Addresses:  Patient will verbalize importance of using healthy stress management.  Patient will identify positive emotions associated with healthy stress management.   Intervention: Stress Management  Activity : Coloring.  LRT introduced stress management to group.  Patients were given mandala coloring pages.  Patients were to color the drawings as soft music played in the background.  Education:  Stress Management, Discharge Planning.   Education Outcome: Acknowledges edcuation/In group clarification offered/Needs additional education  Clinical Observations/Feedback:  Pt did not attend group.    Caroll RancherMarjette Kathleena Freeman, LRT/CTRS         Lillia AbedLindsay, Jamillah Camilo A 05/13/2018 11:26 AM

## 2018-05-13 NOTE — Progress Notes (Signed)
Patient stated that he went to the gym earlier in the day and enjoyed himself. His goal for tomorrow is to work on his "social anxiety".

## 2018-05-14 MED ORDER — PROPRANOLOL HCL 10 MG PO TABS
10.0000 mg | ORAL_TABLET | Freq: Three times a day (TID) | ORAL | Status: DC
  Administered 2018-05-14 – 2018-05-17 (×10): 10 mg via ORAL
  Filled 2018-05-14 (×16): qty 1

## 2018-05-14 NOTE — [facility] Group Notes (Signed)
Adult Psychoeducational Group Note  Date:  05/14/2018 Time:  10:02 PM  Group Topic/Focus:  Wrap-Up Group:   The focus of this group is to help patients review their daily goal of treatment and discuss progress on daily workbooks.   Participation Level:  Minimal  Participation Quality:  Appropriate  Affect:  Anxious  Cognitive:  Appropriate  Insight: Improving  Engagement in Group:  Improving  Modes of Intervention:  Discussion  Additional Comments:  Patient attended group. Patient stated that today was his second group. He wants to continue to engage with peers and attend groups.  Lyndee HensenGoins, Deandra Goering R 05/14/2018, 10:02 PM

## 2018-05-14 NOTE — Progress Notes (Signed)
Pt has visit from mom and dad.  Pt is alert and responds to questions stating he feels much better since arrival.  Pt thinks his depression is decreasing and asks when he thinks he will be d/c.   Pt feels a bit claustrophobic and wants to discharge as soon as possible.  Pt encouraged to attend group therapy.  Pt denies SI, HI and AVH.  Pt contracts for safety.  Pt asks for something for sleep.   Pt attends group, and participates.  Pt given prn medication to help with sleep. Pt remains safe on unit with q 15 min checks.

## 2018-05-14 NOTE — [facility] Group Notes (Signed)
Patient did not attend group.

## 2018-05-14 NOTE — Progress Notes (Signed)
Patient ID: Maurice [name], male   DOB: November 08, 1986, 32 y.o.   MRN: [id number] DAR Note: Pt observed racing up and down his room. Pt at assessment endorsed severe anxiety and insomnia, "I'm super nervous right night because I have not sleep properly in a few days." Pt denied pain, SI, HI, AVH or depression. Medications offered as prescribed. All patient's questions and concerns addressed. Support, encouragement, and safe environment provide. Pt was med compliant. Safety checks continue.

## 2018-05-14 NOTE — [facility] Group Notes (Signed)
[facility] LCSW Group Therapy Note  Date/Time:  05/14/2018  11:00AM-12:00PM  Type of Therapy and Topic:  Group Therapy:  Music and Mood  Participation Level:  Minimal   Description of Group: In this process group, members listened to a variety of genres of music and identified that different types of music evoke different responses.  Patients were encouraged to identify music that was soothing for them and music that was energizing for them.  Patients discussed how this knowledge can help with wellness and recovery in various ways including managing depression and anxiety as well as encouraging healthy sleep habits.    Therapeutic Goals: 1. Patients will explore the impact of different varieties of music on mood 2. Patients will verbalize the thoughts they have when listening to different types of music 3. Patients will identify music that is soothing to them as well as music that is energizing to them 4. Patients will discuss how to use this knowledge to assist in maintaining wellness and recovery 5. 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  He enjoyed the session and each type of music played.  Therapeutic Modalities: Solution Focused Brief Therapy Activity   Ambrose MantleMareida Grossman-Orr, LCSW

## 2018-05-14 NOTE — Plan of Care (Signed)
Patient refused to fill out self inventory, and continuing to isolate to room He slept well last night, napped during the day yesterday. Continues to act paranoid.

## 2018-05-14 NOTE — Progress Notes (Signed)
D: Patient continues to remain paranoid, isolative, ambivalent. He said "I need to think about it" when asked if he would take his medications this morning. He did eventually agree to take them without further prompting. "I wasn't sure if I was going to take them, but I decided to." He is wringing his hands, guarded and visibly anxious. BP and HR elevated, and provider Aggie N. NP notified and started propranolol.  A: Patient checked q15 min, and checks reviewed. Reviewed medication changes with patient and educated on side effects. Educated patient on importance of attending group therapy sessions and educated on several coping skills. Encouarged participation in milieu through recreation therapy and attending meals with peers.  R: Patient receptive to education on medications, and is medication compliant. Patient contracts for safety on the unit.

## 2018-05-14 NOTE — Progress Notes (Addendum)
[facility] MD Progress Note  05/14/2018 1:48 PM GAIL CREEKMORE  MRN:  [id number]  Subjective: Maurice [name] reports, "It is going well today. I'm starting to feel claustrophobic here. I have been here too long. My mood is better. I'm not hearing any voices or seeing things. The anxiety is better. I went to group the first time last night. I feel good".     Aayush Gelpi is a 32 y/o M with history of treatment for depression, ADHD, and polysubstance abuse who was admitted from [facility] on IVC initiated by his sister due to worsening symptoms of psychosis and depression including reporting that he has been telepathically communicating with [employer] and Beverly Sessions as well reporting that they have been telling him to kill himself. Pt also reportedly asked his family for a gun. Pt has recent history of concussion about 2 weeks ago and he has continued to have vertigo from that incident (Head CT was negative for acute intracranial abnormality). Pt was medically stabilized and then transferred to [facility] for additional treatment and stabilization.He was started on trial of abilify and gabapentin, and he initially was refusing those medication. He barricaded himself in his room on 6/15, and he required IM medications to manage his agitation. Second opinion was obtained for forced medications; however, ptwasinitiallytaking abilify. Pt reported ongoing symptoms of AH, and he requested change of medications, and he was started on trial of Invega with potential plan to transition to long-acting injectable form; however, he has been declining to have long-acting injectable form. Pt reported some daytime sedation which was causing him to refuse oral form of Invega, and his dose was reduced. Pthad taken lower dose of Invega once, but then he began to refuse it again. Pt was discontinued from [place], and he was started on trial of zyprexa.  Today Maurice [name] is seen, chart reviewed. The chart findings discussed with the treatment. He is  visible on the unit, attending group sessions.  He denies SI/HI/AH/VH. He denies any new issues or concerns. He has agreed to continue current plan of care already in progress.  Principal Problem: Bipolar I disorder, current or most recent episode manic, with psychotic features (HCC) Diagnosis:   Patient Active Problem List   Diagnosis Date Noted  . Acute psychosis (HCC) [F23]   . Bipolar I disorder, current or most recent episode manic, with psychotic features (HCC) [F31.2] 04/21/2018  . Suicidal ideations [R45.851]   . Polysubstance (including opioids) dependence with physiological dependence (HCC) [F19.20] 04/02/2018  . 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 Delusions (HCC) [F22]    Total Time spent with patient: 15 minutes  Past Psychiatric History: See H&P  Past Medical History:  Past Medical History:  Diagnosis Date  . Anxiety   . Substance abuse (HCC)    History reviewed. No pertinent surgical history. Family History: History reviewed. No pertinent family history. Family Psychiatric  History: see H&p Social History:  Social History   Substance and Sexual Activity  Alcohol Use Not Currently  . Alcohol/week: 0.0 oz     Social History   Substance and Sexual Activity  Drug Use No    Social History   Socioeconomic History  . Marital status: Single    Spouse name: Not on file  . Number of children: Not on file  . Years of education: Not on file  . Highest education level: Not on file  Occupational History  . Not on file  Social Needs  . Financial resource strain: Not on file  .  Food insecurity:    Worry: Not on file    Inability: Not on file  . Transportation needs:    Medical: Not on file    Non-medical: Not on file  Tobacco Use  . Smoking status: Former Games developer  . Smokeless tobacco: Never Used  Substance and Sexual Activity  . Alcohol use: Not Currently    Alcohol/week: 0.0 oz  . Drug use: No  . Sexual activity: Not on file   Lifestyle  . Physical activity:    Days per week: Not on file    Minutes per session: Not on file  . Stress: Not on file  Relationships  . Social connections:    Talks on phone: Not on file    Gets together: Not on file    Attends religious service: Not on file    Active member of club or organization: Not on file    Attends meetings of clubs or organizations: Not on file    Relationship status: Not on file  Other Topics Concern  . Not on file  Social History Narrative  . Not on file   Additional Social History:                         Sleep: Good  Appetite:  Good  Current Medications: Current Facility-Administered Medications  Medication Dose Route Frequency Provider Last Rate Last Dose  . acetaminophen (TYLENOL) tablet 650 mg  650 mg Oral Q6H PRN Laveda Abbe, NP      . alum & mag hydroxide-simeth (MAALOX/MYLANTA) 200-200-20 MG/5ML suspension 30 mL  30 mL Oral Q4H PRN Laveda Abbe, NP      . busPIRone (BUSPAR) tablet 5 mg  5 mg Oral BID Antonieta Pert, MD   5 mg at 05/14/18 0800  . FLUoxetine (PROZAC) capsule 20 mg  20 mg Oral Daily Mariel Craft, MD   20 mg at 05/14/18 0800  . gabapentin (NEURONTIN) capsule 400 mg  400 mg Oral TID PRN Mariel Craft, MD   400 mg at 05/14/18 0801  . hydrOXYzine (ATARAX/VISTARIL) tablet 25 mg  25 mg Oral Q6H PRN Micheal Likens, MD      . ibuprofen (ADVIL,MOTRIN) tablet 600 mg  600 mg Oral Q6H PRN Micheal Likens, MD   600 mg at 05/11/18 2037  . LORazepam (ATIVAN) tablet 0.5 mg  0.5 mg Oral Q6H PRN Micheal Likens, MD   0.5 mg at 05/13/18 2128  . magnesium hydroxide (MILK OF MAGNESIA) suspension 30 mL  30 mL Oral Daily PRN Laveda Abbe, NP      . OLANZapine zydis (ZYPREXA) disintegrating tablet 5 mg  5 mg Oral Q8H PRN Micheal Likens, MD   5 mg at 05/12/18 4403   And  . ziprasidone (GEODON) injection 20 mg  20 mg Intramuscular PRN Micheal Likens, MD       . ondansetron (ZOFRAN) tablet 4 mg  4 mg Oral Q8H PRN Micheal Likens, MD      . Melene Muller ON 05/16/2018] paliperidone (INVEGA SUSTENNA) injection 156 mg  156 mg Intramuscular Once Mariel Craft, MD      . paliperidone (INVEGA) 24 hr tablet 6 mg  6 mg Oral QHS Antonieta Pert, MD   6 mg at 05/13/18 2128  . propranolol (INDERAL) tablet 10 mg  10 mg Oral TID Armandina Stammer I, NP   10 mg at 05/14/18 1058  . simethicone (MYLICON) chewable  tablet 160 mg  160 mg Oral Q6H PRN Nira Conn A, NP      . traZODone (DESYREL) tablet 100 mg  100 mg Oral QHS PRN Cobos, Rockey Situ, MD   100 mg at 05/13/18 2128   Lab Results: No results found for this or any previous visit (from the past 48 hour(s)).  Blood Alcohol level:  Lab Results  Component Value Date   ETH <10 04/20/2018    Metabolic Disorder Labs: Lab Results  Component Value Date   HGBA1C 5.6 05/08/2018   MPG 114.02 05/08/2018   Lab Results  Component Value Date   PROLACTIN 43.7 (H) 05/07/2018   Lab Results  Component Value Date   CHOL 141 05/08/2018   TRIG 210 (H) 05/08/2018   HDL 40 (L) 05/08/2018   CHOLHDL 3.5 05/08/2018   VLDL 42 (H) 05/08/2018   LDLCALC 59 05/08/2018   Physical Findings: AIMS: Facial and Oral Movements Muscles of Facial Expression: None, normal Lips and Perioral Area: None, normal Jaw: None, normal Tongue: None, normal,Extremity Movements Upper (arms, wrists, hands, fingers): None, normal Lower (legs, knees, ankles, toes): None, normal, Trunk Movements Neck, shoulders, hips: None, normal, Overall Severity Severity of abnormal movements (highest score from questions above): None, normal Incapacitation due to abnormal movements: None, normal Patient's awareness of abnormal movements (rate only patient's report): No Awareness, Dental Status Current problems with teeth and/or dentures?: No Does patient usually wear dentures?: No  CIWA:  CIWA-Ar Total: 0 COWS:  COWS Total Score:  4  Musculoskeletal: Strength & Muscle Tone: within normal limits Gait & Station: normal Patient leans: N/A  Psychiatric Specialty Exam: Physical Exam  Nursing note and vitals reviewed.   Review of Systems  Unable to perform ROS: Psychiatric disorder  Psychiatric/Behavioral: Positive for hallucinations (Hx. psychosis) and substance abuse (Hx. Benzodiazpeine). Negative for depression, memory loss and suicidal ideas. The patient is not nervous/anxious and does not have insomnia.     Blood pressure (!) 128/93, pulse (!) 121, temperature 98.3 F (36.8 C), temperature source Oral, resp. rate 20, height 5\' 8"  (1.727 m), weight 68 kg (150 lb).Body mass index is 22.81 kg/m.  General Appearance: Casual and Fairly Groomed  Eye Contact:  Good  Speech:  Clear and Coherent and Normal Rate  Volume:  Normal  Mood:  "Improving"  Affect:  Restricted  Thought Process:  Coherent, Goal Directed and Descriptions of Associations: Intact  Orientation:  Full (Time, Place, and Person)  Thought Content:  Logical  Suicidal Thoughts:  Denies  Homicidal Thoughts:  Denies  Memory:  Immediate;   Good Recent;   Good Remote;   Good  Judgement:  Other:  Improved  Insight:  Fair and Lacking  Psychomotor Activity:  Normal  Concentration:  Concentration: Fair  Recall:  [employer] of Knowledge:  Fair  Language:  Fair  Akathisia:  Negative  Handed:    AIMS (if indicated):     Assets:  Communication Skills Desire for Improvement Resilience Social Support  ADL's:  Intact  Cognition:  WNL  Sleep:  Number of Hours: 6.25   Treatment Plan Summary: Daily contact with patient to assess and evaluate symptoms and progress in treatment and Medication management   -Continue inpatient hospitalization.  -Will continue today 05/14/2018 plan as below except where it is noted.  -Bipolar I, current episode manic with psychotic features - Continue paliperidone (Invega) 6 mg po Q hs             - Continue  Invega  Sustenna 156 mg/ML Q 28 days (due on 05-16-18).  -Anxiety  -Continue vistaril 25 mg po q6h prn anxiety -Continue Gabapentin 400mg  po TID.            -Continue Buspar 5 mg po bid.            - Initiated Propranolol 10 mg po tid for elevated pulse rate.  Depression.             - Continue Prozac 20 mg po daily.             -Agitation -Continue zydis 5 mg po q8h prn agitation/severe anxiety -Continue geodon 20mg  IM q12h prn severe agitation and unable to take oral medication  -Insomnia  -Continuetrazodone 100 mg po qhs PRN.  -Encourage participation in groups and therapeutic milieu  -Disposition planning will be ongoing    Armandina StammerAgnes Nwoko, NP 05/14/2018, 1:48 PMPatient ID: Maurice [name], Maurice [name]   DOB: 1986/09/01, 32 y.o.   MRN: [id number]

## 2018-05-15 MED ORDER — BENZTROPINE MESYLATE 1 MG PO TABS
1.0000 mg | ORAL_TABLET | Freq: Two times a day (BID) | ORAL | Status: DC
  Administered 2018-05-15 – 2018-05-16 (×2): 1 mg via ORAL
  Filled 2018-05-15 (×6): qty 1

## 2018-05-15 MED ORDER — BENZTROPINE MESYLATE 1 MG PO TABS
1.0000 mg | ORAL_TABLET | Freq: Two times a day (BID) | ORAL | Status: DC
  Filled 2018-05-15: qty 1

## 2018-05-15 MED ORDER — TRAZODONE HCL 100 MG PO TABS
200.0000 mg | ORAL_TABLET | Freq: Every evening | ORAL | Status: DC | PRN
  Administered 2018-05-15 – 2018-05-16 (×2): 200 mg via ORAL
  Filled 2018-05-15 (×2): qty 2

## 2018-05-15 MED ORDER — PALIPERIDONE PALMITATE ER 156 MG/ML IM SUSY
156.0000 mg | PREFILLED_SYRINGE | Freq: Once | INTRAMUSCULAR | Status: AC
  Administered 2018-05-16: 156 mg via INTRAMUSCULAR

## 2018-05-15 MED ORDER — BENZTROPINE MESYLATE 1 MG PO TABS
1.0000 mg | ORAL_TABLET | Freq: Every day | ORAL | Status: DC
  Administered 2018-05-15: 1 mg via ORAL
  Filled 2018-05-15 (×4): qty 1

## 2018-05-15 MED ORDER — TRAZODONE HCL 150 MG PO TABS: 150.0000 mg | ORAL_TABLET | Freq: Every evening | ORAL | Status: DC | PRN

## 2018-05-15 NOTE — Progress Notes (Signed)
Patient was noted to be psychomotor retarded this shift.  These concerns were addressed with the NP who monitored patient.  Patient has been able to attend groups and engage in recreation activity.   Assess patient for safety, offer medications as prescribed, engage in 1:1 staff talks.   Patient was able to contract for safety.  Continue to monitor as planned.

## 2018-05-15 NOTE — Progress Notes (Signed)
D: Patient presents anxious, guarded, flat. He is improved since admission, and is now medication compliant. He still has some paranoia about his medications, such as changes in the manufacturer/appearance of the tablet. He denies SI/HI/AVH. His sleep was fair last night, and he received medication to help with sleep. His appetite is fair, energy normal and concentration good. He rates his depression and feeling of hopelessness 0/10. His anxiety is 4/10. He is still somatic, and complains of agitation and a runny nose.  A: Patient checked q15 min, and checks reviewed. Reviewed medication changes with patient and educated on side effects. Educated patient on importance of attending group therapy sessions and educated on several coping skills. Encouarged participation in milieu through recreation therapy and attending meals with peers. Support and encouragement provided. Fluids offered. R: Patient receptive to education on medications, and is medication compliant. Patient contracts for safety on the unit.

## 2018-05-15 NOTE — Progress Notes (Signed)
[facility] MD Progress Note  05/15/2018 1:47 PM Maurice [name]  MRN:  [id number]  Subjective: Maurice [name] reports, "I'm feeling very anxious today. I feel too anxious to sit at the day room, I feel too anxious to lie down in my bed. The nurse told me that I may not go home tomorrow because there is a blood to be drawn. I'm shaking badly from anxiety. My joints feel tightt & stiff. But, I'm not depressed. I was feeling real good yesterday".   Maurice [name] is a 32 y/o M with history of treatment for depression, ADHD, and polysubstance abuse who was admitted from [facility] on IVC initiated by his sister due to worsening symptoms of psychosis and depression including reporting that he has been telepathically communicating with [employer] and Beverly Sessions as well reporting that they have been telling him to kill himself. Pt also reportedly asked his family for a gun. Pt has recent history of concussion about 2 weeks ago and he has continued to have vertigo from that incident (Head CT was negative for acute intracranial abnormality). Pt was medically stabilized and then transferred to [facility] for additional treatment and stabilization.He was started on trial of abilify and gabapentin, and he initially was refusing those medication. He barricaded himself in his room on 6/15, and he required IM medications to manage his agitation. Second opinion was obtained for forced medications; however, ptwasinitiallytaking abilify. Pt reported ongoing symptoms of AH, and he requested change of medications, and he was started on trial of Invega with potential plan to transition to long-acting injectable form; however, he has been declining to have long-acting injectable form. Pt reported some daytime sedation which was causing him to refuse oral form of Invega, and his dose was reduced. Pthad taken lower dose of Invega once, but then he began to refuse it again. Pt was discontinued from [place], and he was started on trial of zyprexa.  Today  Maurice [name] is seen, chart reviewed. The chart findings discussed with the treatment. He is visible on the unit briefly today. He attending group sessions briefly & went back to his room. He is complaining of high anxiety levels today. He says he is unable to sit in the day or lie down peacefully in his bed in his because he is having bad anxiety. He has been medicated with Ativan 0.5 mg as ordered prn. He is started on Cogentin 1 mg po bid for EPS as he is scheduled to receive another dose of his injectable antipsychotic tomorrow 05-16-18. His affect today is restricted. He is still hoping to be discharged in the morning as he says he is starting to feel claustrophobic in this hospital. He denies SI/HI/AH/VH. He has agreed to continue current plan of care already in progress including the cogentin for EPS .  Principal Problem: Bipolar I disorder, current or most recent episode manic, with psychotic features (HCC)  Diagnosis:   Patient Active Problem List   Diagnosis Date Noted  . Acute psychosis (HCC) [F23]   . Bipolar I disorder, current or most recent episode manic, with psychotic features (HCC) [F31.2] 04/21/2018  . Suicidal ideations [R45.851]   . Polysubstance (including opioids) dependence with physiological dependence (HCC) [F19.20] 04/02/2018  . 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 Delusions (HCC) [F22]    Total Time spent with patient: 15 minutes  Past Psychiatric History: See H&P  Past Medical History:  Past Medical History:  Diagnosis Date  . Anxiety   . Substance abuse (HCC)  History reviewed. No pertinent surgical history.  Family History: History reviewed. No pertinent family history.  Family Psychiatric  History: See H&P  Social History:  Social History   Substance and Sexual Activity  Alcohol Use Not Currently  . Alcohol/week: 0.0 oz     Social History   Substance and Sexual Activity  Drug Use No    Social History   Socioeconomic  History  . Marital status: Single    Spouse name: Not on file  . Number of children: Not on file  . Years of education: Not on file  . Highest education level: Not on file  Occupational History  . Not on file  Social Needs  . Financial resource strain: Not on file  . Food insecurity:    Worry: Not on file    Inability: Not on file  . Transportation needs:    Medical: Not on file    Non-medical: Not on file  Tobacco Use  . Smoking status: Former Games developermoker  . Smokeless tobacco: Never Used  Substance and Sexual Activity  . Alcohol use: Not Currently    Alcohol/week: 0.0 oz  . Drug use: No  . Sexual activity: Not on file  Lifestyle  . Physical activity:    Days per week: Not on file    Minutes per session: Not on file  . Stress: Not on file  Relationships  . Social connections:    Talks on phone: Not on file    Gets together: Not on file    Attends religious service: Not on file    Active member of club or organization: Not on file    Attends meetings of clubs or organizations: Not on file    Relationship status: Not on file  Other Topics Concern  . Not on file  Social History Narrative  . Not on file   Additional Social History:   Sleep: Good  Appetite:  Good  Current Medications: Current Facility-Administered Medications  Medication Dose Route Frequency Provider Last Rate Last Dose  . acetaminophen (TYLENOL) tablet 650 mg  650 mg Oral Q6H PRN Laveda AbbeParks, Laurie Britton, NP      . alum & mag hydroxide-simeth (MAALOX/MYLANTA) 200-200-20 MG/5ML suspension 30 mL  30 mL Oral Q4H PRN Laveda AbbeParks, Laurie Britton, NP      . benztropine (COGENTIN) tablet 1 mg  1 mg Oral Daily Nwoko, Agnes I, NP   1 mg at 05/15/18 1200  . busPIRone (BUSPAR) tablet 5 mg  5 mg Oral BID Antonieta Pertlary, Greg Lawson, MD   5 mg at 05/15/18 [id number]  . FLUoxetine (PROZAC) capsule 20 mg  20 mg Oral Daily Mariel CraftMaurer, Sheila M, MD   20 mg at 05/15/18 [id number]  . gabapentin (NEURONTIN) capsule 400 mg  400 mg Oral TID PRN Mariel CraftMaurer,  Sheila M, MD   400 mg at 05/14/18 0801  . hydrOXYzine (ATARAX/VISTARIL) tablet 25 mg  25 mg Oral Q6H PRN Micheal Likensainville, Christopher T, MD      . ibuprofen (ADVIL,MOTRIN) tablet 600 mg  600 mg Oral Q6H PRN Micheal Likensainville, Christopher T, MD   600 mg at 05/11/18 2037  . LORazepam (ATIVAN) tablet 0.5 mg  0.5 mg Oral Q6H PRN Micheal Likensainville, Christopher T, MD   0.5 mg at 05/15/18 1029  . magnesium hydroxide (MILK OF MAGNESIA) suspension 30 mL  30 mL Oral Daily PRN Laveda AbbeParks, Laurie Britton, NP      . OLANZapine zydis (ZYPREXA) disintegrating tablet 5 mg  5 mg Oral Q8H PRN Micheal Likensainville, Christopher T, MD  5 mg at 05/12/18 0917   And  . ziprasidone (GEODON) injection 20 mg  20 mg Intramuscular PRN Micheal Likens, MD      . ondansetron [facility]) tablet 4 mg  4 mg Oral Q8H PRN Micheal Likens, MD      . Melene Muller ON 05/16/2018] paliperidone (INVEGA SUSTENNA) injection 156 mg  156 mg Intramuscular Once Jolyne Loa T, MD      . paliperidone (INVEGA) 24 hr tablet 6 mg  6 mg Oral QHS Antonieta Pert, MD   6 mg at 05/14/18 2141  . propranolol (INDERAL) tablet 10 mg  10 mg Oral TID Armandina Stammer I, NP   10 mg at 05/15/18 1200  . simethicone (MYLICON) chewable tablet 160 mg  160 mg Oral Q6H PRN Nira Conn A, NP      . traZODone (DESYREL) tablet 100 mg  100 mg Oral QHS PRN Cobos, Rockey Situ, MD   100 mg at 05/14/18 2141   Lab Results: No results found for this or any previous visit (from the past 48 hour(s)).  Blood Alcohol level:  Lab Results  Component Value Date   ETH <10 04/20/2018    Metabolic Disorder Labs: Lab Results  Component Value Date   HGBA1C 5.6 05/08/2018   MPG 114.02 05/08/2018   Lab Results  Component Value Date   PROLACTIN 43.7 (H) 05/07/2018   Lab Results  Component Value Date   CHOL 141 05/08/2018   TRIG 210 (H) 05/08/2018   HDL 40 (L) 05/08/2018   CHOLHDL 3.5 05/08/2018   VLDL 42 (H) 05/08/2018   LDLCALC 59 05/08/2018   Physical Findings: AIMS: Facial and Oral  Movements Muscles of Facial Expression: None, normal Lips and Perioral Area: None, normal Jaw: None, normal Tongue: None, normal,Extremity Movements Upper (arms, wrists, hands, fingers): Moderate Lower (legs, knees, ankles, toes): None, normal, Trunk Movements Neck, shoulders, hips: None, normal, Overall Severity Severity of abnormal movements (highest score from questions above): Moderate Incapacitation due to abnormal movements: None, normal Patient's awareness of abnormal movements (rate only patient's report): Aware, mild distress, Dental Status Current problems with teeth and/or dentures?: No Does patient usually wear dentures?: No  CIWA:  CIWA-Ar Total: 3 COWS:  COWS Total Score: 8  Musculoskeletal: Strength & Muscle Tone: within normal limits Gait & Station: normal Patient leans: N/A  Psychiatric Specialty Exam: Physical Exam  Nursing note and vitals reviewed.   Review of Systems  Unable to perform ROS: Psychiatric disorder  Psychiatric/Behavioral: Positive for hallucinations (Hx. psychosis) and substance abuse (Hx. Benzodiazpeine). Negative for depression, memory loss and suicidal ideas. The patient is not nervous/anxious and does not have insomnia.     Blood pressure 126/86, pulse (!) 126, temperature 98.5 F (36.9 C), temperature source Oral, resp. rate 16, height 5\' 8"  (1.727 m), weight 68 kg (150 lb).Body mass index is 22.81 kg/m.  General Appearance: Casual and Fairly Groomed  Eye Contact:  Good  Speech:  Clear and Coherent and Normal Rate  Volume:  Normal  Mood:  "Improving"  Affect:  Restricted  Thought Process:  Coherent, Goal Directed and Descriptions of Associations: Intact  Orientation:  Full (Time, Place, and Person)  Thought Content:  Logical  Suicidal Thoughts:  Denies  Homicidal Thoughts:  Denies  Memory:  Immediate;   Good Recent;   Good Remote;   Good  Judgement:  Other:  Improved  Insight:  Fair and Lacking  Psychomotor Activity:  Normal   Concentration:  Concentration: Fair  Recall:  Fair  [employer] of Knowledge:  Fair  Language:  Fair  Akathisia:  Negative  Handed:    AIMS (if indicated):     Assets:  Communication Skills Desire for Improvement Resilience Social Support  ADL's:  Intact  Cognition:  WNL  Sleep:  Number of Hours: 6.75   Treatment Plan Summary: Daily contact with patient to assess and evaluate symptoms and progress in treatment and Medication management   -Continue inpatient hospitalization.  -Will continue today 05/15/2018 plan as below except where it is noted.  -Bipolar I disorder, current episode manic with psychotic features - Continue paliperidone (Invega) 6 mg po Q hs             - Continue Invega Sustenna 156 mg/ML Q 28 days (due on 05-16-18).  EPS.             - Initiated Cogentin 1 mg po bid for EPS (Patient is complaining of stiffness to joints).  -Anxiety  -Continue vistaril 25 mg po q6h prn anxiety -Continue Gabapentin 400mg  po TID.            -Continue Buspar 5 mg po bid.            - Continue Propranolol 10 mg po tid for elevated pulse rate & complain of high anxiety level..  Depression.             - Continue Prozac 20 mg po daily.             -Agitation -Continue Zyprexa zydis 5 mg po q8h prn agitation/severe anxiety -Continue Geodon 20mg  IM q12h prn severe agitation and unable to take oral medication  -Insomnia  -Continuetrazodone 100 mg po qhs PRN.  -Encourage participation in groups and therapeutic milieu  -Disposition planning will be ongoing  Armandina Stammer, NP, PMHNP, FNP-BC. 05/15/2018, 1:47 PMPatient ID: [name], male   DOB: 14-Oct-1986, 32 y.o.

## 2018-05-15 NOTE — Plan of Care (Signed)
D: Pt denies SI/HI/AVH. Pt is pleasant and cooperative. Pt stated he was feeling a little better. Pt stated he was experiencing muscle stiffness and was drooling most of the day. Pt Night dose of Invega was held per PA- [employer] FinancialSpencer [name] . Pt was given 1 mg Cogentin and his Trazodone was increased to 200 mg due to pt stating he was not sleeping at night. Pt too his Trazodone and sat up in the dayroom, pt was encouraged to go lay down to help the Trazodone work properly , so pt did lay down a little later.  Pt was visible on the unit pacing at times.   A: Pt was offered support and encouragement. Pt was given scheduled medications. Pt was encourage to attend groups. Q 15 minute checks were done for safety.    R:Pt attends groups and interacts well with peers and staff. Pt is taking medication. Pt receptive to treatment and safety maintained on unit.   Problem: Education: Goal: Emotional status will improve Outcome: Progressing   Problem: Education: Goal: Mental status will improve Outcome: Progressing   Problem: Activity: Goal: Sleeping patterns will improve Outcome: Progressing

## 2018-05-15 NOTE — [facility] Group Notes (Signed)
Patient did not attend group.

## 2018-05-15 NOTE — Progress Notes (Signed)
Adult Psychoeducational Group Note  Date:  05/15/2018 Time:  9:48 PM  Group Topic/Focus:  Wrap-Up Group:   The focus of this group is to help patients review their daily goal of treatment and discuss progress on daily workbooks.  Participation Level:  Active  Participation Quality:  Appropriate  Affect:  Appropriate  Cognitive:  Appropriate  Insight: Appropriate  Engagement in Group:  Engaged  Modes of Intervention:  Discussion  Additional Comments:  Patient attended group and said that his day was a 8.  His coping skills were socializing, playing cards and watching tv.   Muhammed Teutsch W Derhonda Eastlick 05/15/2018, 9:48 PM

## 2018-05-15 NOTE — Plan of Care (Signed)
Patient still displaying paranoia and somatic complaints. He is slowly improving, however. Goal for today "Dealing with anxiety" and to meet this goal "talking."

## 2018-05-15 NOTE — [facility] Group Notes (Signed)
Description of Group:   [facility] LCSW Group Therapy Note  05/15/2018  1:50-2:45PM  Type of Therapy and Topic:  Group Therapy:  Coping with Emotions and exploring supports  Participation Level:  None  Description of Group:  Patients in this group were introduced to the idea of adding a variety of healthy and unhealthy coping to address the various needs in their lives. Patients were asked to first identify how they feel about their hospitalization and what they would rather be doing, discussion was opened to explore how patients can maintain without coming back to the hospital. Patients were asked to name emotions and the unhealthy ways people react to those feelings. Discussion were had related to the three ways we tend to react to emotions (Escape, Explode and Express). Patients discussed what healthy coping skills and supports could be helpful in their recovery and wellness after discharge in order to prevent future hospitalizations. Patients were provided a list of possible supports they could add and encouraged to be open to adding new supports as well as removing the unhealthy supports in their life and unhealthy coping mechanisms. An emphasis was placed on following their discharge plan including therapy and taking their medications.   Therapeutic Goals: 1)  demonstrate the importance of healthy coping and supports  2)  provide education on sources of help and positive coping techniques  3)  identify the patient's current level of coping and current support  4)  elicit commitments to add one healthy support and one new coping skill   Summary of Patient Progress: Patient reported feeling alright and hanging in there. Patient was not in the room long prior to leaving.   Therapeutic Modalities:   Motivational Interviewing Brief Solution-Focused Therapy   Shellia CleverlyStephanie N Cyntia Staley, [place]CS[place]  05/15/2018 3:47 PM

## 2018-05-16 MED ORDER — BENZTROPINE MESYLATE 2 MG PO TABS
2.0000 mg | ORAL_TABLET | Freq: Two times a day (BID) | ORAL | Status: DC
  Administered 2018-05-16 – 2018-05-17 (×2): 2 mg via ORAL
  Filled 2018-05-16: qty 1
  Filled 2018-05-16: qty 2
  Filled 2018-05-16 (×5): qty 1

## 2018-05-16 NOTE — Progress Notes (Signed)
Recreation Therapy Notes  Date: 7.8.19 Time: 1000 Location: 500 [address] Dayroom  Group Topic: Triggers, Decision Making  Goal Area(s) Addresses:  Patient will be able to identify triggers. Patient will identify benefit of facing triggers. Patient will identify benefit of using coping skills for triggers post d/c.   Behavioral Response: Engaged  Intervention:  Worksheet, pencils  Activity: Triggers.  Patients will identify at least three things that trigger them.  Patients will then identify how they can avoid or reduce exposure to triggers and what strategies they can use when they can't avoid the triggers.  Education: Scientist, physiologicalDecision Making, Discharge Planning    Education Outcome: Acknowledges education  Clinical Observations/Feedback: Pt was quiet but engaged when prompted.  Pt identified his triggers as social situations, high heart rate and meeting new people.  Pt expressed his was to cope with his triggers are by taking to people, taking his medicine and trying to stay calm.    Caroll RancherMarjette Anona Giovannini, LRT/CTRS         Caroll RancherLindsay, Shawnda Mauney A 05/16/2018 12:21 PM

## 2018-05-16 NOTE — Tx Team (Signed)
Interdisciplinary Treatment and Diagnostic Plan Update  05/16/2018 Time of Session: 5:41 PM  AURTHER HARLIN MRN: [id number]  Principal Diagnosis: Bipolar I disorder, current or most recent episode manic, with psychotic features ([place])  Secondary Diagnoses: Principal Problem:   Bipolar I disorder, current or most recent episode manic, with psychotic features ([place]) Active Problems:   Acute psychosis ([place])   Current Medications:  Current Facility-Administered Medications  Medication Dose Route Frequency Provider Last Rate Last Dose  . acetaminophen (TYLENOL) tablet 650 mg  650 mg Oral Q6H PRN Ethelene Hal, NP      . alum & mag hydroxide-simeth (MAALOX/MYLANTA) 200-200-20 MG/5ML suspension 30 mL  30 mL Oral Q4H PRN Ethelene Hal, NP      . benztropine (COGENTIN) tablet 2 mg  2 mg Oral BID Lavella Hammock, MD   2 mg at 05/16/18 1656  . FLUoxetine (PROZAC) capsule 20 mg  20 mg Oral Daily Lavella Hammock, MD   20 mg at 05/16/18 0743  . gabapentin (NEURONTIN) capsule 400 mg  400 mg Oral TID PRN Lavella Hammock, MD   400 mg at 05/14/18 0801  . hydrOXYzine (ATARAX/VISTARIL) tablet 25 mg  25 mg Oral Q6H PRN Pennelope Bracken, MD      . ibuprofen (ADVIL,MOTRIN) tablet 600 mg  600 mg Oral Q6H PRN Pennelope Bracken, MD   600 mg at 05/11/18 2037  . magnesium hydroxide (MILK OF MAGNESIA) suspension 30 mL  30 mL Oral Daily PRN Ethelene Hal, NP      . OLANZapine zydis (ZYPREXA) disintegrating tablet 5 mg  5 mg Oral Q8H PRN Pennelope Bracken, MD   5 mg at 05/12/18 8299   And  . ziprasidone (GEODON) injection 20 mg  20 mg Intramuscular PRN Pennelope Bracken, MD      . ondansetron (ZOFRAN) tablet 4 mg  4 mg Oral Q8H PRN Pennelope Bracken, MD      . propranolol (INDERAL) tablet 10 mg  10 mg Oral TID Lindell Spar I, NP   10 mg at 05/16/18 1655  . simethicone (MYLICON) chewable tablet 160 mg  160 mg Oral Q6H PRN Lindon Romp A, NP      . traZODone  (DESYREL) tablet 200 mg  200 mg Oral QHS PRN Laverle Hobby, PA-C   200 mg at 05/15/18 2126    PTA Medications: Medications Prior to Admission  Medication Sig Dispense Refill Last Dose  . ALPRAZolam (XANAX) 1 MG tablet Take 1 mg by mouth daily as needed.  4 04/20/2018 at Unknown time  . amitriptyline (ELAVIL) 25 MG tablet Take 1 tablet (25 mg total) by mouth at bedtime. (Patient not taking: Reported on 04/20/2018) 10 tablet 0 Not Taking at Unknown time  . amphetamine-dextroamphetamine (ADDERALL) 20 MG tablet Take 20 mg by mouth daily.  0 Past Week at Unknown time  . Buprenorphine HCl-Naloxone HCl 8-2 MG FILM Place 1 tablet under the tongue daily.    04/01/2018 at Unknown time  . ibuprofen (ADVIL,MOTRIN) 800 MG tablet Take 1 tablet (800 mg total) by mouth 3 (three) times daily. 21 tablet 0 Past Week at Unknown time  . Ibuprofen-diphenhydrAMINE Cit (ADVIL PM) 200-38 MG TABS Take 2 tablets by mouth at bedtime as needed (pain).   Past Week at Unknown time  . meclizine (ANTIVERT) 25 MG tablet Take 1 tablet (25 mg total) by mouth 3 (three) times daily as needed for dizziness. 30 tablet 0 Past Week at Unknown time  .  mometasone (NASONEX) 50 MCG/ACT nasal spray Place 2 sprays into the nose daily. (Patient not taking: Reported on 04/20/2018) 17 g 1 Not Taking at Unknown time  . ondansetron (ZOFRAN ODT) 4 MG disintegrating tablet Take 1 tablet (4 mg total) by mouth every 8 (eight) hours as needed for nausea. (Patient not taking: Reported on 04/20/2018) 10 tablet 0 Not Taking at Unknown time  . promethazine (PHENERGAN) 25 MG tablet Take 1 tablet (25 mg total) by mouth every 8 (eight) hours as needed for nausea or vomiting. 20 tablet 0 Past Week at Unknown time    Patient Stressors: Medication change or noncompliance  Patient Strengths: Communication skills  Treatment Modalities: Medication Management, Group therapy, Case management,  1 to 1 session with clinician, Psychoeducation, Recreational  therapy.   Physician Treatment Plan for Primary Diagnosis: Bipolar I disorder, current or most recent episode manic, with psychotic features ([place]) Long Term Goal(s): Improvement in symptoms so as ready for discharge  Short Term Goals: Ability to identify and develop effective coping behaviors will improve Ability to identify changes in lifestyle to reduce recurrence of condition will improve  Medication Management: Evaluate patient's response, side effects, and tolerance of medication regimen.  Therapeutic Interventions: 1 to 1 sessions, Unit Group sessions and Medication administration.  Evaluation of Outcomes: Adequate for Discharge   6/19: "There's something called the trickster that controls me and makes me say things. He repeats things over and over in my head." When asked to provide an example, pt describes the thoughts as derogatory and ego dystonic, and he provides example, "He says that me and my wife don't know each other." Discussed with patient about changing abilify to a different medication to address AH and paranoia, and he was in agreement to trial of [place]. Discussed with patient that likely recommendation if he has good tolerability and efficacy will be to transition to long-acting injectable form, and pt verbalized understanding.    Physician Treatment Plan for Secondary Diagnosis: Principal Problem:   Bipolar I disorder, current or most recent episode manic, with psychotic features ([place]) Active Problems:   Acute psychosis ([place])   Long Term Goal(s): Improvement in symptoms so as ready for discharge  Short Term Goals: Ability to identify and develop effective coping behaviors will improve Ability to identify changes in lifestyle to reduce recurrence of condition will improve  Medication Management: Evaluate patient's response, side effects, and tolerance of medication regimen.  Therapeutic Interventions: 1 to 1 sessions, Unit Group sessions and Medication  administration.  Evaluation of Outcomes: Adequate for Discharge   RN Treatment Plan for Primary Diagnosis: Bipolar I disorder, current or most recent episode manic, with psychotic features ([place]) Long Term Goal(s): Knowledge of disease and therapeutic regimen to maintain health will improve  Short Term Goals: Ability to identify and develop effective coping behaviors will improve and Compliance with prescribed medications will improve  Medication Management: RN will administer medications as ordered by provider, will assess and evaluate patient's response and provide education to patient for prescribed medication. RN will report any adverse and/or side effects to prescribing provider.  Therapeutic Interventions: 1 on 1 counseling sessions, Psychoeducation, Medication administration, Evaluate responses to treatment, Monitor vital signs and CBGs as ordered, Perform/monitor CIWA, COWS, AIMS and Fall Risk screenings as ordered, Perform wound care treatments as ordered.  Evaluation of Outcomes: Adequate for Discharge   LCSW Treatment Plan for Primary Diagnosis: Bipolar I disorder, current or most recent episode manic, with psychotic features ([place]) Long Term Goal(s): Safe transition to  appropriate next level of care at discharge, Engage patient in therapeutic group addressing interpersonal concerns.  Short Term Goals: Engage patient in aftercare planning with referrals and resources  Therapeutic Interventions: Assess for all discharge needs, 1 to 1 time with Social worker, Explore available resources and support systems, Assess for adequacy in community support network, Educate family and significant other(s) on suicide prevention, Complete Psychosocial Assessment, Interpersonal group therapy.  Evaluation of Outcomes: Met  Return home, follow up Dr [facility]   Progress in Treatment: Attending groups: No Participating in groups: No Taking medication as prescribed: Yes Toleration medication: Yes, no  side effects reported at this time Family/Significant other contact made: Yes Patient understands diagnosis: No Limited insight Discussing patient identified problems/goals with staff: Yes Medical problems stabilized or resolved: Yes Denies suicidal/homicidal ideation: Yes Issues/concerns per patient self-inventory: None Other: N/A  New problem(s) identified: None identified at this time.   New Short Term/Long Term Goal(s): "I'm just here to relax."  Discharge Plan or Barriers:   Reason for Continuation of Hospitalization:   Estimated Length of Stay: Likely d/c tomorrow  Attendees: Patient:  05/16/2018  5:41 PM  Physician: Melba Coon, MD 05/16/2018  5:41 PM  Nursing: Megan Mans, RN 05/16/2018  5:41 PM  RN Care Manager: Lars Pinks, RN 05/16/2018  5:41 PM  Social Worker: Ripley Fraise 05/16/2018  5:41 PM  Recreational Therapist: Winfield Cunas 05/16/2018  5:41 PM  Other: [name] 05/16/2018  5:41 PM  Other:  05/16/2018  5:41 PM    Scribe for Treatment Team:  Roque Lias LCSW 05/16/2018 5:41 PM

## 2018-05-16 NOTE — Plan of Care (Signed)
D: Pt denies SI/HI/AVH. Pt is pleasant and cooperative. Pt was visible in the dayroom watching TV with his peers. Pt appeared a little anxious this evening.   A: Pt was offered support and encouragement. Pt was given scheduled medications. Pt was encourage to attend groups. Q 15 minute checks were done for safety.   R:Pt attends groups and interacts well with peers and staff. Pt is taking medication. Pt has no complaints.Pt receptive to treatment and safety maintained on unit.   Problem: Education: Goal: Emotional status will improve Outcome: Progressing   Problem: Activity: Goal: Interest or engagement in activities will improve Outcome: Progressing   Problem: Safety: Goal: Periods of time without injury will increase Outcome: Progressing

## 2018-05-16 NOTE — [facility] Group Notes (Signed)
[facility] Group Notes:  (Nursing/MHT/Case Management/Adjunct)  Date:  05/16/2018  Time:  9:19 AM  Type of Therapy:  orientation and goals group  Participation Level:  Active  Participation Quality:  Appropriate  Affect:  Angry and Appropriate  Cognitive:  Alert and Appropriate  Insight:  Appropriate and Good  Engagement in Group:  Engaged and Improving  Modes of Intervention:  Confrontation and Orientation  Summary of Progress/Problems: His goal today is to work on his social anxiety.   Maurice [name] J Theodoros Stjames 05/16/2018, 9:19 AM

## 2018-05-16 NOTE — Progress Notes (Signed)
[facility] MD Progress Note  05/16/2018 1:39 PM LAMONDRE WESCHE  MRN:  [id number]  Subjective: Zeke reports, "Is it the Prozac that is making me smile all the time?  I feel good, back to my usual self, but I am stiff, can't swallow and having a hard time chewing."  History: Elih Mooney is a 32 y/o M with history of treatment for depression, ADHD, and polysubstance abuse who was admitted from [facility] on IVC initiated by his sister due to worsening symptoms of psychosis and depression including reporting that he has been telepathically communicating with [employer] and Beverly Sessions as well reporting that they have been telling him to kill himself. Pt also reportedly asked his family for a gun. Pt has recent history of concussion about 2 weeks ago and he has continued to have vertigo from that incident (Head CT was negative for acute intracranial abnormality). Pt was medically stabilized and then transferred to [facility] for additional treatment and stabilization.He was started on trial of abilify and gabapentin, and he initially was refusing those medication. He barricaded himself in his room on 6/15, and he required IM medications to manage his agitation. Second opinion was obtained for forced medications; however, ptwasinitiallytaking abilify. Pt reported ongoing symptoms of AH, and he requested change of medications, and he was started on trial of Invega with potential plan to transition to long-acting injectable form; however, he has been declining to have long-acting injectable form. Pt reported some daytime sedation which was causing him to refuse oral form of Invega, and his dose was reduced. Pthad taken lower dose of Invega once, but then he began to refuse it again. Pt was discontinued from [place], and he was started on trial of zyprexa. Forced medication of Invega sustenna and booster injection completed (05/16/2018).  Patient did experience dystonia and was provided with cogentin and PO Invega discontinued.  Patient wishes to discontinue to Buspirone as it has been not been helpful.  Today Juniper is seen, chart reviewed. The chart findings discussed with the treatment. He is visible on the unit, attending group sessions.  He is pleasant and smiling and hoping to be discharged soon.  He states that lorazepam has been helpful. He denies SI/HI/AH/VH. He denies any new issues or concerns. He has agreed to continue current plan of care already in progress.  Principal Problem: Bipolar I disorder, current or most recent episode manic, with psychotic features (H[facility]) Diagnosis:   Patient Active Problem List   Diagnosis Date Noted  . Acute psychosis (HCC) [F23]   . Bipolar I disorder, current or most recent episode manic, with psychotic features ([facility]) [F31.2] 04/21/2018  . Suicidal ideations [R45.851]   . Polysubstance (including opioids) dependence with physiological dependence (HCC) [F19.20] 04/02/2018  . 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 Delusions (HCC) [F22]    Total Time spent with patient: 40 minutes  Past Psychiatric History: See H&P  Past Medical History:  Past Medical History:  Diagnosis Date  . Anxiety   . Substance abuse ([facility])    History reviewed. No pertinent surgical history. Family History: History reviewed. No pertinent family history. Family Psychiatric  History: see H&p Social History:  Social History   Substance and Sexual Activity  Alcohol Use Not Currently  . Alcohol/week: 0.0 oz     Social History   Substance and Sexual Activity  Drug Use No    Social History   Socioeconomic History  . Marital status: Single    Spouse name: Not on file  .  Number of children: Not on file  . Years of education: Not on file  . Highest education level: Not on file  Occupational History  . Not on file  Social Needs  . Financial resource strain: Not on file  . Food insecurity:    Worry: Not on file    Inability: Not on file  . Transportation  needs:    Medical: Not on file    Non-medical: Not on file  Tobacco Use  . Smoking status: Former Games developermoker  . Smokeless tobacco: Never Used  Substance and Sexual Activity  . Alcohol use: Not Currently    Alcohol/week: 0.0 oz  . Drug use: No  . Sexual activity: Not on file  Lifestyle  . Physical activity:    Days per week: Not on file    Minutes per session: Not on file  . Stress: Not on file  Relationships  . Social connections:    Talks on phone: Not on file    Gets together: Not on file    Attends religious service: Not on file    Active member of club or organization: Not on file    Attends meetings of clubs or organizations: Not on file    Relationship status: Not on file  Other Topics Concern  . Not on file  Social History Narrative  . Not on file   Additional Social History:                         Sleep: Good  Appetite:  Good  Current Medications: Current Facility-Administered Medications  Medication Dose Route Frequency Provider Last Rate Last Dose  . acetaminophen (TYLENOL) tablet 650 mg  650 mg Oral Q6H PRN Laveda AbbeParks, Laurie Britton, NP      . alum & mag hydroxide-simeth (MAALOX/MYLANTA) 200-200-20 MG/5ML suspension 30 mL  30 mL Oral Q4H PRN Laveda AbbeParks, Laurie Britton, NP      . benztropine (COGENTIN) tablet 1 mg  1 mg Oral BID Kerry HoughSimon, Spencer E, PA-C   1 mg at 05/16/18 [id number]  . busPIRone (BUSPAR) tablet 5 mg  5 mg Oral BID Antonieta Pertlary, Greg Lawson, MD   5 mg at 05/16/18 0743  . FLUoxetine (PROZAC) capsule 20 mg  20 mg Oral Daily Mariel CraftMaurer, Sheila M, MD   20 mg at 05/16/18 0743  . gabapentin (NEURONTIN) capsule 400 mg  400 mg Oral TID PRN Mariel CraftMaurer, Sheila M, MD   400 mg at 05/14/18 0801  . hydrOXYzine (ATARAX/VISTARIL) tablet 25 mg  25 mg Oral Q6H PRN Micheal Likensainville, Christopher T, MD      . ibuprofen (ADVIL,MOTRIN) tablet 600 mg  600 mg Oral Q6H PRN Micheal Likens[place], Christopher T, MD   600 mg at 05/11/18 2037  . LORazepam (ATIVAN) tablet 0.5 mg  0.5 mg Oral Q6H PRN Micheal Likensainville,  Christopher T, MD   0.5 mg at 05/16/18 0142  . magnesium hydroxide (MILK OF MAGNESIA) suspension 30 mL  30 mL Oral Daily PRN Laveda AbbeParks, Laurie Britton, NP      . OLANZapine zydis (ZYPREXA) disintegrating tablet 5 mg  5 mg Oral Q8H PRN Micheal Likensainville, Christopher T, MD   5 mg at 05/12/18 [id number]   And  . ziprasidone (GEODON) injection 20 mg  20 mg Intramuscular PRN Micheal Likens[place], Christopher T, MD      . ondansetron (ZOFRAN) tablet 4 mg  4 mg Oral Q8H PRN Micheal Likens[place], Christopher T, MD      . paliperidone (INVEGA) 24 hr tablet 6 mg  6  mg Oral QHS Antonieta Pert, MD   Stopped at 05/15/18 2042  . propranolol (INDERAL) tablet 10 mg  10 mg Oral TID Armandina Stammer I, NP   10 mg at 05/16/18 1323  . simethicone (MYLICON) chewable tablet 160 mg  160 mg Oral Q6H PRN Nira Conn A, NP      . traZODone (DESYREL) tablet 200 mg  200 mg Oral QHS PRN Kerry Hough, PA-C   200 mg at 05/15/18 2126   Lab Results: No results found for this or any previous visit (from the past 48 hour(s)).  Blood Alcohol level:  Lab Results  Component Value Date   ETH <10 04/20/2018    Metabolic Disorder Labs: Lab Results  Component Value Date   HGBA1C 5.6 05/08/2018   MPG 114.02 05/08/2018   Lab Results  Component Value Date   PROLACTIN 43.7 (H) 05/07/2018   Lab Results  Component Value Date   CHOL 141 05/08/2018   TRIG 210 (H) 05/08/2018   HDL 40 (L) 05/08/2018   CHOLHDL 3.5 05/08/2018   VLDL 42 (H) 05/08/2018   LDLCALC 59 05/08/2018   Physical Findings: AIMS: Facial and Oral Movements Muscles of Facial Expression: None, normal Lips and Perioral Area: None, normal Jaw: None, normal Tongue: None, normal,Extremity Movements Upper (arms, wrists, hands, fingers): None, normal Lower (legs, knees, ankles, toes): None, normal, Trunk Movements Neck, shoulders, hips: None, normal, Overall Severity Severity of abnormal movements (highest score from questions above): None, normal Incapacitation due to abnormal movements: None,  normal Patient's awareness of abnormal movements (rate only patient's report): No Awareness, Dental Status Current problems with teeth and/or dentures?: No Does patient usually wear dentures?: No  CIWA:  CIWA-Ar Total: 3 COWS:  COWS Total Score: 8  Musculoskeletal: Strength & Muscle Tone: within normal limits Gait & Station: normal Patient leans: N/A  Psychiatric Specialty Exam: Physical Exam  Nursing note and vitals reviewed. Constitutional: He is oriented to person, place, and time. He appears well-developed and well-nourished.  HENT:  Head: Atraumatic.  Musculoskeletal:  Decreased ROM, cogwheeling UE right > left.   Neurological: He is alert and oriented to person, place, and time.  Psychiatric: He has a normal mood and affect. His behavior is normal.    Review of Systems  Unable to perform ROS: Psychiatric disorder  Psychiatric/Behavioral: Positive for hallucinations (Hx. psychosis) and substance abuse (Hx. Benzodiazpeine). Negative for depression, memory loss and suicidal ideas. The patient is not nervous/anxious and does not have insomnia.     Blood pressure 126/89, pulse (!) 110, temperature 98.9 F (37.2 C), temperature source Oral, resp. rate 18, height 5\' 8"  (1.727 m), weight 68 kg (150 lb).Body mass index is 22.81 kg/m.  General Appearance: Casual and Fairly Groomed  Eye Contact:  Good  Speech:  Clear and Coherent and Normal Rate  Volume:  Normal  Mood:  "Improving"  Affect:  Restricted  Thought Process:  Coherent, Goal Directed and Descriptions of Associations: Intact  Orientation:  Full (Time, Place, and Person)  Thought Content:  Logical  Suicidal Thoughts:  Denies  Homicidal Thoughts:  Denies  Memory:  Immediate;   Good Recent;   Good Remote;   Good  Judgement:  Other:  Improved  Insight:  Fair and Lacking  Psychomotor Activity:  Normal  Concentration:  Concentration: Fair  Recall:  [employer] of Knowledge:  Fair  Language:  Fair  Akathisia:   Negative  Handed:    AIMS (if indicated):     Assets:  Communication Skills Desire for Improvement Resilience Social Support  ADL's:  Intact  Cognition:  WNL  Sleep:  Number of Hours: 5.5   Treatment Plan Summary: Daily contact with patient to assess and evaluate symptoms and progress in treatment and Medication management   -Continue inpatient hospitalization.  -Will continue today 05/16/2018 plan as below except where it is noted.  -Bipolar I, current episode manic with psychotic features - Discontinue paliperidone (Invega) 6 mg po Q hs due to dystonia             - Invega Sustenna 156 mg/ML Q 28 days (completed on 05-16-18).  -Anxiety  -Continue vistaril 25 mg po q6h prn anxiety -Continue Gabapentin 400mg  po TID.            -Continue Buspar 5 mg po bid.            - Initiated Propranolol 10 mg po tid for elevated pulse rate.  Depression.  - Continue Prozac 20 mg daily today, then  Dystonia     - Increase Congentin to 2 mg BID.             -Agitation -Continue zydis 5 mg po q8h prn agitation/severe anxiety -Continue geodon 20mg  IM q12h prn severe agitation and unable to take oral medication  -Insomnia  -Continuetrazodone 200 mg po qhs PRN.  -Encourage participation in groups and therapeutic milieu  -Disposition planning will be ongoing    Mariel Craft, MD   05/16/2018, 1:39 PMPatient ID: [name], male   DOB: [birth year], 32 y.o.   MRN: [id number]

## 2018-05-16 NOTE — Plan of Care (Signed)
  Problem: Safety: Goal: Periods of time without injury will increase Outcome: Progressing   Problem: Health Behavior/Discharge Planning: Goal: Compliance with prescribed medication regimen will improve Outcome: Progressing   Problem: Nutritional: Goal: Ability to achieve adequate nutritional intake will improve Outcome: Progressing DAR NOTE: Patient presents with anxious affect and depressed mood.  Denies suicidal thoughts, pain, auditory and visual hallucinations.  Rates depression at 0, hopelessness at 0, and anxiety at 4.  Maintained on routine safety checks.  Medications given as prescribed.  Support and encouragement offered as needed.  Attended group and participated.  States goal for today is "my social anxiety."  Patient visible in milieu with without interactioning.  Hinda GlatterInvega Sustenna IM given.  No adverse reaction noted.  Offered no complaint.

## 2018-05-17 MED ORDER — GABAPENTIN 400 MG PO CAPS: 400.0000 mg | ORAL_CAPSULE | Freq: Three times a day (TID) | ORAL | 0 refills | Status: DC | PRN

## 2018-05-17 MED ORDER — PROPRANOLOL HCL 10 MG PO TABS: 10.0000 mg | ORAL_TABLET | Freq: Three times a day (TID) | ORAL | 0 refills | Status: DC

## 2018-05-17 MED ORDER — TRAZODONE HCL 100 MG PO TABS: 200.0000 mg | ORAL_TABLET | Freq: Every evening | ORAL | 0 refills | Status: DC | PRN

## 2018-05-17 MED ORDER — FLUOXETINE HCL 20 MG PO CAPS: 20.0000 mg | ORAL_CAPSULE | Freq: Every day | ORAL | 0 refills | Status: DC

## 2018-05-17 MED ORDER — HYDROXYZINE HCL 25 MG PO TABS: 25.0000 mg | ORAL_TABLET | Freq: Four times a day (QID) | ORAL | 0 refills | Status: DC | PRN

## 2018-05-17 MED ORDER — BENZTROPINE MESYLATE 1 MG PO TABS: 1.0000 mg | ORAL_TABLET | Freq: Two times a day (BID) | ORAL | 0 refills | Status: DC

## 2018-05-17 NOTE — Discharge Summary (Addendum)
Physician Discharge Summary Note  Patient:  Maurice [name] is an 32 y.o., male MRN:  [id number] DOB:  Jun 08, 1986 Patient phone:  [phone] (home)  Patient address:   [address] [place] [address],  Total Time spent with patient: Greater than 30 minutes  Date of Admission:  04/21/2018  Date of Discharge: 05-17-18  Reason for Admission: Worsening symptoms of psychosis and depression   Principal Problem: Bipolar I disorder, current or most recent episode manic, with psychotic features [facility])  Discharge Diagnoses: Patient Active Problem List   Diagnosis Date Noted  . Acute psychosis ([place]) [F23]   . Bipolar I disorder, current or most recent episode manic, with psychotic features ([place]) [F31.2] 04/21/2018  . Suicidal ideations [R45.851]   . Polysubstance (including opioids) dependence with physiological dependence ([place]) [F19.20] 04/02/2018  . Substance-induced anxiety disorder ([place]) [F19.980] 04/02/2018  . Delusions ([place]) [F22]    Past Psychiatric History: Substance use disorder, Bipolar 1 disorder  Past Medical History:  Past Medical History:  Diagnosis Date  . Anxiety   . Substance abuse ([place])    History reviewed. No pertinent surgical history.  Family History: History reviewed. No pertinent family history.  Family Psychiatric  History: See H^&P  Social History:  Social History   Substance and Sexual Activity  Alcohol Use Not Currently  . Alcohol/week: 0.0 oz     Social History   Substance and Sexual Activity  Drug Use No    Social History   Socioeconomic History  . Marital status: Single    Spouse name: Not on file  . Number of children: Not on file  . Years of education: Not on file  . Highest education level: Not on file  Occupational History  . Not on file  Social Needs  . Financial resource strain: Not on file  . Food insecurity:    Worry: Not on file    Inability: Not on file  . Transportation needs:    Medical: Not on file     Non-medical: Not on file  Tobacco Use  . Smoking status: Former Research scientist (life sciences)  . Smokeless tobacco: Never Used  Substance and Sexual Activity  . Alcohol use: Not Currently    Alcohol/week: 0.0 oz  . Drug use: No  . Sexual activity: Not on file  Lifestyle  . Physical activity:    Days per week: Not on file    Minutes per session: Not on file  . Stress: Not on file  Relationships  . Social connections:    Talks on phone: Not on file    Gets together: Not on file    Attends religious service: Not on file    Active member of club or organization: Not on file    Attends meetings of clubs or organizations: Not on file    Relationship status: Not on file  Other Topics Concern  . Not on file  Social History Narrative  . Not on file   Hospital Course: (Per Md's H&P): Maurice [name] is a 32 y/o M with history of treatment for depression, ADHD, and polysubstance abuse who was admitted from [place] on IVC initiated by his sister due to worsening symptoms of psychosis and depression including reporting that he has been telepathically communicating with Tesla and Iona Beard as well reporting that they have been telling him to kill himself. Pt also reportedly asked his family for a gun. Pt has recent history of concussion about 2 weeks ago and he has continued to have vertigo from  that incident (Head CT was negative for acute intracranial abnormality). Pt was medically stabilized and then transferred to [facility] for additional treatment and stabilization. Upon initial interview, pt is irritable, minimizing, and only partially cooperative with intake interview. When asked what happened that lead up to coming to the hospital, pt shares, "I don't even remember. I haven't even talked to anyone in my family." Pt was asked directly about the concerns described in the IVC, and he denies ever reporting that he was telepathically communicating with anyone. He denies ever asking his family for a gun. He denies depressed mood.  He endorses decreased energy, poor concentration, and poor appetite, but he otherwise denies all symptoms of depression.  After the above admission assessment, Maurice [name] was started on the medication regimen for his presenting symptoms. He was medicated & discharged on; Gabapentin 400 mg for agitation, Hydroxyzine 25 mg prn for anxiety, Propranolol 10 mg for anxiety, Fluoxetine 20 mg for depression, Cogentin 1 mg for EPS & Trazodone 100 mg prn for insomnia. Maurice [name] was also enrolled & participated in the group counseling sessions being offered & held on this unit. He learned coping skills that should help him after discharge to cope better.  As his treatment progressed, daily assessment notes marked improvement in his symptoms. He feels good. He is optimistic about the future mental state. No residual psychotic features. No craving for substances. No anxiety. The features of depression has markedly improved since coming coming to the hospital & starting treatments. No thoughts of violence. No access to weapons. No new stressors.    Maurice [name] case was presented during treatment team meeting this morning. The nursing staff reports that patient has been appropriate on the unit. Patient has been interacting some  with peers. No behavioral issues. Patient has not voiced any suicidal thoughts. Patient has not been observed to be internally stimulated or preoccupied. Patient has been adherent with treatment recommendations. Patient has been tolerating their medication well.   The treatment team members feel that patient is back to his baseline level of function. The team agreed with plan to discharge patient today to continue mental health treatment on an outpatient basis as noted below. He is provided with all the necessary information needed to make this appointment without any problems. He left [facility] with all personal belongings in no apparent distress.   Physical Findings: AIMS: Facial and Oral  Movements Muscles of Facial Expression: None, normal Lips and Perioral Area: None, normal Jaw: None, normal Tongue: None, normal,Extremity Movements Upper (arms, wrists, hands, fingers): None, normal Lower (legs, knees, ankles, toes): None, normal, Trunk Movements Neck, shoulders, hips: None, normal, Overall Severity Severity of abnormal movements (highest score from questions above): None, normal Incapacitation due to abnormal movements: None, normal Patient's awareness of abnormal movements (rate only patient's report): No Awareness, Dental Status Current problems with teeth and/or dentures?: No Does patient usually wear dentures?: No  CIWA:  CIWA-Ar Total: 3 COWS:  COWS Total Score: 8  Musculoskeletal: Strength & Muscle Tone: within normal limits Gait & Station: normal Patient leans: N/A  Psychiatric Specialty Exam: Physical Exam  Constitutional: He appears well-developed.  HENT:  Head: Normocephalic.  Eyes: Pupils are equal, round, and reactive to light.  Neck: Normal range of motion.  Cardiovascular: Normal rate.  Respiratory: Effort normal.  GI: Soft.  Genitourinary:  Genitourinary Comments: Deferred  Musculoskeletal: Normal range of motion.  Neurological: He is alert.  Skin: Skin is warm.    Review of Systems  Constitutional: Negative.  HENT: Negative.   Eyes: Negative.   Respiratory: Negative.   Cardiovascular: Negative.   Gastrointestinal: Negative.   Genitourinary: Negative.   Musculoskeletal: Negative.   Skin: Negative.   Neurological: Negative.   Endo/Heme/Allergies: Negative.   Psychiatric/Behavioral: Positive for depression (Stabilized with medication prior to discharge), hallucinations (Hx. Psychosis (Stabilized with medication prior to discharge) and substance abuse (Hx. Benzodiazepine/Amphetamine use.). Negative for memory loss and suicidal ideas. The patient has insomnia (Stabibilized with medication prior to discharge). The patient is not  nervous/anxious.     Blood pressure 125/68, pulse (!) 121, temperature 98.4 F (36.9 C), temperature source Oral, resp. rate 16, height 5' 8" (1.727 m), weight 68 kg (150 lb).Body mass index is 22.81 kg/m.  See Md's SRA   Have you used any form of tobacco in the last 30 days? (Cigarettes, Smokeless Tobacco, Cigars, and/or Pipes): No  Has this patient used any form of tobacco in the last 30 days? (Cigarettes, Smokeless Tobacco, Cigars, and/or Pipes): N/A  Blood Alcohol level:  Lab Results  Component Value Date   ETH <10 97/67/3419   Metabolic Disorder Labs:  Lab Results  Component Value Date   HGBA1C 5.6 05/08/2018   MPG 114.02 05/08/2018   Lab Results  Component Value Date   PROLACTIN 43.7 (H) 05/07/2018   Lab Results  Component Value Date   CHOL 141 05/08/2018   TRIG 210 (H) 05/08/2018   HDL 40 (L) 05/08/2018   CHOLHDL 3.5 05/08/2018   VLDL 42 (H) 05/08/2018   LDLCALC 59 05/08/2018   See Psychiatric Specialty Exam and Suicide Risk Assessment completed by Attending Physician prior to discharge.  Discharge destination:  Home  Is patient on multiple antipsychotic therapies at discharge:  No   Has Patient had three or more failed trials of antipsychotic monotherapy by history:  No  Recommended Plan for Multiple Antipsychotic Therapies: NA  Allergies as of 05/17/2018      Reactions   Other Nausea And Vomiting   Red meat products      Medication List    STOP taking these medications   ADVIL PM 200-38 MG Tabs Generic drug:  Ibuprofen-diphenhydrAMINE Cit   ALPRAZolam 1 MG tablet Commonly known as:  XANAX   amitriptyline 25 MG tablet Commonly known as:  ELAVIL   amphetamine-dextroamphetamine 20 MG tablet Commonly known as:  ADDERALL   Buprenorphine HCl-Naloxone HCl 8-2 MG Film   ibuprofen 800 MG tablet Commonly known as:  ADVIL,MOTRIN   meclizine 25 MG tablet Commonly known as:  ANTIVERT   mometasone 50 MCG/ACT nasal spray Commonly known as:  NASONEX    ondansetron 4 MG disintegrating tablet Commonly known as:  ZOFRAN ODT   promethazine 25 MG tablet Commonly known as:  PHENERGAN     TAKE these medications     Indication  benztropine 1 MG tablet Commonly known as:  COGENTIN Take 1 tablet (1 mg total) by mouth 2 (two) times daily. For prevention of drug induced tremors  Indication:  Extrapyramidal Reaction caused by Medications   FLUoxetine 20 MG capsule Commonly known as:  PROZAC Take 1 capsule (20 mg total) by mouth daily. For depression Start taking on:  05/18/2018  Indication:  Major Depressive Disorder   gabapentin 400 MG capsule Commonly known as:  NEURONTIN Take 1 capsule (400 mg total) by mouth 3 (three) times daily as needed (anxiety). (agitation)  Indication:  Agitation   hydrOXYzine 25 MG tablet Commonly known as:  ATARAX/VISTARIL Take 1 tablet (25 mg total) by mouth  every 6 (six) hours as needed (mild/moderate anxiety, insomnia).  Indication:  Feeling Anxious, Insomnia   propranolol 10 MG tablet Commonly known as:  INDERAL Take 1 tablet (10 mg total) by mouth 3 (three) times daily. For anxiety  Indication:  Anxiety   traZODone 100 MG tablet Commonly known as:  DESYREL Take 2 tablets (200 mg total) by mouth at bedtime as needed for sleep.  Indication:  Trouble Sleeping      Follow-up Information    Chucky May, MD Follow up on 05/20/2018.   Specialty:  Psychiatry Why:  Friday at [facility] for your hospital follow up appointment Contact information: [place] [place] [address] [phone]          Follow-up recommendations: Activity:  As tolerated Diet: As recommended by your primary care doctor. Keep all scheduled follow-up appointments as recommended.   Comments: Patient is instructed prior to discharge to: Take all medications as prescribed by his/her mental healthcare provider. Report any adverse effects and or reactions from the medicines to his/her  outpatient provider promptly. Patient has been instructed & cautioned: To not engage in alcohol and or illegal drug use while on prescription medicines. In the event of worsening symptoms, patient is instructed to call the crisis hotline, 911 and or go to the nearest ED for appropriate evaluation and treatment of symptoms. To follow-up with his/her primary care provider for your other medical issues, concerns and or health care needs.   Signed: Lindell Spar, NP, PMHNP, FNP-BC. 05/17/2018, 9:39 AM   I have reviewed NP's Note, assessement, diagnosis and plan, and agree. I have also met with patient and completed suicide risk assessment.  Lavella Hammock, MD

## 2018-05-17 NOTE — Progress Notes (Signed)
Recreation Therapy Notes  INPATIENT RECREATION TR PLAN  Patient Details Name: Maurice [name] MRN: [id number] DOB: 1986-10-19 Today's Date: 05/17/2018  Rec Therapy Plan Is patient appropriate for Therapeutic Recreation?: Yes Treatment times per week: about 3 days Estimated Length of Stay: 5-7 days TR Treatment/Interventions: Group participation (Comment)  Discharge Criteria Pt will be discharged from therapy if:: Discharged Treatment plan/goals/alternatives discussed and agreed upon by:: Patient/family  Discharge Summary Short term goals set: See patient care plan Short term goals met: Adequate for discharge Progress toward goals comments: Groups attended Which groups?: AAA/T, Other (Comment)(Triggers) Reason goals not met: None Therapeutic equipment acquired: N/A Reason patient discharged from therapy: Discharge from hospital Pt/family agrees with progress & goals achieved: Yes Date patient discharged from therapy: 05/17/18    Victorino Sparrow, LRT/CTRS  Ria Comment, Lytle Malburg A 05/17/2018, 11:22 AM

## 2018-05-17 NOTE — Progress Notes (Signed)
Pt appears paranoid about his roommate and seems to be having a hard time sleeping 

## 2018-05-17 NOTE — [facility] Suicide Risk Assessment (Signed)
[facility] Discharge Suicide Risk Assessment   Principal Problem: Bipolar I disorder, current or most recent episode manic, with psychotic features [facility]) Discharge Diagnoses:  Patient Active Problem List   Diagnosis Date Noted  . Acute psychosis ([facility]) [F23]   . Bipolar I disorder, current or most recent episode manic, with psychotic features ([facility]) [F31.2] 04/21/2018  . Suicidal ideations [R45.851]   . Polysubstance (including opioids) dependence with physiological dependence ([facility]) [F19.20] 04/02/2018  . 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 Delusions ([facility]) [F22]     Total Time spent with patient: 45 minutes   History of Present Illness: Maurice [name] is a 32 y/o M with history of treatment for depression, ADHD, and polysubstance abuse who was admitted from [facility] on IVC initiated by his sister due to worsening symptoms of psychosis and depression including reporting that he has been telepathically communicating with Tesla and Beverly Sessions as well reporting that they have been telling him to kill himself. Pt also reportedly asked his family for a gun. Pt has recent history of concussion about 2 weeks ago and he has continued to have vertigo from that incident (Head CT was negative for acute intracranial abnormality). Pt was medically stabilized and then transferred to [facility] for additional treatment and stabilization. Upon initial interview, pt is irritable, minimizing, and only partially cooperative with intake interview. When asked what happened that lead up to coming to the hospital, pt shares, "I don't even remember. I haven't even talked to anyone in my family." Pt was asked directly about the concerns described in the IVC, and he denies ever reporting that he was telepathically communicating with anyone. He denies ever asking his family for a gun. He denies depressed mood. He endorses decreased energy, poor concentration, and poor appetite, but he otherwise denies all symptoms  of depression. He denies SI/HI/AH/VH.  He denies all symptoms of mania, OCD, and PTSD. He denies all recent illicit substance use, and he shares that he had been using alcohol, tobacco, and cannabis up until about 2 months ago. Discussed with patient about treatment options. He follows up with Dr. Evelene Croon as an outpatient. He reports his only home medication is xanax 1-2mg  qDay which he does not take every day. He recently was also on adderall and suboxone, but he reports he stopped taking those about 2 months ago. Discussed with patient about starting on a medication to address his presenting symptoms of psychosis and mood symptoms, and he agreed to trial of abilify. We will also attempt trial of gabapentin for anxiety. Pt was in agreement with the above plan, and he had no further questions, comments, or concerns.  Today Maurice [name] is seen, chart reviewed. The chart findings discussed with the treatment. He is visible on the unit, attending group sessions.  He is pleasant and smiling and happy to be discharging today.  He reports that he intends to continue taking medications as prescribed on the unit.  He did have dystonia and drooling from [place] which is improving.  He states that his calf muscles are "tight from playing basketball during rec therapy".  He denies other side effects from medication, and has tolerated being off Gabapentin and Buspirone which made him tired during the day, and he did not find helpful. He denies SI/HI/AH/VH. He denies any new issues or concerns. He has agreed to continue current plan of care already in progress. He was able to engage in safety planning including plan to return to [facility] or contact emergency services if  he feels unable to maintain his own safety or the safety of others. Pt had no further questions, comments, or concerns.   Musculoskeletal: Strength & Muscle Tone: within normal limits Gait & Station: normal Patient leans: N/A  Psychiatric Specialty Exam: ROS   Blood pressure 125/68, pulse (!) 121, temperature 98.4 F (36.9 C), temperature source Oral, resp. rate 16, height 5\' 8"  (1.727 m), weight 68 kg (150 lb).Body mass index is 22.81 kg/m.  General Appearance: Fairly Groomed  Patent attorney::  Good  Speech:  Clear and Coherent and Normal Rate  Volume:  Normal  Mood:  Anxious and Euthymic  Affect:  Congruent and pleasant  Thought Process:  Coherent, Goal Directed, Linear and Descriptions of Associations: Intact  Orientation:  Full (Time, Place, and Person)  Thought Content:  Logical and Hallucinations: None  Suicidal Thoughts:  No  Homicidal Thoughts:  No  Memory:  Immediate;   Good Recent;   Good Remote;   Good  Judgement:  Good  Insight:  Good  Psychomotor Activity:  Shuffling Gait and dystonia, drooling improving  Concentration:  Good  Recall:  Good  Fund of Knowledge:Good  Language: Good  Akathisia:  No  Handed:  Right  AIMS (if indicated):     Assets:  Communication Skills Desire for Improvement Social Support  Sleep:  Number of Hours: 4.25  Cognition: WNL  ADL's:  Intact   Mental Status Per Nursing Assessment::   On Admission:  NA  Demographic Factors:  Male, Low socioeconomic status and Unemployed  Loss Factors: Decrease in vocational status  Historical Factors: hx of head trauma  Risk Reduction Factors:   Living with another person, especially a relative and Positive social support  Continued Clinical Symptoms:  Severe Anxiety and/or Agitation Depression:   Comorbid alcohol abuse/dependence Alcohol/Substance Abuse/Dependencies  Cognitive Features That Contribute To Risk:  None    Suicide Risk:  Minimal: No identifiable suicidal ideation.  Maurice [name] presenting with no risk factors but with morbid ruminations; may be classified as minimal risk based on the severity of the depressive symptoms  Follow-up Information    Milagros Evener, MD Follow up on 05/20/2018.   Specialty:  Psychiatry Why:  Friday at [facility]  for your hospital follow up appointment Contact information: [address] [place] [address] [phone]           Plan Of Care/Follow-up recommendations:  Activity:  as tolerated Diet:  as tolerated   Treatment Plan Summary:  -Will continue medications as prescribed in hospital:    -Bipolar I, current episode manic with psychotic features            - Invega Sustenna 156 mg/ML Q 28 days (completed on 05-16-18).   - NEXT DOSE DUE 06/13/2018  -Anxiety  -Continue vistaril 25 mg po q6h prn anxiety            - Continue Propranolol 10 mg po tid for anxiety/elevated pulse rate.  Depression.             - Continue Prozac 20 mg daily today  Dystonia                                   - Continue Cogentin to 2 mg BID, can change to PRN once dystonia resolves.  -Insomnia  -Continuetrazodone 200 mg po qhs PRN.   -Disposition:  05/17/2018 into care of family.  -He was  able to engage in safety planning [facility] or contact emergency services if he feels unable to maintain his own safety or the safety of others. Pt had no further questions, comments, or concerns.     Maurice CraftSHEILA M Zamara Cozad, MD 05/17/2018, 8:58 AM

## 2018-05-17 NOTE — Progress Notes (Signed)
Recreation Therapy Notes  Date: 7.9.19 Time: 1000 Location: 500 Hall Dayroom  Group Topic: Leisure Education  Goal Area(s) Addresses:  Patient will identify positive leisure activities.  Patient will identify one positive benefit of participation in leisure activities.   Intervention: 2 Small [employer] Cla[employer]orne[employer]ach Balls  Activity: Keep It ContractorGoing Volleyball.  Patients were seated in a circle.  LRT placed one ball in rotation.  Patients were instructed they had to keep the ball moving at all times.  LRT would keep count of the number of hits on the ball. If the ball stopped moving at any time, the count would start over.  Education:  Leisure Education, Building control surveyorDischarge Planning  Education Outcome: Acknowledges education/In group clarification offered/Needs additional education  Clinical Observations/Feedback: Pt did not attend group.    Caroll RancherMarjette Serge Main, LRT/CTRS         Caroll RancherLindsay, Abhinav Mayorquin A 05/17/2018 12:33 PM

## 2018-05-17 NOTE — Plan of Care (Signed)
Pt attended some recreation therapy sessions with minimal prompting.   [name], LRT/CTRS

## 2018-05-17 NOTE — [facility] Group Notes (Signed)
[facility] Group Notes:  (Nursing/MHT/Case Management/Adjunct)  Date:  05/17/2018  Time:  11:38 AM  Type of Therapy:  orientation and goals group  Participation Level:  Active  Participation Quality:  Appropriate  Affect:  Appropriate  Cognitive:  Appropriate  Insight:  Appropriate and Good  Engagement in Group:  Engaged and Improving  Modes of Intervention:  Education, Orientation and Socialization  Summary of Progress/Problems: His goal for today is to go home and to help cope with his social anxiety.   Maurice [name] Maurice [name] Jailine Lieder 05/17/2018, 11:38 AM

## 2018-05-17 NOTE — Progress Notes (Signed)
Pt discharged to lobby. Pt was stable and appreciative at that time. All papers and prescriptions were given and valuables returned. Verbal understanding expressed. Denies SI/HI and A/VH. Pt given opportunity to express concerns and ask questions.  

## 2018-05-17 NOTE — Progress Notes (Signed)
  [facility] Discharge [facility] :  Will you be returning to the same living situation after discharge:  Yes,  home At discharge, do you have transportation home?: Yes,  family Do you have the ability to pay for your medications: Yes,  insurance  Release of information consent forms completed and in the chart;  Patient's signature needed at discharge.  Patient to Follow up at: Follow-up Information    Milagros EvenerKaur, Rupinder, MD Follow up on 05/20/2018.   Specialty:  Psychiatry Why:  Friday at [facility]6PM for your hospital follow up appointment Contact information: [address] [address] [place] [address] [phone]           Next level of care provider [facility] Link:no  Safety Planning and Suicide Prevention discussed: Yes,  yes  Have you used any form of tobacco in the last 30 days? (Cigarettes, Smokeless Tobacco, Cigars, and/or Pipes): No  Has patient been referred to the Quitline?: N/A patient is not a smoker  Patient has been referred for addiction treatment: N/A  Ida RogueRodney B Anastasya Jewell, LCSW 05/17/2018, 10:32 AM

## 2018-05-20 DIAGNOSIS — F410 Panic disorder [episodic paroxysmal anxiety] without agoraphobia: Secondary | ICD-10-CM | POA: Diagnosis not present

## 2018-05-20 DIAGNOSIS — F900 Attention-deficit hyperactivity disorder, predominantly inattentive type: Secondary | ICD-10-CM | POA: Diagnosis not present

## 2018-05-20 DIAGNOSIS — F4010 Social phobia, unspecified: Secondary | ICD-10-CM | POA: Diagnosis not present

## 2018-06-04 ENCOUNTER — Inpatient Hospital Stay: Admission: RE | Admit: 2018-06-04 | Payer: Self-pay | Source: Ambulatory Visit

## 2018-06-04 DIAGNOSIS — F23 Brief psychotic disorder: Secondary | ICD-10-CM | POA: Diagnosis not present

## 2018-06-11 ENCOUNTER — Ambulatory Visit
Admission: RE | Admit: 2018-06-11 | Discharge: 2018-06-11 | Disposition: A | Discharge status: Home / Self Care | Payer: BLUE CROSS/BLUE SHIELD | Source: Ambulatory Visit | Attending: Family Medicine | Admitting: Family Medicine

## 2018-06-11 DIAGNOSIS — G44319 Acute post-traumatic headache, not intractable: Secondary | ICD-10-CM

## 2018-06-11 DIAGNOSIS — R4182 Altered mental status, unspecified: Secondary | ICD-10-CM | POA: Diagnosis not present

## 2018-06-11 DIAGNOSIS — R29818 Other symptoms and signs involving the nervous system: Secondary | ICD-10-CM

## 2018-06-11 MED ORDER — GADOBENATE DIMEGLUMINE 529 MG/ML IV SOLN
14.0000 mL | Freq: Once | INTRAVENOUS | Status: AC | PRN
  Administered 2018-06-11: 14 mL via INTRAVENOUS

## 2018-06-22 ENCOUNTER — Telehealth: Payer: Self-pay | Admitting: Family Medicine

## 2018-06-22 NOTE — Telephone Encounter (Signed)
Copied from CRM [phone]. Topic: General - Other >> Jun 22, 2018  1:39 PM Gean BirchwoodWilliams-Neal, Sade R wrote: Pts  mom is calling in to see if CT results are back from 06/11/2018. Advised mom she wasn't on pts DPR she states she understands but she wants to know if there is away that she can get results due to his symptoms. Mom also wants to know if a follow up needs to be scheduled wit Dr Clelia CroftShaw since [facility]

## 2018-06-25 DIAGNOSIS — F4010 Social phobia, unspecified: Secondary | ICD-10-CM | POA: Diagnosis not present

## 2018-06-25 DIAGNOSIS — F23 Brief psychotic disorder: Secondary | ICD-10-CM | POA: Diagnosis not present

## 2018-06-25 DIAGNOSIS — F900 Attention-deficit hyperactivity disorder, predominantly inattentive type: Secondary | ICD-10-CM | POA: Diagnosis not present

## 2018-07-01 NOTE — Telephone Encounter (Signed)
Pt father calling back to follow up. Please advise

## 2018-07-15 DIAGNOSIS — F23 Brief psychotic disorder: Secondary | ICD-10-CM | POA: Diagnosis not present

## 2018-07-15 DIAGNOSIS — F900 Attention-deficit hyperactivity disorder, predominantly inattentive type: Secondary | ICD-10-CM | POA: Diagnosis not present

## 2018-09-08 DIAGNOSIS — F900 Attention-deficit hyperactivity disorder, predominantly inattentive type: Secondary | ICD-10-CM | POA: Diagnosis not present

## 2018-09-08 DIAGNOSIS — F4010 Social phobia, unspecified: Secondary | ICD-10-CM | POA: Diagnosis not present

## 2018-10-17 IMAGING — MR MR HEAD WO/W CM
10 series · 48 of 48 positions shown · IV contrast (multihance)
Comparison: Head CT 04/01/2018.

CLINICAL DATA: Head injury in [REDACTED] Mental status
changes with difficulty concentrating.

EXAM:
MRI HEAD WITHOUT AND WITH CONTRAST
TECHNIQUE: Multiplanar, multiecho pulse sequences of the brain and surrounding
structures were obtained without and with intravenous contrast.
CONTRAST:  14mL MULTIHANCE GADOBENATE DIMEGLUMINE 529 MG/ML IV SOLN

[Series 5: T1 · sagittal · 4.0mm · 0.75mm/px · 3 of 29 slices shown (1 of 3)]
[im 1/29]
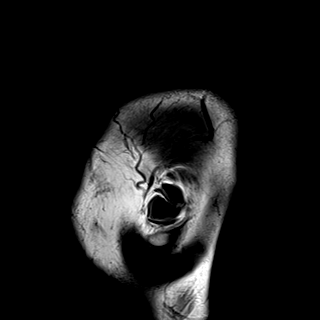
[im 15/29]
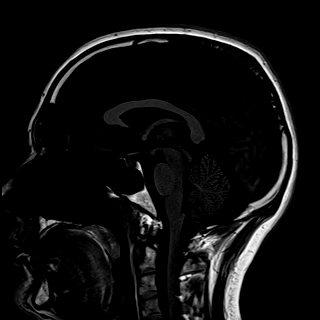
[im 29/29]
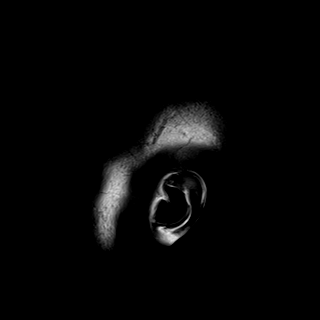

[Series 6: DWI · axial · 3.0mm · 1.44mm/px · z∈[-43,+100]mm · 7 of 90 slices shown (1 of 2)]
[im 1/90]
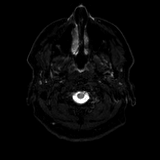
[im 15/90]
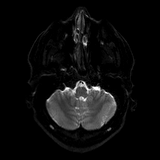
[im 30/90]
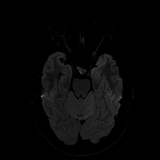
[im 45/90]
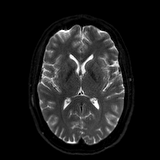
[im 60/90]
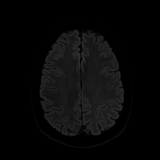
[im 75/90]
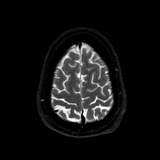
[im 90/90]
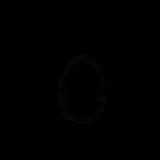

[Series 7: DWI · axial · 3.0mm · 1.44mm/px · z∈[-43,+100]mm · 3 of 45 slices shown (2 of 2)]
[im 1/45]
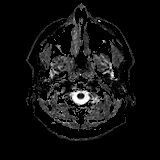
[im 23/45]
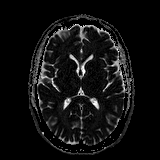
[im 45/45]
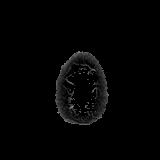

[Series 8: T2 · axial · 4.0mm · 0.36mm/px · z∈[-41,+97]mm · 2 of 28 slices shown]
[im 1/28]
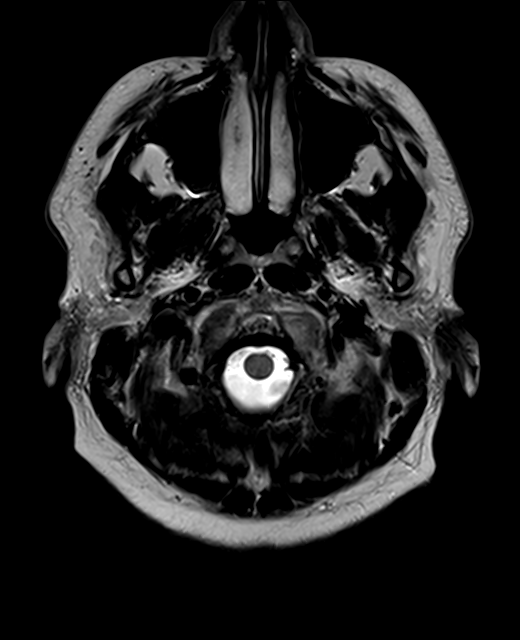
[im 28/28]
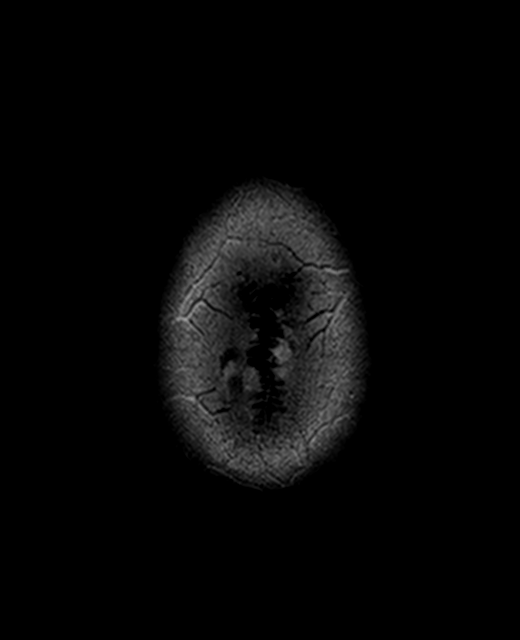

[Series 9: FLAIR · axial · 3.0mm · 0.72mm/px · z∈[-49,+100]mm · 2 of 26 slices shown]
[im 1/26]
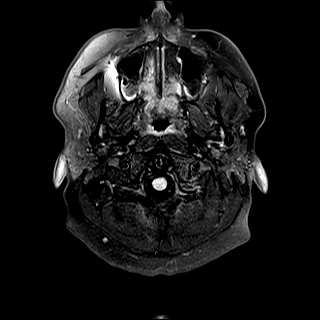
[im 26/26]
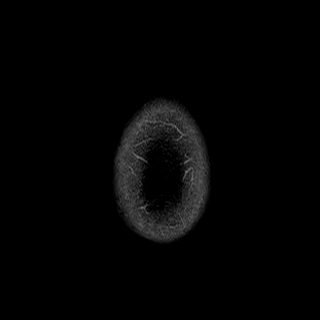

[Series 11: swi_images · axial · 1.5mm · 0.90mm/px · z∈[-42,+99]mm · 7 of 96 slices shown]
[im 1/96]
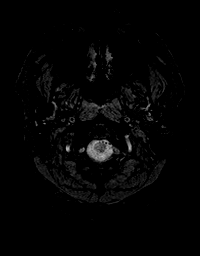
[im 16/96]
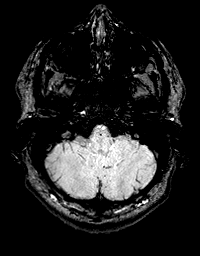
[im 32/96]
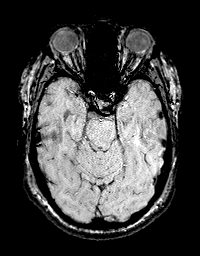
[im 48/96]
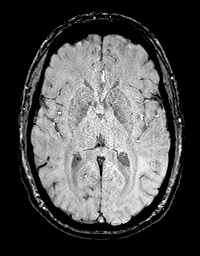
[im 64/96]
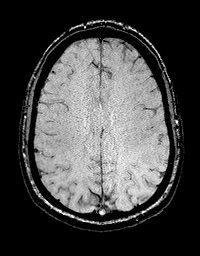
[im 80/96]
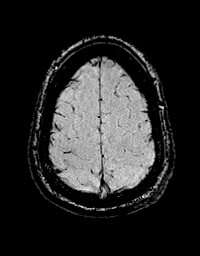
[im 96/96]
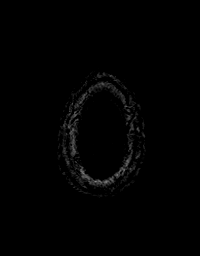

[Series 12: T1 · axial · 1.0mm · 0.90mm/px · z∈[-42,+99]mm · 10 of 144 slices shown (2 of 3)]
[im 1/144]
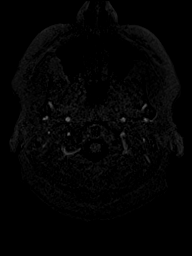
[im 16/144]
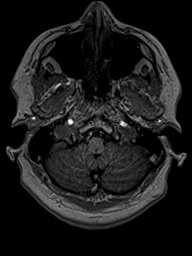
[im 32/144]
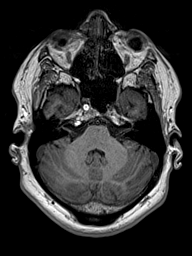
[im 48/144]
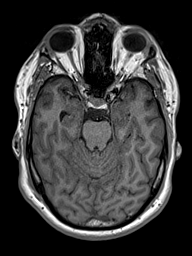
[im 64/144]
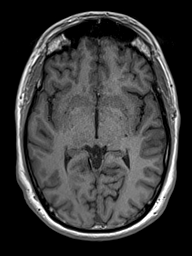
[im 80/144]
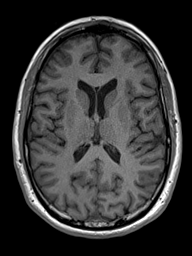
[im 96/144]
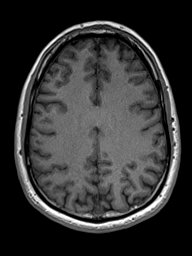
[im 112/144]
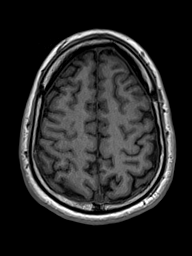
[im 128/144]
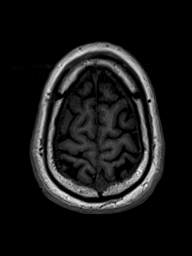
[im 144/144]
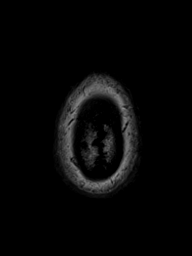

[Series 13: T2 post-contrast · coronal · 4.5mm · 0.36mm/px · 2 of 31 slices shown]
[im 1/31]
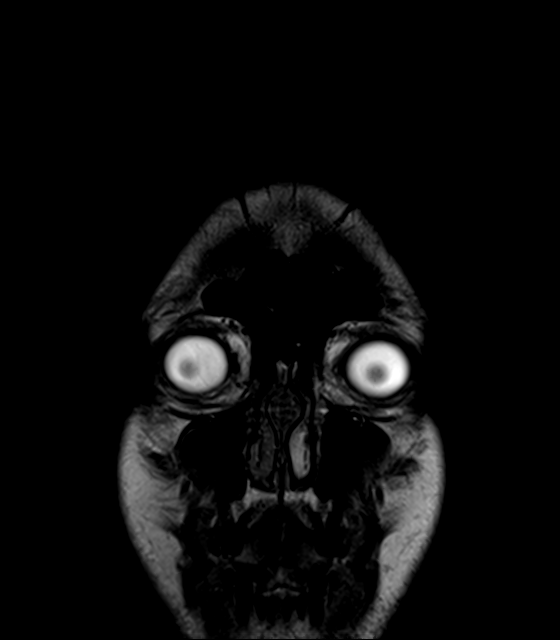
[im 31/31]
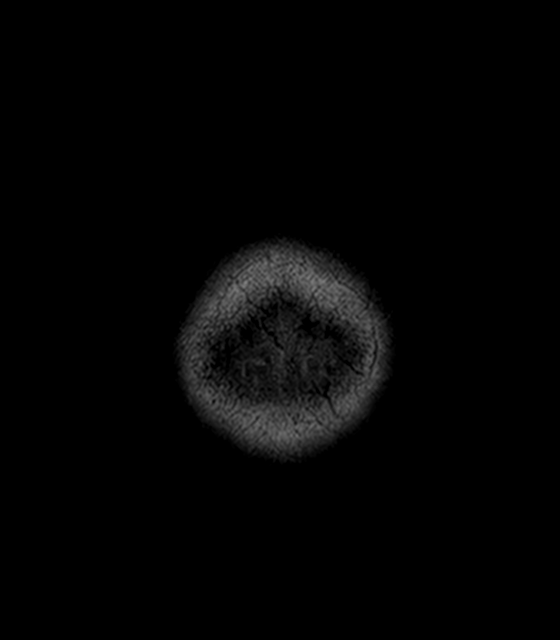

[Series 14: T1 · axial · 1.0mm · 0.90mm/px · z∈[-42,+99]mm · 10 of 144 slices shown (3 of 3)]
[im 1/144]
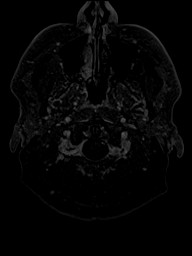
[im 16/144]
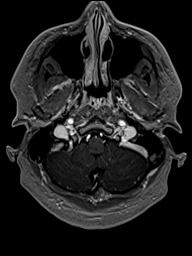
[im 32/144]
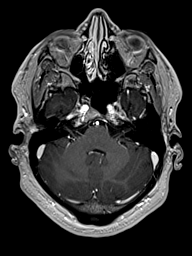
[im 48/144]
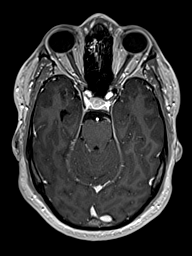
[im 64/144]
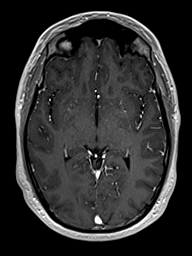
[im 80/144]
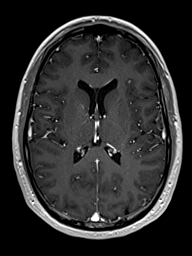
[im 96/144]
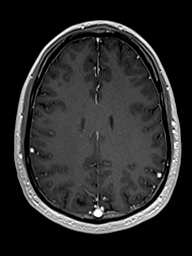
[im 112/144]
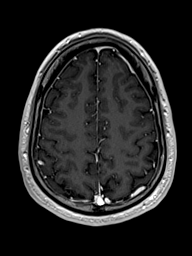
[im 128/144]
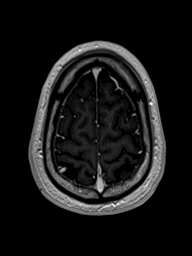
[im 144/144]
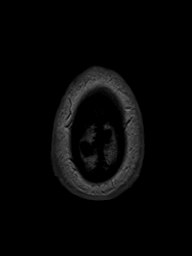

[Series 15: T1 post-contrast · coronal · 4.5mm · 0.72mm/px · 2 of 31 slices shown]
[im 1/31]
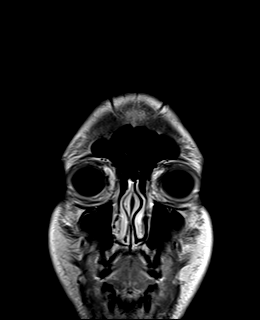
[im 31/31]
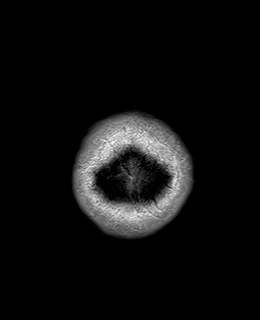

[48 of 48 positions shown; findings below may reference images not displayed]

FINDINGS: Brain: Brain has normal appearance without evidence of malformation,
atrophy, old or acute small or large vessel infarction, mass lesion,
hemorrhage, hydrocephalus or extra-axial collection. After contrast
administration, no abnormal enhancement occurs.

Vascular: Major vessels at the base of the brain show flow. Venous
sinuses appear patent.

Skull and upper cervical spine: Normal.

Sinuses/Orbits: Clear/normal.

Other: None significant.
IMPRESSION: Normal MRI of the brain.

## 2018-10-20 ENCOUNTER — Other Ambulatory Visit: Payer: Self-pay

## 2018-10-20 ENCOUNTER — Emergency Department (HOSPITAL_COMMUNITY)
Admission: EM | Admit: 2018-10-20 | Discharge: 2018-10-21 | Disposition: A | Discharge status: Home / Self Care | Payer: BLUE CROSS/BLUE SHIELD | Attending: Emergency Medicine | Admitting: Emergency Medicine

## 2018-10-20 ENCOUNTER — Encounter (HOSPITAL_COMMUNITY): Payer: Self-pay | Admitting: Emergency Medicine

## 2018-10-20 DIAGNOSIS — R4689 Other symptoms and signs involving appearance and behavior: Secondary | ICD-10-CM | POA: Diagnosis not present

## 2018-10-20 DIAGNOSIS — Z046 Encounter for general psychiatric examination, requested by authority: Secondary | ICD-10-CM | POA: Diagnosis not present

## 2018-10-20 DIAGNOSIS — Z79899 Other long term (current) drug therapy: Secondary | ICD-10-CM | POA: Diagnosis not present

## 2018-10-20 DIAGNOSIS — F319 Bipolar disorder, unspecified: Secondary | ICD-10-CM | POA: Diagnosis not present

## 2018-10-20 DIAGNOSIS — Z87891 Personal history of nicotine dependence: Secondary | ICD-10-CM | POA: Diagnosis not present

## 2018-10-20 DIAGNOSIS — F1020 Alcohol dependence, uncomplicated: Secondary | ICD-10-CM | POA: Diagnosis not present

## 2018-10-20 DIAGNOSIS — Y906 Blood alcohol level of 120-199 mg/100 ml: Secondary | ICD-10-CM | POA: Diagnosis not present

## 2018-10-20 DIAGNOSIS — F29 Unspecified psychosis not due to a substance or known physiological condition: Secondary | ICD-10-CM | POA: Diagnosis not present

## 2018-10-20 DIAGNOSIS — F1310 Sedative, hypnotic or anxiolytic abuse, uncomplicated: Secondary | ICD-10-CM | POA: Diagnosis not present

## 2018-10-20 LAB — CBC
HCT: 45.9 % (ref 39.0–52.0)
Hemoglobin: 15.3 g/dL (ref 13.0–17.0)
MCH: 31 pg (ref 26.0–34.0)
MCHC: 33.3 g/dL (ref 30.0–36.0)
MCV: 93.1 fL (ref 80.0–100.0)
PLATELETS: 380 10*3/uL (ref 150–400)
RBC: 4.93 MIL/uL (ref 4.22–5.81)
RDW: 12.3 % (ref 11.5–15.5)
WBC: 10.4 10*3/uL (ref 4.0–10.5)
nRBC: 0 % (ref 0.0–0.2)

## 2018-10-20 LAB — COMPREHENSIVE METABOLIC PANEL
ALBUMIN: 4.7 g/dL (ref 3.5–5.0)
ALT: 27 U/L (ref 0–44)
AST: 27 U/L (ref 15–41)
Alkaline Phosphatase: 61 U/L (ref 38–126)
Anion gap: 17 — ABNORMAL HIGH (ref 5–15)
BUN: 13 mg/dL (ref 6–20)
CALCIUM: 9.5 mg/dL (ref 8.9–10.3)
CO2: 20 mmol/L — AB (ref 22–32)
Chloride: 100 mmol/L (ref 98–111)
Creatinine, Ser: 1.14 mg/dL (ref 0.61–1.24)
GLUCOSE: 160 mg/dL — AB (ref 70–99)
Potassium: 3.6 mmol/L (ref 3.5–5.1)
Sodium: 137 mmol/L (ref 135–145)
Total Bilirubin: 0.6 mg/dL (ref 0.3–1.2)
Total Protein: 8.2 g/dL — ABNORMAL HIGH (ref 6.5–8.1)

## 2018-10-20 LAB — ETHANOL: ALCOHOL ETHYL (B): 143 mg/dL — AB (ref <10)

## 2018-10-20 LAB — SALICYLATE LEVEL

## 2018-10-20 LAB — RAPID URINE DRUG SCREEN, HOSP PERFORMED
AMPHETAMINES: NOT DETECTED
BARBITURATES: NOT DETECTED
BENZODIAZEPINES: POSITIVE — AB
Cocaine: NOT DETECTED
OPIATES: NOT DETECTED
Tetrahydrocannabinol: NOT DETECTED

## 2018-10-20 LAB — ACETAMINOPHEN LEVEL: Acetaminophen (Tylenol), Serum: <10 ug/mL — ABNORMAL LOW (ref 10–30)

## 2018-10-20 MED ORDER — HYDROXYZINE HCL 25 MG PO TABS: 25.0000 mg | ORAL_TABLET | Freq: Four times a day (QID) | ORAL | Status: DC | PRN

## 2018-10-20 MED ORDER — BENZTROPINE MESYLATE 1 MG PO TABS
1.0000 mg | ORAL_TABLET | Freq: Two times a day (BID) | ORAL | Status: DC
  Filled 2018-10-20 (×2): qty 1

## 2018-10-20 MED ORDER — ZIPRASIDONE MESYLATE 20 MG IM SOLR: 20.0000 mg | INTRAMUSCULAR | Status: DC | PRN

## 2018-10-20 MED ORDER — PROPRANOLOL HCL 10 MG PO TABS
10.0000 mg | ORAL_TABLET | Freq: Three times a day (TID) | ORAL | Status: DC
  Filled 2018-10-20 (×4): qty 1

## 2018-10-20 MED ORDER — GABAPENTIN 400 MG PO CAPS
400.0000 mg | ORAL_CAPSULE | Freq: Three times a day (TID) | ORAL | Status: DC | PRN
  Filled 2018-10-20: qty 1

## 2018-10-20 MED ORDER — TRAZODONE HCL 100 MG PO TABS: 200.0000 mg | ORAL_TABLET | Freq: Every evening | ORAL | Status: DC | PRN

## 2018-10-20 MED ORDER — FLUOXETINE HCL 20 MG PO CAPS
20.0000 mg | ORAL_CAPSULE | Freq: Every day | ORAL | Status: DC
  Filled 2018-10-20: qty 1

## 2018-10-20 MED ORDER — LORAZEPAM 2 MG/ML IJ SOLN: 2.0000 mg | INTRAMUSCULAR | Status: DC | PRN

## 2018-10-20 NOTE — ED Notes (Signed)
Bed: [id number] Expected date:  Expected time:  Means of arrival:  Comments: Combative IVC with [employer]

## 2018-10-20 NOTE — ED Triage Notes (Signed)
Pt presents IVC by mother. Per [employer] patient is combative and accompanied by 5 police officers. Security at bedside. Per papers, "Respondent has been diagnosed with Bipolar 1 and severe depression. He has been prescribed xanax and has taken 75 pills in the last 10 days and also drinks alcohol with it. Has been committed in June of 2019. Says he has a billion dollars in a [place] bank account, the FBI is wiping parents mind and has transferred $250,000 to an account to look after his grandmother. Respondent curses at his family, hits walls, screams and yells when anyone talks to him and was very angry the parents have taken his car keys." Pt refusing all care at this time. Patient not combative on arrival but verbally aggressive. Per [employer], patient barricaded himself in the bathroom in the back bedroom. Patient combative on scene, had to be carried to the car.

## 2018-10-20 NOTE — ED Notes (Signed)
Pt refusing medication. Effie ShyWentz, MD notified

## 2018-10-20 NOTE — ED Provider Notes (Signed)
[facility]-EMERGENCY DEPT Provider Note   CSN: [id number] Arrival date & time: 10/20/18  2128     History   Chief Complaint Chief Complaint  Patient presents with  . IVC    HPI Maurice [name] is a 32 y.o. male.  HPI   He presents under involuntary commitment, for combative behavior, by law enforcement.  Last had psychiatric hospitalization in June 2019.  Known history of polysubstance abuse.  He reportedly took 75 Xanax tablets in the last 10 days and drink some alcohol with them.  Commitment paperwork taken out by family members who report that patient claims to have money in a [place] Bank account, that the FBI is wiping his parents mind, is violent and aggressive, and stealing things from his parents.  When law enforcement went to his home he was barricaded in the bathroom, and he had to be carried out, fighting  enforcement.  He refuses to give history.  Past Medical History:  Diagnosis Date  . Anxiety   . Substance abuse [facility])     Patient Active Problem List   Diagnosis Date Noted  . Acute psychosis (HCC)   . Bipolar I disorder, current or most recent episode manic, with psychotic features (HCC) 04/21/2018  . Suicidal ideations   . Polysubstance (including opioids) dependence with physiological dependence (HCC) 04/02/2018  . Substance-induced anxiety disorder (HCC) 04/02/2018  . Delusions (HCC)     History reviewed. No pertinent surgical history.      Home Medications    Prior to Admission medications   Medication Sig Start Date End Date Taking? Authorizing Provider  benztropine (COGENTIN) 1 MG tablet Take 1 tablet (1 mg total) by mouth 2 (two) times daily. For prevention of drug induced tremors 05/17/18   Armandina Stammer I, NP  FLUoxetine (PROZAC) 20 MG capsule Take 1 capsule (20 mg total) by mouth daily. For depression 05/18/18   Armandina Stammer I, NP  gabapentin (NEURONTIN) 400 MG capsule Take 1 capsule (400 mg total) by mouth 3 (three) times  daily as needed (anxiety). (agitation) 05/17/18   Armandina Stammer I, NP  hydrOXYzine (ATARAX/VISTARIL) 25 MG tablet Take 1 tablet (25 mg total) by mouth every 6 (six) hours as needed (mild/moderate anxiety, insomnia). 05/17/18   Armandina Stammer I, NP  propranolol (INDERAL) 10 MG tablet Take 1 tablet (10 mg total) by mouth 3 (three) times daily. For anxiety 05/17/18   Armandina Stammer I, NP  traZODone (DESYREL) 100 MG tablet Take 2 tablets (200 mg total) by mouth at bedtime as needed for sleep. 05/17/18   Sanjuana Kava, NP    Family History No family history on file.  Social History Social History   Tobacco Use  . Smoking status: Former Games developer  . Smokeless tobacco: Never Used  Substance Use Topics  . Alcohol use: Not Currently    Alcohol/week: 0.0 standard drinks  . Drug use: No     Allergies   Other   Review of Systems Review of Systems  All other systems reviewed and are negative.    Physical Exam Updated Vital Signs BP 138/81 (BP Location: Left Arm)   Pulse (!) 136   Temp 98.9 F (37.2 C) (Oral)   Resp 16   SpO2 98%   Physical Exam Vitals signs and nursing note reviewed.  Constitutional:      Appearance: He is well-developed.  HENT:     Head: Normocephalic and atraumatic.     Right Ear: External ear normal.  Left Ear: External ear normal.  Eyes:     Conjunctiva/sclera: Conjunctivae normal.     Pupils: Pupils are equal, round, and reactive to light.  Neck:     Musculoskeletal: Normal range of motion and neck supple.     Trachea: Phonation normal.  Cardiovascular:     Comments: Tachycardic Pulmonary:     Effort: Pulmonary effort is normal.  Abdominal:     Palpations: Abdomen is soft.     Tenderness: There is abdominal tenderness.  Musculoskeletal: Normal range of motion.        General: No swelling.  Skin:    General: Skin is warm and dry.  Neurological:     Mental Status: He is alert.     Motor: No abnormal muscle tone.     Coordination: Coordination normal.      Comments: No dysarthria or aphasia.  Psychiatric:     Comments: Angry, agitated.      ED Treatments / Results  Labs (all labs ordered are listed, but only abnormal results are displayed) Labs Reviewed  COMPREHENSIVE METABOLIC PANEL - Abnormal; Notable for the following components:      Result Value   CO2 20 (*)    Glucose, Bld 160 (*)    Total Protein 8.2 (*)    Anion gap 17 (*)    All other components within normal limits  ETHANOL - Abnormal; Notable for the following components:   Alcohol, Ethyl (B) 143 (*)    All other components within normal limits  ACETAMINOPHEN LEVEL - Abnormal; Notable for the following components:   Acetaminophen (Tylenol), Serum <10 (*)    All other components within normal limits  RAPID URINE DRUG SCREEN, HOSP PERFORMED - Abnormal; Notable for the following components:   Benzodiazepines POSITIVE (*)    All other components within normal limits  SALICYLATE LEVEL  CBC    EKG None  Radiology No results found.  Procedures Procedures (including critical care time)  Medications Ordered in ED Medications  ziprasidone (GEODON) injection 20 mg (has no administration in time range)  LORazepam (ATIVAN) injection 2 mg (has no administration in time range)  benztropine (COGENTIN) tablet 1 mg (has no administration in time range)  FLUoxetine (PROZAC) capsule 20 mg (has no administration in time range)  gabapentin (NEURONTIN) capsule 400 mg (has no administration in time range)  hydrOXYzine (ATARAX/VISTARIL) tablet 25 mg (has no administration in time range)  propranolol (INDERAL) tablet 10 mg (has no administration in time range)  traZODone (DESYREL) tablet 200 mg (has no administration in time range)     Initial Impression / Assessment and Plan / ED Course  I have reviewed the triage vital signs and the nursing notes.  Pertinent labs & imaging results that were available during my care of the patient were reviewed by me and considered in my  medical decision making (see chart for details).  Clinical Course as of Oct 20 2310  Thu Oct 20, 2018  2156 He presents under involuntary commitment, first opinion paperwork signed by me.   [EW]  2309 Normal  Acetaminophen level(!) [EW]  2309 Elevated  Ethanol(!) [EW]  2309 Normal except benzodiazepine present   [EW]  2309 Normal  Salicylate level [EW]  2309 Normal except CO2 low, glucose high, total protein high  Comprehensive metabolic panel(!) [EW]  2309 Normal  cbc [EW]  2310 At this point the patient is medically cleared for treatment by psychiatry.   [EW]    Clinical Course User Index [EW] Effie Shy,  Mechele CollinElliott, MD     Patient Vitals for the past 24 hrs:  BP Temp Temp src Pulse Resp SpO2  10/20/18 2132 138/81 98.9 F (37.2 C) Oral (!) 136 16 98 %   TTS Consultation  Medical Decision Making: Recurrent psychosis with alcohol intoxication.  Patient under involuntary commitment.  CRITICAL CARE-no Performed by: Mancel BaleElliott Kashawn Dirr  Nursing Notes Reviewed/ Care Coordinated Applicable Imaging Reviewed Interpretation of Laboratory Data incorporated into ED treatment  Plan-as per TTS in conjunction with oncoming provider team  Final Clinical Impressions(s) / ED Diagnoses   Final diagnoses:  Psychosis, unspecified psychosis type [facility](HCC)    ED Discharge Orders    None       Mancel BaleWentz, Tipton Ballow, MD 10/20/18 2312

## 2018-10-21 DIAGNOSIS — F1310 Sedative, hypnotic or anxiolytic abuse, uncomplicated: Secondary | ICD-10-CM

## 2018-10-21 MED ORDER — GABAPENTIN 300 MG PO CAPS: 300.0000 mg | ORAL_CAPSULE | Freq: Three times a day (TID) | ORAL | Status: DC

## 2018-10-21 MED ORDER — GABAPENTIN 300 MG PO CAPS: 300.0000 mg | ORAL_CAPSULE | Freq: Three times a day (TID) | ORAL | 0 refills | Status: AC

## 2018-10-21 NOTE — ED Notes (Signed)
[facility] Expected date:  Expected time:  Means of arrival:  Comments: Pfeffer

## 2018-10-21 NOTE — ED Notes (Signed)
TTS at bedside. 

## 2018-10-21 NOTE — ED Notes (Signed)
Patient is very rude and uncooperative.  Patient refuses to take any medication unless its his xanax.

## 2018-10-21 NOTE — ED Notes (Signed)
Pt is irritable and uncooperative.  He does not want to talk or participate in assessment and he is refusing his medication.  Pt is very disheveled.  Pt denies S/I, H/I , and AVH.

## 2018-10-21 NOTE — ED Notes (Addendum)
Pt under IVC, presents for eval after taking 75 Xanax over the last 10 days and Alcohol ingestion.  Grandiose ideations of a [employer] Dollars in a [place] bank account.   Pt curses at family, hit walls, screams and yells when everyone talks to him.  Pt awake, alert & responsive, no distress noted, monitoring for safety, Q 15 min checks in effect.

## 2018-10-21 NOTE — Discharge Instructions (Signed)
For your behavioral health needs, you are advised to continue treatment with Rupinder Kaur, MD: ° °     Rupinder Kaur, MD °     [address] °     [place], [place] [address] °     [phone]

## 2018-10-21 NOTE — BHH Suicide Risk Assessment (Signed)
[facility]   Principal Problem: Benzodiazepine abuse [facility](HCC) Discharge Diagnoses: Principal Problem:   Benzodiazepine abuse (HCC)   Total Time spent with patient: 30 minutes  [name] reports that he is not abusing Xanax. He reports taking his medication as prescribed by his psychiatrist although per IVC paperwork, he took 7075 Xanax in the past 10 days. He denies problematic alcohol use although this information contradicts the information on his IVC paperwork. He was reportedly drinking alcohol while using Xanax. UDS was positive for benzodiazepines and BAL was 143 on admission. He denies SI, HI or AVH. He lives with his parents and reports that he can return home at discharge. He does not appear to understand the seriousness of his situation and reports, "I just want to get some sleep."   Musculoskeletal: Strength & Muscle Tone: within normal limits Gait & Station: normal Patient leans: N/A  Psychiatric Specialty Exam: Review of Systems  Psychiatric/Behavioral: Positive for substance abuse. Negative for hallucinations and suicidal ideas.  All other systems reviewed and are negative.   Blood pressure (!) 151/91, pulse (!) 112, temperature 98.9 F (37.2 C), resp. rate 16, SpO2 98 %.There is no height or weight on file to calculate BMI.  General Appearance: Fairly Groomed, young, Caucasian male, wearing paper hospital scrubs and sitting in bed. NAD.   Eye Contact::  Good  Speech:  Clear and Coherent and Normal Rate  Volume:  Normal  Mood:  Euthymic  Affect:  Congruent  Thought Process:  Goal Directed, Linear and Descriptions of Associations: Intact  Orientation:  Full (Time, Place, and Person)  Thought Content:  Logical  Suicidal Thoughts:  No  Homicidal Thoughts:  No  Memory:  Immediate;   Good Recent;   Good Remote;   Good  Judgement:  Poor  Insight:  Poor  Psychomotor Activity:  Normal  Concentration:  Good  Recall:  Good  Fund of Knowledge:Good   Language: Good  Akathisia:  No  Handed:  Right  AIMS (if indicated):   N/A  Assets:  Communication Skills Housing Physical Health Resilience Social Support  Sleep:   N/A  Cognition: WNL  ADL's:  Intact   Mental Status Per Nursing Assessment::   On Admission:    "Pt presents quiet/awake in scrubs with logical/coherent speech. Pt's eye contact was fair. Pt's mood was pleasant. Pt's affect was flat. Pt's thought process was coherent/relevant. Pt's judgement was partial. Pt was oriented x3. Pt's concentration was normal. Pt's insight was poor. Pt's impulse control was fair. Pt reported, [facility] he could contract for safety."  Demographic Factors:  Male and Caucasian  Loss Factors: NA  Historical Factors: Impulsivity  Risk Reduction Factors:   Living with another person, especially a relative, Positive social support and Positive therapeutic relationship  Continued Clinical Symptoms:  More than one psychiatric diagnosis Previous Psychiatric Diagnoses and Treatments  Cognitive Features That Contribute To Risk:  None    Suicide Risk:  Minimal: No identifiable suicidal ideation.  Patients presenting with no risk factors but with morbid ruminations; may be classified as minimal risk based on the severity of the depressive symptoms  Assessment:  Maurice [name] is a 32 y.o. male who was admitted with combative and disorganized behavior in the setting of substance intoxication. His mental status is clear today. He minimizes his problematic alcohol and benzodiazepine use. He denies SI, HI or AVH. Peer support consulted to provide substance abuse treatment resources. He does not warrant inpatient psychiatric hospitalization  at this time.   Plan Of Care/Follow-up recommendations:  -Continue psychotropic medications as prescribed.  -Follow up with outpatient provider. -Discharge home.   Cherly Beach, DO 10/21/2018, 12:39 PM

## 2018-10-21 NOTE — BH Assessment (Addendum)
[facility]  Per Juanetta BeetsJacqueline Norman, DO, this pt does not require psychiatric hospitalization at this time.  Pt presents under IVC initiated by pt's mother, which Dr Sharma CovertNorman has rescinded.  [facility] with recommendation to continue treatment with Milagros Evenerupinder Kaur, MD.  This has been included in pt's discharge instructions.  Pt would also benefit from seeing Peer Support Specialists; they will be asked to speak to pt.  Pt's nurse, Kendal Hymendie, has been notified.  Doylene Canninghomas Liadan Guizar, MA Triage Specialist [phone]

## 2018-10-21 NOTE — ED Notes (Signed)
Per TTS, pt will be reassessed in the morning

## 2018-10-21 NOTE — ED Notes (Signed)
Pt offered collateral including that patient has been on xanax x 5 years and they are concerned that he could withdraw.  Pt has also been binge drinking vodka.  Provider notified and order for gabapentin received.

## 2018-10-21 NOTE — BH Assessment (Addendum)
Assessment Note  Maurice [name] is an 32 y.o. male, who presents involuntary and unaccompanied to [facility]. Clinician asked the pt, "what brought you to the hospital?" Pt reported, "I don't know, I was at my house having a drink and I heard voices outside so I ran in the bathroom." Pt reported, "it was the police." Pt denies, SI, HI, AVH, self-injurious behaviors and access to weapons.   Pt was IVC'd by his mother. Per IVC paperwork: "Respondent has been diagnosed with Bipolar 1 and Severe Depression. He has been prescribed Xanax and has taken 75 pills in the last 10 days and also drinks alcohol with it. Has been committed in June of 2019, Says he has a billion dollars in a [place] bank account, the FBI is wiping parents mind and has transferred $250,000 to an account to look after his grandmother. Respondent curses at his family, hits walls and yells when anyone talks to him and was very angry the parents have taken his car keys."   Pt denies abuse. Pt denies, substance use. Pt's UDS is positive for benzodiazepines. Per chart, pt's UDS is 143 at 2137. Pt reported, being linked to Dr. Evelene Croon for medication management. Pt reports, taking medications as prescribed. Pt has a previous inpatient admission to [facility].   Pt presents quiet/awake in scrubs with logical/coherent speech. Pt's eye contact was fair. Pt's mood was pleasant. Pt's affect was flat. Pt's thought process was coherent/relevant. Pt's judgement was partial. Pt was oriented x3. Pt's concentration was normal. Pt's insight was poor. Pt's impulse control was fair. Pt reported, if discharged from [facility] he could contract for safety.   Diagnosis: Bipolar 1 Disorder, current, manic with psychosis.                    Alcohol use Disorder, severe.  Past Medical History:  Past Medical History:  Diagnosis Date  . Anxiety   . Substance abuse (HCC)     History reviewed. No pertinent surgical history.  Family History: No family history on  file.  Social History:  reports that he has quit smoking. He has never used smokeless tobacco. He reports previous alcohol use. He reports that he does not use drugs.  Additional Social History:  Alcohol / Drug Use Pain Medications: See MAR Prescriptions: See MAR Over the Counter: See MAR History of alcohol / drug use?: Yes(Pt denies.) Longest period of sobriety (when/how long): Pt reported, not smoking a cigarettes in a month.  Substance #1 Name of Substance 1: Benzidiazepines.  1 - Age of First Use: UTA 1 - Amount (size/oz): Pt's UDS is positive for benzodiazepines.  1 - Frequency: UTA 1 - Duration: UTA 1 - Last Use / Amount: UTA Substance #2 Name of Substance 2: Alcohol. 2 - Age of First Use: UTA 2 - Amount (size/oz): Per chart, pt's UDS is 143 at 2137. 2 - Frequency: UTA 2 - Duration: UTA 2 - Last Use / Amount: UTA  CIWA: CIWA-Ar BP: (!) 147/77 Pulse Rate: (!) 109 COWS:    Allergies:  Allergies  Allergen Reactions  . Other Nausea And Vomiting    Red meat products    Home Medications: (Not in a hospital admission)   OB/GYN Status:  No LMP for male patient.  General Assessment Data Location of Assessment: [facility] ED TTS Assessment: In system Is this a Tele or Face-to-Face Assessment?: Face-to-Face Is this an Initial Assessment or a Re-assessment for this encounter?: Initial Assessment Patient Accompanied by:: N/A Language Other  than English: No Living Arrangements: Other (Comment)(Alone.) What gender do you identify as?: Male Marital status: Single Living Arrangements: Alone Can pt return to current living arrangement?: Yes Admission Status: Involuntary Petitioner: Family member Is patient capable of signing voluntary admission?: No Referral Source: Other([employer].) Insurance type: BCBS.     Crisis Care Plan Living Arrangements: Alone Legal Guardian: Other:(Self.) Name of Psychiatrist: Dr. Evelene Croon. Name of Therapist: NA  Education Status Is patient currently  in school?: No Is the patient employed, unemployed or receiving disability?: Employed  Risk to self with the past 6 months Suicidal Ideation: No(Pt denies. ) Has patient been a risk to self within the past 6 months prior to admission? : No Suicidal Intent: No Has patient had any suicidal intent within the past 6 months prior to admission? : No Is patient at risk for suicide?: No Suicidal Plan?: No(Pt denies. ) Has patient had any suicidal plan within the past 6 months prior to admission? : Yes Access to Means: No What has been your use of drugs/alcohol within the last 12 months?: Benzodiazepines and alcohol.  Previous Attempts/Gestures: No How many times?: 0 Other Self Harm Risks: Drug use.  Triggers for Past Attempts: None known Intentional Self Injurious Behavior: None Family Suicide History: No Recent stressful life event(s): Other (Comment)(Pt denies. ) Persecutory voices/beliefs?: No Depression: No(Pt denies. ) Substance abuse history and/or treatment for substance abuse?: Yes Suicide prevention information given to non-admitted patients: Not applicable  Risk to Others within the past 6 months Homicidal Ideation: No(Pt denies. ) Does patient have any lifetime risk of violence toward others beyond the six months prior to admission? : No(Pt denies. ) Thoughts of Harm to Others: No Current Homicidal Intent: No Current Homicidal Plan: No Access to Homicidal Means: No Identified Victim: NA History of harm to others?: No Assessment of Violence: None Noted Violent Behavior Description: NA Does patient have access to weapons?: No Criminal Charges Pending?: No Does patient have a court date: No Is patient on probation?: No  Psychosis Hallucinations: Auditory, Visual(Per IVC however pt denies.) Delusions: Grandiose(Per IVC however pt denies.)  Mental Status Report Appearance/Hygiene: In scrubs Eye Contact: Fair Motor Activity: Unremarkable Speech: Logical/coherent Level  of Consciousness: Quiet/awake Mood: Pleasant Affect: Flat Anxiety Level: Minimal Thought Processes: Coherent, Relevant Judgement: Partial Orientation: Person, Place, Time Obsessive Compulsive Thoughts/Behaviors: None  Cognitive Functioning Concentration: Normal Memory: Recent Impaired Is patient IDD: No Insight: Poor Impulse Control: Fair Appetite: Good Sleep: Decreased Total Hours of Sleep: 5 Vegetative Symptoms: None  ADLScreening [facility] Assessment Services) Patient's cognitive ability adequate to safely complete daily activities?: Yes Patient able to express need for assistance with ADLs?: Yes Independently performs ADLs?: Yes (appropriate for developmental age)  Prior Inpatient Therapy Prior Inpatient Therapy: Yes Prior Therapy Dates: 04/2018. Prior Therapy Facilty/Provider(s): [facility]. Reason for Treatment: AVH, substance use.   Prior Outpatient Therapy Prior Outpatient Therapy: Yes Prior Therapy Dates: Current. Prior Therapy Facilty/Provider(s): Dr. Evelene Croon. Reason for Treatment: Medication management.  Does patient have an ACCT team?: No Does patient have Intensive In-House Services?  : No Does patient have [facility] services? : No Does patient have P4CC services?: No  ADL Screening (condition at time of admission) Patient's cognitive ability adequate to safely complete daily activities?: Yes Is the patient deaf or have difficulty hearing?: No Does the patient have difficulty seeing, even when wearing glasses/contacts?: Yes(Pt wears glasses. ) Does the patient have difficulty concentrating, remembering, or making decisions?: Yes Patient able to express need for assistance with ADLs?: Yes  Does the patient have difficulty dressing or bathing?: No Independently performs ADLs?: Yes (appropriate for developmental age) Does the patient have difficulty walking or climbing stairs?: No Weakness of Legs: None Weakness of Arms/Hands: Left(Pt reported, pain in left shoulder.  )  Home Assistive Devices/Equipment Home Assistive Devices/Equipment: Eyeglasses    Abuse/Neglect Assessment (Assessment to be complete while patient is alone) Abuse/Neglect Assessment Can Be Completed: Yes Physical Abuse: Denies(Pt denies.) Verbal Abuse: Denies(Pt denies.) Sexual Abuse: Denies(Pt denies. ) Exploitation of patient/patient's resources: Denies(Pt denies. ) Self-Neglect: Denies(Pt denies. )     Advance Directives (For Healthcare) Does Patient Have a Medical Advance Directive?: No          Disposition: Donell SievertSpencer Simon, PA recommends pt overnight observation for safety/stabilization and re-evaluation. Disposition discussed with Melvenia BeamShari, PA and Maralyn SagoSarah, RN.    Disposition Initial Assessment Completed for this Encounter: Yes  On Site Evaluation by: Redmond Pullingreylese D Paarth Cropper, MS, LPC, CRC. Reviewed with Physician:  Melvenia BeamShari, PA and Donell SievertSpencer Simon, PA.  Redmond Pullingreylese D Sherolyn Trettin 10/21/2018 1:02 AM

## 2018-10-21 NOTE — ED Notes (Signed)
Patient discharged reluctantly.  After refusing meds, peer support, or visitors patient did not want to leave his bed.  Security present.   Pt refused to sign his discharge or allow us to take his vitals.
